# Patient Record
Sex: Female | Born: 1975 | Race: White | Hispanic: No | Marital: Married | Smoking: Never smoker
Health system: Southern US, Community
[De-identification: ages and names within clinical notes are randomized; demographics above are authoritative.]

## PROBLEM LIST (undated history)

## (undated) DIAGNOSIS — G43509 Persistent migraine aura without cerebral infarction, not intractable, without status migrainosus: Secondary | ICD-10-CM

## (undated) DIAGNOSIS — Z8669 Personal history of other diseases of the nervous system and sense organs: Secondary | ICD-10-CM

## (undated) DIAGNOSIS — T7840XA Allergy, unspecified, initial encounter: Secondary | ICD-10-CM

## (undated) DIAGNOSIS — I10 Essential (primary) hypertension: Secondary | ICD-10-CM

## (undated) DIAGNOSIS — F419 Anxiety disorder, unspecified: Secondary | ICD-10-CM

## (undated) HISTORY — DX: Anxiety disorder, unspecified: F41.9

## (undated) HISTORY — DX: Essential (primary) hypertension: I10

## (undated) HISTORY — DX: Allergy, unspecified, initial encounter: T78.40XA

## (undated) HISTORY — DX: Persistent migraine aura without cerebral infarction, not intractable, without status migrainosus: G43.509

---

## 1998-04-25 ENCOUNTER — Other Ambulatory Visit: Admission: RE | Admit: 1998-04-25 | Discharge: 1998-04-25 | Payer: Self-pay | Admitting: Gynecology

## 1999-01-15 ENCOUNTER — Inpatient Hospital Stay (HOSPITAL_COMMUNITY): Admission: AD | Admit: 1999-01-15 | Discharge: 1999-01-19 | Payer: Self-pay | Admitting: *Deleted

## 1999-04-03 ENCOUNTER — Other Ambulatory Visit: Admission: RE | Admit: 1999-04-03 | Discharge: 1999-04-03 | Payer: Self-pay | Admitting: Obstetrics and Gynecology

## 2000-04-24 ENCOUNTER — Other Ambulatory Visit: Admission: RE | Admit: 2000-04-24 | Discharge: 2000-04-24 | Payer: Self-pay | Admitting: Obstetrics and Gynecology

## 2001-04-03 ENCOUNTER — Other Ambulatory Visit: Admission: RE | Admit: 2001-04-03 | Discharge: 2001-04-03 | Payer: Self-pay | Admitting: Obstetrics and Gynecology

## 2002-02-13 ENCOUNTER — Emergency Department (HOSPITAL_COMMUNITY): Admission: EM | Admit: 2002-02-13 | Discharge: 2002-02-13 | Payer: Self-pay | Admitting: Emergency Medicine

## 2002-02-13 ENCOUNTER — Encounter: Payer: Self-pay | Admitting: Emergency Medicine

## 2002-04-06 ENCOUNTER — Other Ambulatory Visit: Admission: RE | Admit: 2002-04-06 | Discharge: 2002-04-06 | Payer: Self-pay | Admitting: Obstetrics and Gynecology

## 2003-04-15 ENCOUNTER — Other Ambulatory Visit: Admission: RE | Admit: 2003-04-15 | Discharge: 2003-04-15 | Payer: Self-pay | Admitting: Obstetrics and Gynecology

## 2003-10-07 ENCOUNTER — Emergency Department (HOSPITAL_COMMUNITY): Admission: EM | Admit: 2003-10-07 | Discharge: 2003-10-07 | Payer: Self-pay | Admitting: Emergency Medicine

## 2004-01-26 ENCOUNTER — Ambulatory Visit (HOSPITAL_COMMUNITY): Admission: RE | Admit: 2004-01-26 | Discharge: 2004-01-26 | Payer: Self-pay | Admitting: *Deleted

## 2004-07-12 ENCOUNTER — Other Ambulatory Visit: Admission: RE | Admit: 2004-07-12 | Discharge: 2004-07-12 | Payer: Self-pay | Admitting: Obstetrics and Gynecology

## 2005-02-01 ENCOUNTER — Emergency Department (HOSPITAL_COMMUNITY): Admission: EM | Admit: 2005-02-01 | Discharge: 2005-02-01 | Payer: Self-pay | Admitting: Emergency Medicine

## 2005-11-02 ENCOUNTER — Ambulatory Visit (HOSPITAL_COMMUNITY): Admission: RE | Admit: 2005-11-02 | Discharge: 2005-11-02 | Payer: Self-pay | Admitting: Emergency Medicine

## 2008-03-10 ENCOUNTER — Other Ambulatory Visit: Payer: Self-pay | Admitting: Emergency Medicine

## 2008-03-10 ENCOUNTER — Inpatient Hospital Stay (HOSPITAL_COMMUNITY): Admission: AD | Admit: 2008-03-10 | Discharge: 2008-03-10 | Payer: Self-pay | Admitting: Obstetrics and Gynecology

## 2008-04-06 ENCOUNTER — Ambulatory Visit: Payer: Self-pay | Admitting: Obstetrics and Gynecology

## 2008-04-06 ENCOUNTER — Inpatient Hospital Stay (HOSPITAL_COMMUNITY): Admission: AD | Admit: 2008-04-06 | Discharge: 2008-04-06 | Payer: Self-pay | Admitting: Obstetrics and Gynecology

## 2008-05-04 ENCOUNTER — Encounter: Admission: RE | Admit: 2008-05-04 | Discharge: 2008-05-04 | Payer: Self-pay | Admitting: Obstetrics and Gynecology

## 2008-05-14 ENCOUNTER — Inpatient Hospital Stay (HOSPITAL_COMMUNITY): Admission: AD | Admit: 2008-05-14 | Discharge: 2008-05-14 | Payer: Self-pay | Admitting: Obstetrics and Gynecology

## 2008-05-15 ENCOUNTER — Inpatient Hospital Stay (HOSPITAL_COMMUNITY): Admission: AD | Admit: 2008-05-15 | Discharge: 2008-05-15 | Payer: Self-pay | Admitting: Obstetrics and Gynecology

## 2008-06-02 ENCOUNTER — Inpatient Hospital Stay (HOSPITAL_COMMUNITY): Admission: AD | Admit: 2008-06-02 | Discharge: 2008-06-02 | Payer: Self-pay | Admitting: Obstetrics and Gynecology

## 2008-06-14 ENCOUNTER — Inpatient Hospital Stay (HOSPITAL_COMMUNITY): Admission: AD | Admit: 2008-06-14 | Discharge: 2008-06-18 | Payer: Self-pay | Admitting: Obstetrics and Gynecology

## 2008-06-15 ENCOUNTER — Encounter (INDEPENDENT_AMBULATORY_CARE_PROVIDER_SITE_OTHER): Payer: Self-pay | Admitting: Obstetrics & Gynecology

## 2008-06-20 ENCOUNTER — Inpatient Hospital Stay (HOSPITAL_COMMUNITY): Admission: AD | Admit: 2008-06-20 | Discharge: 2008-06-20 | Payer: Self-pay | Admitting: Obstetrics and Gynecology

## 2008-06-21 ENCOUNTER — Inpatient Hospital Stay (HOSPITAL_COMMUNITY): Admission: AD | Admit: 2008-06-21 | Discharge: 2008-06-22 | Payer: Self-pay | Admitting: Obstetrics and Gynecology

## 2008-06-21 ENCOUNTER — Encounter: Admission: RE | Admit: 2008-06-21 | Discharge: 2008-06-25 | Payer: Self-pay | Admitting: Obstetrics and Gynecology

## 2010-04-17 LAB — CBC
HCT: 30.6 % — ABNORMAL LOW (ref 36.0–46.0)
HCT: 36.8 % (ref 36.0–46.0)
Hemoglobin: 10.8 g/dL — ABNORMAL LOW (ref 12.0–15.0)
Hemoglobin: 11.3 g/dL — ABNORMAL LOW (ref 12.0–15.0)
Hemoglobin: 12.8 g/dL (ref 12.0–15.0)
MCHC: 34.7 g/dL (ref 30.0–36.0)
MCHC: 34.8 g/dL (ref 30.0–36.0)
MCHC: 35.2 g/dL (ref 30.0–36.0)
MCV: 84 fL (ref 78.0–100.0)
MCV: 84.4 fL (ref 78.0–100.0)
MCV: 84.9 fL (ref 78.0–100.0)
Platelets: 148 10*3/uL — ABNORMAL LOW (ref 150–400)
RBC: 3.89 MIL/uL (ref 3.87–5.11)
RDW: 16.6 % — ABNORMAL HIGH (ref 11.5–15.5)
RDW: 16.6 % — ABNORMAL HIGH (ref 11.5–15.5)
WBC: 7.4 10*3/uL (ref 4.0–10.5)

## 2010-04-20 LAB — COMPREHENSIVE METABOLIC PANEL
ALT: 17 U/L (ref 0–35)
AST: 35 U/L (ref 0–37)
Albumin: 2.8 g/dL — ABNORMAL LOW (ref 3.5–5.2)
Alkaline Phosphatase: 43 U/L (ref 39–117)
CO2: 26 mEq/L (ref 19–32)
Chloride: 107 mEq/L (ref 96–112)
Creatinine, Ser: 0.4 mg/dL (ref 0.4–1.2)
GFR calc Af Amer: >60 mL/min (ref >60)
GFR calc non Af Amer: >60 mL/min (ref >60)
Potassium: 4.3 mEq/L (ref 3.5–5.1)
Total Bilirubin: 0.9 mg/dL (ref 0.3–1.2)

## 2010-04-20 LAB — CBC
MCV: 84.5 fL (ref 78.0–100.0)
Platelets: 177 10*3/uL (ref 150–400)
RBC: 4.02 MIL/uL (ref 3.87–5.11)
WBC: 9.5 10*3/uL (ref 4.0–10.5)

## 2010-04-20 LAB — PROTIME-INR: Prothrombin Time: 14.5 seconds (ref 11.6–15.2)

## 2010-04-20 LAB — URINALYSIS, ROUTINE W REFLEX MICROSCOPIC
Glucose, UA: NEGATIVE mg/dL
Hgb urine dipstick: NEGATIVE
Ketones, ur: NEGATIVE mg/dL
Protein, ur: NEGATIVE mg/dL
pH: 7 (ref 5.0–8.0)

## 2010-04-20 LAB — DIFFERENTIAL
Basophils Absolute: 0 10*3/uL (ref 0.0–0.1)
Basophils Relative: 0 % (ref 0–1)
Eosinophils Absolute: 0.1 10*3/uL (ref 0.0–0.7)
Eosinophils Relative: 1 % (ref 0–5)
Monocytes Absolute: 0.5 10*3/uL (ref 0.1–1.0)

## 2010-04-20 LAB — ABO/RH: ABO/RH(D): AB NEG

## 2010-04-20 LAB — TYPE AND SCREEN: ABO/RH(D): AB NEG

## 2010-05-23 NOTE — Discharge Summary (Signed)
Kylie Galloway, Kylie Galloway                 ACCOUNT NO.:  [id number]   MEDICAL RECORD NO.:  [id number]          PATIENT TYPE:  INP   LOCATION:  9111                          FACILITY:  [facility]   PHYSICIAN:  [name], [name]   DATE OF BIRTH:  1975-05-01   DATE OF ADMISSION:  06/14/2008  DATE OF DISCHARGE:  06/18/2008                               DISCHARGE SUMMARY   ADMITTING DIAGNOSES:  1. Intrauterine pregnancy at 37-4/7 weeks estimated gestational age.  2. Chronic hypertension, currently on labetalol.  3. Gestational diabetes, diet controlled for large to gestational age      infant.   DISCHARGE DIAGNOSIS:  1. Status post low transverse cesarean section.  2. A viable female infant.   PROCEDURE:  Primary low transverse cesarean section.   REASON FOR ADMISSION:  Please see written H&P.   HOSPITAL COURSE:  The patient is a 35 year old white married female  gravida 4, para 2 that was admitted to [facility] at 37-  4/7 weeks estimated gestational age for an induction of labor.  The  patient's pregnancy had been complicated by chronic hypertension,  currently on labetalol 100 mg b.i.d.; gestational diabetes, which had  been diet controlled; and large for gestational age infant.  On  admission, vital signs were stable.  Cervix was dilated 2-3 cm, thick,  soft with vertex presentation at -2 station.  Artificial rupture of  membranes was performed which revealed clear fluid.  Fetal heart tones  were reactive.  The patient was started on Pitocin for augmentation of  her labor.  Later that evening, fetal heart tones were noted to have  some variable decelerations to the 40s and 50s with uterine  contractions.  Pitocin was discontinued.  Oxygen was administered.  Cervix was reexamined and found to be 5 cm completely effaced with  vertex at a -2 station.  Due to nonreassuring fetal heart tones and  arrest of dilatation, decision was made to proceed with a primary low  transverse  cesarean section.  The patient was then transferred to  operating room where epidural was dosed to an adequate surgical level.  A low transverse incision was made with delivery of a viable female infant  with Apgars of 7 at 1 minute and 9 at 5 minutes.  Arterial cord pH is  7.32.  The patient tolerated the procedure well and was taken to the  recovery room in stable condition.  On postoperative day #1, the patient  was without complaint.  Vital signs were stable.  Abdomen is soft.  Fundus firm and nontender.  Abdominal dressing noted to be clean, dry  and intact.  Laboratory findings showed hemoglobin of 10.8, platelet  count of 148,000.  Fasting blood sugar and 2-hour postprandial was  ordered for the following day.  On postoperative day #2, the patient was  without complaint.  Vital signs remained stable.  She was afebrile.  Blood pressure was 120/75.  Abdomen was soft.  Fundus firm and  nontender.  Incision was clean, dry and intact.  Fasting blood sugars 96  mg/dL.  On postoperative day #  3, the patient was without complaint.  Vital signs remained stable.  She was afebrile.  Fundus firm and  nontender.  Incision was clean, dry and intact.  Staples removed and the  patient was later discharged home.   CONDITION ON DISCHARGE:  Stable.   DIET:  Regular as tolerated.   ACTIVITY:  No heavy lifting, no driving x2 weeks, no vaginal entry.   FOLLOWUP:  The patient to follow up in the office in 1 week for an  incision check.  She is to call for temperature greater than 100  degrees, persistent nausea, vomiting, heavy vaginal bleeding and/or  redness or drainage from the incisional site.   DISCHARGE MEDICATIONS:  1. Tylox #30 one p.o. every 4-6 hours p.r.n.  2. Labetalol 100 mg 1 p.o. b.i.d.  3. Prenatal vitamins 1 p.o. daily.      Julio Sicks, N.P.      [name], [name]  Electronically Signed    CC/MEDQ  D:  06/18/2008  T:  06/18/2008  Job:  [id number]

## 2010-05-23 NOTE — Op Note (Signed)
Kylie Galloway, Kylie Galloway                 ACCOUNT NO.:  [id number]   MEDICAL RECORD NO.:  [id number]          PATIENT TYPE:  INP   LOCATION:  9111                          FACILITY:  [facility]   PHYSICIAN:  Guy Sandifer. Henderson Cloud, [name] DATE OF BIRTH:  [birth year]   DATE OF PROCEDURE:  06/15/2008  DATE OF DISCHARGE:                               OPERATIVE REPORT   PREOPERATIVE DIAGNOSES:  1. Intrauterine pregnancy at 37-4/7 weeks' estimated gestational age.  2. Nonreassuring fetal heart tracing.  3. Arrest of cervical dilation.   POSTOPERATIVE DIAGNOSES:  1. Intrauterine pregnancy at 37-4/7 weeks' estimated gestational age.  2. Nonreassuring fetal heart tracing.  3. Arrest of cervical dilation.   PROCEDURE:  Low-transverse cesarean section.   SURGEON:  Guy Sandifer. Henderson Cloud, MD.   ANESTHESIA:  Epidural by E.J. Oddono, MD   ESTIMATED BLOOD LOSS:  600 mL.   SPECIMENS:  Placenta to Pathology.   FINDINGS:  Viable female infant, Apgars of 7 and 9 and one and five  minutes respectively.  Birth weight pending.  Arterial cord pH 7.32.   INDICATIONS AND CONSENT:  This patient is a 35 year old married white  female G2, P2 with an EDC of July 04, 2008.  Prenatal care has been  complicated by chronic hypertension, on labetalol 100 mg b.i.d.;  gestational diabetes with good control; and a large-for-gestational age  fetus based on ultrasound last week consistent with approximately 8-1/2  pounds.  Group B beta strep culture is negative.  The patient is  admitted for 2-stage induction of labor.  Artificial rupture of  membranes for clear fluid is carried out.  At 1:20 p.m., the cervix was  3-4 cm dilated, 60% effaced, and minus 2 station.  IUPC was placed.  The  patient received an epidural and was on Pitocin augmentation.  Cervix  progressed to 5 cm dilation, complete effacement, and minus 2 station.  After several hours of good labor, cervix was without change or descent  of the vertex.  Diagnosis of arrest of  dilation was made.  At about the  same time, variable decelerations with contractions began.  These went  down to the 40s to 60s repetitively.  Position change and oxygen  administration failed to resolve these.  Pitocin was discontinued.  Recommendation for immediate cesarean section was made.  Potential risks  and complications were discussed with the patient and her husband  preoperatively including, but limited to, infection, organ damage,  bleeding requiring transfusion of blood products with HIV and hepatitis  acquisition, DVT, PE, and pneumonia.  All questions were answered and  consent was signed on the chart.   PROCEDURE:  The patient was taken to the operating room where she was  identified, placed in a dorsal supine position with a left lateral  wedge.  A Foley catheter was already in place.  Epidural anesthesia was  augmented to a surgical level.  She was prepped and draped in a sterile  fashion.  After testing for adequate epidural anesthesia, the skin was  entered through a Pfannenstiel incision and dissection was carried out  in layers to  the peritoneum.  The peritoneum was incised and extended  superiorly and inferiorly.  Vesicouterine peritoneum was taken down  cephalad laterally.  The bladder flap was developed.  The bladder blade  was placed.  Uterus was incised in a low transverse manner, and the  uterine cavity was entered bluntly with a hemostat.  The uterine  incision was extended cephalad laterally with the fingers.  Vertex was  delivered and the oral and nasopharynx were suctioned.  Nuchal cord x2  was noted and was reduced.  The baby was then delivered without  difficulty.  Good cry and tone was noted.  Cord was clamped and cut, and  the baby was handed to the waiting pediatrics team.  Placenta was  manually delivered and sent to Pathology.  Uterine cavity was cleaned.  Uterus was closed in 2 running locking imbricating layers of 0 Monocryl  suture.  Two  figure-of-eight 2-0 chromic sutures were placed on the  middle of the incision to obtain complete hemostasis.  Tubes and ovaries  were normal bilaterally.  Irrigation was carried out.  Anterior  peritoneum was closed in a running fashion with 0 Monocryl suture, which  was also used to reapproximate the pyramidalis muscle in the midline.  Anterior rectus fascia was closed in a running fashion with double-  locking 0 PDS suture.  Skin was closed with clips.      Guy Sandifer Henderson Cloud, [name]  Electronically Signed     JET/MEDQ  D:  06/15/2008  T:  06/16/2008  Job:  [id number]

## 2011-02-01 ENCOUNTER — Ambulatory Visit (INDEPENDENT_AMBULATORY_CARE_PROVIDER_SITE_OTHER): Payer: BC Managed Care – PPO

## 2011-02-01 DIAGNOSIS — L02818 Cutaneous abscess of other sites: Secondary | ICD-10-CM

## 2011-02-01 DIAGNOSIS — L241 Irritant contact dermatitis due to oils and greases: Secondary | ICD-10-CM

## 2011-02-01 DIAGNOSIS — R599 Enlarged lymph nodes, unspecified: Secondary | ICD-10-CM

## 2011-08-15 ENCOUNTER — Other Ambulatory Visit: Payer: Self-pay | Admitting: Obstetrics and Gynecology

## 2012-01-24 ENCOUNTER — Ambulatory Visit (INDEPENDENT_AMBULATORY_CARE_PROVIDER_SITE_OTHER): Payer: BC Managed Care – PPO | Admitting: Emergency Medicine

## 2012-01-24 VITALS — BP 130/84 | HR 106 | Temp 99.2°F | Resp 16 | Ht 64.0 in | Wt 163.0 lb

## 2012-01-24 DIAGNOSIS — I10 Essential (primary) hypertension: Secondary | ICD-10-CM | POA: Insufficient documentation

## 2012-01-24 DIAGNOSIS — Z20828 Contact with and (suspected) exposure to other viral communicable diseases: Secondary | ICD-10-CM

## 2012-01-24 DIAGNOSIS — J029 Acute pharyngitis, unspecified: Secondary | ICD-10-CM

## 2012-01-24 LAB — POCT RAPID STREP A (OFFICE): Rapid Strep A Screen: NEGATIVE

## 2012-01-24 NOTE — Progress Notes (Signed)
[facility] [address], [place] [address] [phone]  Date:  01/24/2012   Name:  Kylie Galloway   DOB:  28-Sep-1975   MRN:  [id number]  PCP:  No primary provider on file.    Chief Complaint: Sore Throat   History of Present Illness:  Kylie Galloway is a 37 y.o. very pleasant female patient who presents with the following:  Daughter treated last night for flu and she now has sore throat and has had a fever and chills. No myalgias or arthralgias.  Some malaise.  Headache.  Ill for 4 days.  Has a non productive cough.  No improvement with OTC meds.  No flu shot.  Patient Active Problem List  Diagnosis  . Hypertension    Past Medical History  Diagnosis Date  . Hypertension     Past Surgical History  Procedure Date  . Cesarean section     History  Substance Use Topics  . Smoking status: Never Smoker   . Smokeless tobacco: Not on file  . Alcohol Use: Not on file    History reviewed. No pertinent family history.  No Known Allergies  Medication list has been reviewed and updated.  Current Outpatient Prescriptions on File Prior to Visit  Medication Sig Dispense Refill  . bisoprolol-hydrochlorothiazide (ZIAC) 10-6.25 MG per tablet Take 1 tablet by mouth daily.      Marland Kitchen desogestrel-ethinyl estradiol (APRI,EMOQUETTE,SOLIA) 0.15-30 MG-MCG tablet Take 1 tablet by mouth daily.        Review of Systems:  As per HPI, otherwise negative.    Physical Examination: Filed Vitals:   01/24/12 1330  BP: 130/84  Pulse: 106  Temp: 99.2 F (37.3 C)  Resp: 16   Filed Vitals:   01/24/12 1330  Height: 5\' 4"  (1.626 m)  Weight: 163 lb (73.936 kg)   Body mass index is 27.98 kg/(m^2). Ideal Body Weight: Weight in (lb) to have BMI = 25: 145.3   GEN: WDWN, NAD, Non-toxic, A & O x 3 HEENT: Atraumatic, Normocephalic. Neck supple. No masses, No LAD. Ears and Nose: No external deformity. CV: RRR, No M/G/R. No JVD. No thrill. No extra heart sounds. PULM:  CTA B, no wheezes, crackles, rhonchi. No retractions. No resp. distress. No accessory muscle use. ABD: S, NT, ND, +BS. No rebound. No HSM. EXTR: No c/c/e NEURO Normal gait.  PSYCH: Normally interactive. Conversant. Not depressed or anxious appearing.  Calm demeanor.    Assessment and Plan: Pharyngitis Flu exposure Continue tamiflu  Carmelina Dane, MD  Results for orders placed in visit on 01/24/12  POCT RAPID STREP A (OFFICE)      Component Value Range   Rapid Strep A Screen Negative  Negative

## 2012-01-24 NOTE — Patient Instructions (Signed)

## 2012-09-15 ENCOUNTER — Ambulatory Visit (INDEPENDENT_AMBULATORY_CARE_PROVIDER_SITE_OTHER): Payer: BC Managed Care – PPO | Admitting: Internal Medicine

## 2012-09-15 VITALS — BP 122/84 | HR 97 | Temp 99.5°F | Resp 18 | Ht 65.0 in | Wt 152.0 lb

## 2012-09-15 DIAGNOSIS — J019 Acute sinusitis, unspecified: Secondary | ICD-10-CM

## 2012-09-15 MED ORDER — AMOXICILLIN 875 MG PO TABS: 875.0000 mg | ORAL_TABLET | Freq: Two times a day (BID) | ORAL | Status: DC

## 2012-09-16 NOTE — Progress Notes (Signed)
  Subjective:    Patient ID: Kylie Galloway, female    DOB: [birth year], 37 y.o.   MRN: [id number]  HPIcongestion and sinus pain 1 week after she and husband caught virus at start of school or trip to beach just before Fever low grade  Purulent PND today-getting worse No cough  Patient Active Problem List   Diagnosis Date Noted  . Hypertension 01/24/2012   Current outpatient prescriptions:bisoprolol-hydrochlorothiazide (ZIAC) 10-6.25 MG per tablet, Take 1 tablet by mouth daily., Disp: , Rfl: ;  desogestrel-ethinyl estradiol (APRI,EMOQUETTE,SOLIA) 0.15-30 MG-MCG tablet, Take 1 tablet by mouth daily., Disp: , Rfl: ;  amoxicillin (AMOXIL) 875 MG tablet, Take 1 tablet (875 mg total) by mouth 2 (two) times daily., Disp: 20 tablet, Rfl: 0   Review of Systems neg    Objective:   Physical Exam BP 122/84  Pulse 97  Temp(Src) 99.5 F (37.5 C) (Oral)  Resp 18  Ht 5\' 5"  (1.651 m)  Wt 152 lb (68.947 kg)  BMI 25.29 kg/m2  SpO2 100%  LMP 09/10/2012 Conj cl tms cl Nares boggy-pur d/c Tend max to per thr cl No nodes lungs cl       Assessment & Plan:  Sinusitis  Meds ordered this encounter  Medications  . amoxicillin (AMOXIL) 875 MG tablet    Sig: Take 1 tablet (875 mg total) by mouth 2 (two) times daily.    Dispense:  20 tablet    Refill:  0

## 2012-09-17 ENCOUNTER — Other Ambulatory Visit: Payer: Self-pay | Admitting: Obstetrics and Gynecology

## 2013-01-04 ENCOUNTER — Ambulatory Visit (INDEPENDENT_AMBULATORY_CARE_PROVIDER_SITE_OTHER): Payer: BC Managed Care – PPO | Admitting: Physician Assistant

## 2013-01-04 VITALS — BP 120/78 | HR 113 | Temp 100.5°F | Resp 20 | Ht 64.0 in | Wt 162.0 lb

## 2013-01-04 DIAGNOSIS — R059 Cough, unspecified: Secondary | ICD-10-CM

## 2013-01-04 DIAGNOSIS — J329 Chronic sinusitis, unspecified: Secondary | ICD-10-CM

## 2013-01-04 DIAGNOSIS — R05 Cough: Secondary | ICD-10-CM

## 2013-01-04 DIAGNOSIS — R509 Fever, unspecified: Secondary | ICD-10-CM

## 2013-01-04 LAB — POCT CBC
Granulocyte percent: 70.1 %G (ref 37–80)
HCT, POC: 42.2 % (ref 37.7–47.9)
Hemoglobin: 13.3 g/dL (ref 12.2–16.2)
Lymph, poc: 0.6 (ref 0.6–3.4)
MCH, POC: 26.9 pg — AB (ref 27–31.2)
MCHC: 31.5 g/dL — AB (ref 31.8–35.4)
MCV: 85.4 fL (ref 80–97)
MID (cbc): 0.3 (ref 0–0.9)
MPV: 9.2 fL (ref 0–99.8)
POC Granulocyte: 2.2 (ref 2–6.9)
POC LYMPH PERCENT: 19.5 %L (ref 10–50)
POC MID %: 10.4 %M (ref 0–12)
Platelet Count, POC: 170 10*3/uL (ref 142–424)
RBC: 4.94 M/uL (ref 4.04–5.48)
RDW, POC: 14.8 %
WBC: 3.2 10*3/uL — AB (ref 4.6–10.2)

## 2013-01-04 LAB — POCT INFLUENZA A/B
Influenza A, POC: NEGATIVE
Influenza B, POC: NEGATIVE

## 2013-01-04 MED ORDER — OSELTAMIVIR PHOSPHATE 75 MG PO CAPS
75.0000 mg | ORAL_CAPSULE | Freq: Two times a day (BID) | ORAL | Status: DC
Start: 2013-01-04 — End: 2013-09-24

## 2013-01-04 MED ORDER — HYDROCOD POLST-CHLORPHEN POLST 10-8 MG/5ML PO LQCR: 5.0000 mL | Freq: Two times a day (BID) | ORAL | Status: DC | PRN

## 2013-01-04 MED ORDER — AMOXICILLIN-POT CLAVULANATE 875-125 MG PO TABS: 1.0000 | ORAL_TABLET | Freq: Two times a day (BID) | ORAL | Status: DC

## 2013-01-04 NOTE — Progress Notes (Signed)
Subjective:    Patient ID: Kylie Galloway, female    DOB: 09/22/1975, 37 y.o.   MRN: [id number]  HPI 37 year old female presents for evaluation of acute onset of fever, chills, body aches, myalgias, and URI sx's.  States fever started suddenly today. Admits she has had about 1 week of progressively worsening nasal congestion, sinus pain, PND, sore throat, and headache.  Hx of sinus infections that feel similarly to this.  She has been taking OTC cold medicines that have not been helping much. Got acutely worse today and came in for evaluation. Has slight cough. Denies chest pain, SOB, wheezing, or hemoptysis.  Patient is otherwise doing well with no other concerns today.     Review of Systems  Constitutional: Positive for fever, chills and fatigue.  HENT: Positive for congestion, postnasal drip, rhinorrhea, sinus pressure and sore throat. Negative for ear pain.   Respiratory: Positive for cough. Negative for shortness of breath and wheezing.   Cardiovascular: Negative for chest pain.  Gastrointestinal: Negative for nausea and vomiting.  Neurological: Positive for headaches (sinus headache). Negative for dizziness.       Objective:   Physical Exam  Constitutional: She is oriented to person, place, and time. She appears well-developed and well-nourished.  HENT:  Head: Normocephalic and atraumatic.  Right Ear: Hearing, tympanic membrane, external ear and ear canal normal.  Left Ear: Hearing, tympanic membrane, external ear and ear canal normal.  Nose: Right sinus exhibits maxillary sinus tenderness and frontal sinus tenderness. Left sinus exhibits maxillary sinus tenderness and frontal sinus tenderness.  Mouth/Throat: Uvula is midline, oropharynx is clear and moist and mucous membranes are normal. No oropharyngeal exudate or posterior oropharyngeal erythema.  Neck: Normal range of motion. Neck supple.  Cardiovascular: Normal rate, regular rhythm and normal heart sounds.   Pulmonary/Chest:  Effort normal and breath sounds normal.  Lymphadenopathy:    She has no cervical adenopathy.  Neurological: She is alert and oriented to person, place, and time.  Psychiatric: She has a normal mood and affect. Her behavior is normal. Judgment and thought content normal.      Results for orders placed in visit on 01/04/13  POCT INFLUENZA A/B      Result Value Range   Influenza A, POC Negative     Influenza B, POC Negative    POCT CBC      Result Value Range   WBC 3.2 (*) 4.6 - 10.2 K/uL   Lymph, poc 0.6  0.6 - 3.4   POC LYMPH PERCENT 19.5  10 - 50 %L   MID (cbc) 0.3  0 - 0.9   POC MID % 10.4  0 - 12 %M   POC Granulocyte 2.2  2 - 6.9   Granulocyte percent 70.1  37 - 80 %G   RBC 4.94  4.04 - 5.48 M/uL   Hemoglobin 13.3  12.2 - 16.2 g/dL   HCT, POC 40.9  81.1 - 47.9 %   MCV 85.4  80 - 97 fL   MCH, POC 26.9 (*) 27 - 31.2 pg   MCHC 31.5 (*) 31.8 - 35.4 g/dL   RDW, POC 91.4     Platelet Count, POC 170  142 - 424 K/uL   MPV 9.2  0 - 99.8 fL        Assessment & Plan:  Fever, unspecified - Plan: POCT Influenza A/B  Sinusitis - Plan: POCT CBC, amoxicillin-clavulanate (AUGMENTIN) 875-125 MG per tablet  Cough - Plan: chlorpheniramine-HYDROcodone (TUSSIONEX PENNKINETIC  ER) 10-8 MG/5ML LQCR  Likely influenza on sinus infection. Will treat for both Start Tamiflu 75 mg bid x 5 days.  Augmentin 875 mg bid x 10 days Increase fluids and rest rx for Tussionex if needed Follow up if symptoms worsen or fail to improve.

## 2013-02-01 ENCOUNTER — Emergency Department (HOSPITAL_COMMUNITY)
Admission: EM | Admit: 2013-02-01 | Discharge: 2013-02-02 | Disposition: A | Discharge status: Home / Self Care | Payer: BC Managed Care – PPO | Attending: Emergency Medicine | Admitting: Emergency Medicine

## 2013-02-01 ENCOUNTER — Encounter (HOSPITAL_COMMUNITY): Payer: Self-pay | Admitting: Emergency Medicine

## 2013-02-01 ENCOUNTER — Emergency Department (HOSPITAL_COMMUNITY): Payer: BC Managed Care – PPO

## 2013-02-01 DIAGNOSIS — R42 Dizziness and giddiness: Secondary | ICD-10-CM

## 2013-02-01 DIAGNOSIS — Z8669 Personal history of other diseases of the nervous system and sense organs: Secondary | ICD-10-CM | POA: Insufficient documentation

## 2013-02-01 DIAGNOSIS — Z79899 Other long term (current) drug therapy: Secondary | ICD-10-CM | POA: Insufficient documentation

## 2013-02-01 DIAGNOSIS — I10 Essential (primary) hypertension: Secondary | ICD-10-CM | POA: Insufficient documentation

## 2013-02-01 DIAGNOSIS — R519 Headache, unspecified: Secondary | ICD-10-CM

## 2013-02-01 DIAGNOSIS — R51 Headache: Secondary | ICD-10-CM | POA: Insufficient documentation

## 2013-02-01 HISTORY — DX: Personal history of other diseases of the nervous system and sense organs: Z86.69

## 2013-02-01 MED ORDER — SODIUM CHLORIDE 0.9 % IV BOLUS (SEPSIS)
1000.0000 mL | Freq: Once | INTRAVENOUS | Status: AC
  Administered 2013-02-01: 1000 mL via INTRAVENOUS

## 2013-02-01 MED ORDER — DIPHENHYDRAMINE HCL 50 MG/ML IJ SOLN
25.0000 mg | Freq: Once | INTRAMUSCULAR | Status: AC
  Administered 2013-02-01: 25 mg via INTRAVENOUS
  Filled 2013-02-01: qty 1

## 2013-02-01 MED ORDER — MECLIZINE HCL 25 MG PO TABS
25.0000 mg | ORAL_TABLET | Freq: Once | ORAL | Status: AC
  Administered 2013-02-01: 25 mg via ORAL
  Filled 2013-02-01: qty 1

## 2013-02-01 MED ORDER — MECLIZINE HCL 25 MG PO TABS: 25.0000 mg | ORAL_TABLET | Freq: Three times a day (TID) | ORAL | Status: DC | PRN

## 2013-02-01 MED ORDER — METOCLOPRAMIDE HCL 5 MG/ML IJ SOLN
10.0000 mg | Freq: Once | INTRAMUSCULAR | Status: AC
  Administered 2013-02-01: 10 mg via INTRAVENOUS
  Filled 2013-02-01: qty 2

## 2013-02-01 NOTE — Discharge Instructions (Signed)
Return to the ED with any concerns including vomiting, changes in vision or speech, weakness in arms or legs, decreased level of alerrtness/lethargy, or any other alarming symptoms

## 2013-02-01 NOTE — ED Provider Notes (Signed)
CSN: [id number]     Arrival date & time 02/01/13  1805 History   First MD Initiated Contact with Patient 02/01/13 2056     Chief Complaint  Patient presents with  . Headache  . Dizziness   (Consider location/radiation/quality/duration/timing/severity/associated sxs/prior Treatment) HPI Pt presenting with c/o headache.  She states headache came on while she was doing her pilates routine and that the headache has been graduallly worsening since then.  Pain is constant and throbbing.  Pain worse in frontal region bilaterally.  No changes in vision or speech.  No focal weakness.  No vomiting.  No fevers or neck pain.  She has not tried anything for her headache prior to arrival.  No syncope. Pt also states that she feels sensation of vertigo.  States she has had several prior episodes of vertgo and this feels similar although it is usually not associated with headache.  Vertigo is worse with turning her head and movement in general.  There are no other associated systemic symptoms, there are no other alleviating or modifying factors.   Past Medical History  Diagnosis Date  . Hypertension   . Allergy   . H/O Bell's palsy     rt sided   Past Surgical History  Procedure Laterality Date  . Cesarean section     No family history on file. History  Substance Use Topics  . Smoking status: Never Smoker   . Smokeless tobacco: Never Used  . Alcohol Use: No   OB History   Grav Para Term Preterm Abortions TAB SAB Ect Mult Living                 Review of Systems ROS reviewed and all otherwise negative except for mentioned in HPI  Allergies  Review of patient's allergies indicates no known allergies.  Home Medications   Current Outpatient Rx  Name  Route  Sig  Dispense  Refill  . bisoprolol-hydrochlorothiazide (ZIAC) 10-6.25 MG per tablet   Oral   Take 1 tablet by mouth daily.         Marland Kitchen. desogestrel-ethinyl estradiol (APRI,EMOQUETTE,SOLIA) 0.15-30 MG-MCG tablet   Oral   Take 1  tablet by mouth daily.         . meclizine (ANTIVERT) 25 MG tablet   Oral   Take 1 tablet (25 mg total) by mouth 3 (three) times daily as needed for dizziness.   30 tablet   0   . oseltamivir (TAMIFLU) 75 MG capsule   Oral   Take 1 capsule (75 mg total) by mouth 2 (two) times daily.   10 capsule   0    BP 119/69  Pulse 74  Temp(Src) 97.4 F (36.3 C) (Oral)  Resp 17  Ht 5\' 5"  (1.651 m)  Wt 155 lb (70.308 kg)  BMI 25.79 kg/m2  SpO2 99%  LMP 01/01/2013 Vitals reviewed Physical Exam Physical Examination: General appearance - alert, well appearing, and in no distress Mental status - alert, oriented to person, place, and time Eyes - pupils equal and reactive, extraocular eye movements intact Mouth - mucous membranes moist, pharynx normal without lesions Chest - clear to auscultation, no wheezes, rales or rhonchi, symmetric air entry Heart - normal rate, regular rhythm, normal S1, S2, no murmurs, rubs, clicks or gallops Abdomen - soft, nontender, nondistended, no masses or organomegaly Neurological - alert, oriented x 3, cranial nerves 2-12 tested and intact, strength 5/5 in extremities x 4, sensation intact Extremities - peripheral pulses normal, no pedal edema, no  clubbing or cyanosis Skin - normal coloration and turgor, no rashes  ED Course  Procedures (including critical care time)  11:39 PM on recheck patient feels much improved after IV meds and fluids.  Head CT reassuring.  Labs Review Labs Reviewed - No data to display Imaging Review No results found.  EKG Interpretation    Date/Time:  Sunday February 01 2013 22:14:33 EST Ventricular Rate:  84 PR Interval:  136 QRS Duration: 99 QT Interval:  377 QTC Calculation: 446 R Axis:   32 Text Interpretation:  Sinus rhythm RSR' in V1 or V2, right VCD or RVH No old tracing to compare Confirmed by [facility]  MD, Ireoluwa Gorsline [phone]) on 02/01/2013 10:36:43 PM            MDM   1. Headache   2. Vertigo    Pt presenting  with c/o headache and vertigo.  She has hx of prior episodes of peripheral vertigo and this feels similar.  HA has been gradually worsening, no neurologic findings on exam.  Pt feels improved after migraine cocktail and meclizine.  Head CT reassuring.  Doubt stroke, doubt SAH.  Discharged with strict return precautions.  Pt agreeable with plan.    Ethelda Chick, MD 02/04/13 [phone]

## 2013-02-01 NOTE — ED Notes (Signed)
Pt reports HA stated during a work out at gym. Pt reports HA sudden on set worst pain located at fore head.. Pt reports a HX of HTN with meds.

## 2013-02-21 ENCOUNTER — Ambulatory Visit (INDEPENDENT_AMBULATORY_CARE_PROVIDER_SITE_OTHER): Payer: BC Managed Care – PPO | Admitting: Internal Medicine

## 2013-02-21 VITALS — BP 110/68 | HR 75 | Temp 98.7°F | Resp 18 | Ht 64.25 in | Wt 162.0 lb

## 2013-02-21 DIAGNOSIS — J019 Acute sinusitis, unspecified: Secondary | ICD-10-CM

## 2013-02-21 MED ORDER — AMOXICILLIN 500 MG PO CAPS: 1000.0000 mg | ORAL_CAPSULE | Freq: Two times a day (BID) | ORAL | Status: AC

## 2013-02-21 NOTE — Progress Notes (Signed)
This chart was scribed for [name], MD by Luisa Dago, ED Scribe. This patient was seen in room 8 and the patient's care was started at 10:56 AM. Subjective:    Patient ID: Kylie Galloway, female    DOB: 08-17-1975, 38 y.o.   MRN: [id number]  Chief Complaint  Patient presents with  . Facial Pain     x 7-10 days  . Sinusitis    drainage & pressure    HPI HPI Comments: Kylie Galloway is a 38 y.o. female who presents to [facility] complaining of Sinus infection. Pt states that her symptoms started like allergy symptoms. Pt is also complaining of associated facial pain. Pt denies taking any OTC medication to alleviate her symptoms. Denies any fever, chills, diaphoresis, or nausea.      Patient Active Problem List   Diagnosis Date Noted  . Hypertension 01/24/2012   Past Medical History  Diagnosis Date  . Hypertension   . Allergy   . H/O Bell's palsy     rt sided   Past Surgical History  Procedure Laterality Date  . Cesarean section     No Known Allergies Prior to Admission medications   Medication Sig Start Date End Date Taking? Authorizing Provider  bisoprolol-hydrochlorothiazide (ZIAC) 10-6.25 MG per tablet Take 1 tablet by mouth daily.    Historical Provider, MD  desogestrel-ethinyl estradiol (APRI,EMOQUETTE,SOLIA) 0.15-30 MG-MCG tablet Take 1 tablet by mouth daily.    Historical Provider, MD  meclizine (ANTIVERT) 25 MG tablet Take 1 tablet (25 mg total) by mouth 3 (three) times daily as needed for dizziness. 02/01/13   Ethelda Chick, MD  oseltamivir (TAMIFLU) 75 MG capsule Take 1 capsule (75 mg total) by mouth 2 (two) times daily. 01/04/13   Nelva Nay, PA-C   History   Social History  . Marital Status: Married    Spouse Name: N/A    Number of Children: N/A  . Years of Education: N/A   Occupational History  . Not on file.   Social History Main Topics  . Smoking status: Never Smoker   . Smokeless tobacco: Never Used  . Alcohol  Use: No  . Drug Use: No  . Sexual Activity: Not on file   Other Topics Concern  . Not on file   Social History Narrative  . No narrative on file   Review of Systems No fever chills or night sweats No headaches The shortness of breath -no cough    Objective:   Physical Exam  Nursing note and vitals reviewed. Constitutional: She is oriented to person, place, and time. She appears well-developed and well-nourished. No distress.  HENT:  Head: Normocephalic and atraumatic.  Right Ear: External ear normal.  Left Ear: External ear normal.  Purulent discharge on the left.  Eyes: EOM are normal.  Neck: Neck supple. No thyromegaly present.  Cardiovascular: Normal rate.   Pulmonary/Chest: Effort normal. No respiratory distress.  Musculoskeletal: Normal range of motion.  Neurological: She is alert and oriented to person, place, and time.  Skin: Skin is warm and dry.  Psychiatric: She has a normal mood and affect. Her behavior is normal.     Filed Vitals:   02/21/13 1021  BP: 110/68  Pulse: 75  Temp: 98.7 F (37.1 C)  TempSrc: Oral  Resp: 18  Height: 5' 4.25" (1.632 m)  Weight: 162 lb (73.483 kg)  SpO2: 100%        Assessment & Plan:  Acute sinusitis, unspecified  Meds ordered this encounter  Medications  . amoxicillin (AMOXIL) 500 MG capsule    Sig: Take 2 capsules (1,000 mg total) by mouth 2 (two) times daily.    Dispense:  40 capsule    Refill:  0   Sudafed if needed   I have completed the patient encounter in its entirety as documented by the scribe, with editing by me where necessary. Robert P. Merla Richesoolittle, [name]

## 2013-09-21 ENCOUNTER — Other Ambulatory Visit: Payer: Self-pay | Admitting: Obstetrics and Gynecology

## 2013-09-22 LAB — CYTOLOGY - PAP

## 2013-09-24 ENCOUNTER — Ambulatory Visit (INDEPENDENT_AMBULATORY_CARE_PROVIDER_SITE_OTHER): Payer: BC Managed Care – PPO | Admitting: Family Medicine

## 2013-09-24 VITALS — BP 126/92 | HR 83 | Temp 97.8°F | Resp 16 | Ht 64.0 in | Wt 167.0 lb

## 2013-09-24 DIAGNOSIS — J0100 Acute maxillary sinusitis, unspecified: Secondary | ICD-10-CM

## 2013-09-24 MED ORDER — AMOXICILLIN 875 MG PO TABS: 875.0000 mg | ORAL_TABLET | Freq: Two times a day (BID) | ORAL | Status: DC

## 2013-09-24 NOTE — Progress Notes (Signed)
Patient ID: Kylie Galloway MRN: [id number], DOB: 1975-04-22, 38 y.o. Date of Encounter: 09/24/2013, 5:07 PM  Primary Physician: Lucilla Edin, MD  Chief Complaint:  Chief Complaint  Patient presents with  . Sore Throat  . Sinus Problem    HPI: 38 y.o. year old female presents with 8-10 day history of nasal congestion, post nasal drip, sore throat, sinus pressure, and cough. Afebrile. No chills. Nasal congestion thick and green/yellow. Sinus pressure is the worst symptom. Cough is productive secondary to post nasal drip and not associated with time of day. Ears feel full, leading to sensation of muffled hearing. Has tried OTC cold preps without success. No GI complaints.   No recent antibiotics, recent travels, vomiting, or sick contacts   No leg trauma, sedentary periods, h/o cancer, or tobacco use.  Past Medical History  Diagnosis Date  . Hypertension   . Allergy   . H/O Bell's palsy     rt sided     Home Meds: Prior to Admission medications   Medication Sig Start Date End Date Taking? Authorizing Provider  bisoprolol-hydrochlorothiazide (ZIAC) 10-6.25 MG per tablet Take 1 tablet by mouth daily.   Yes Historical Provider, MD  desogestrel-ethinyl estradiol (APRI,EMOQUETTE,SOLIA) 0.15-30 MG-MCG tablet Take 1 tablet by mouth daily.   Yes Historical Provider, MD    Allergies: No Known Allergies  History   Social History  . Marital Status: Married    Spouse Name: N/A    Number of Children: N/A  . Years of Education: N/A   Occupational History  . Not on file.   Social History Main Topics  . Smoking status: Never Smoker   . Smokeless tobacco: Never Used  . Alcohol Use: No  . Drug Use: No  . Sexual Activity: Not on file   Other Topics Concern  . Not on file   Social History Narrative  . No narrative on file     Review of Systems: Constitutional: negative for chills, fever, night sweats or weight changes Cardiovascular: negative for chest pain or  palpitations Respiratory: negative for hemoptysis, wheezing, or shortness of breath Abdominal: negative for abdominal pain, nausea, vomiting or diarrhea Dermatological: negative for rash Neurologic: negative for headache   Physical Exam: Blood pressure 126/92, pulse 83, temperature 97.8 F (36.6 C), temperature source Oral, resp. rate 16, height  (1.626 m), weight 167 lb (75.751 kg), last menstrual period 09/03/2013, SpO2 100.00%., Body mass index is 28.65 kg/(m^2). General: Well developed, well nourished, in no acute distress. Head: Normocephalic, atraumatic, eyes without discharge, sclera non-icteric, nares are congested. Bilateral auditory canals clear, TM's are without perforation, pearly grey with reflective cone of light bilaterally. Serous effusion bilaterally behind TM's. Maxillary sinus TTP. Oral cavity moist, dentition normal. Posterior pharynx with post nasal drip and mild erythema. No peritonsillar abscess or tonsillar exudate. Neck: Supple. No thyromegaly. Full ROM. No lymphadenopathy. Lungs: Clear bilaterally to auscultation without wheezes, rales, or rhonchi. Breathing is unlabored.  Heart: RRR with S1 S2. No murmurs, rubs, or gallops appreciated. Msk:  Strength and tone normal for age. Extremities: No clubbing or cyanosis. No edema. Neuro: Alert and oriented X 3. Moves all extremities spontaneously. CNII-XII grossly in tact. Psych:  Responds to questions appropriately with a normal affect.     ASSESSMENT AND PLAN:  38 y.o. year old female with sinusitis -Acute maxillary sinusitis, recurrence not specified - Plan: amoxicillin (AMOXIL) 875 MG tablet    -Tylenol/Motrin prn -Rest/fluids -RTC precautions -RTC 3-5 days if no improvement  Signed, Elvina Sidle, MD 09/24/2013 5:07 PM

## 2013-09-24 NOTE — Patient Instructions (Signed)

## 2014-02-20 ENCOUNTER — Ambulatory Visit (INDEPENDENT_AMBULATORY_CARE_PROVIDER_SITE_OTHER): Payer: BC Managed Care – PPO | Admitting: Physician Assistant

## 2014-02-20 VITALS — BP 120/78 | HR 94 | Temp 97.8°F | Ht 64.5 in | Wt 180.8 lb

## 2014-02-20 DIAGNOSIS — G5603 Carpal tunnel syndrome, bilateral upper limbs: Secondary | ICD-10-CM

## 2014-02-20 DIAGNOSIS — J019 Acute sinusitis, unspecified: Secondary | ICD-10-CM

## 2014-02-20 DIAGNOSIS — G5602 Carpal tunnel syndrome, left upper limb: Secondary | ICD-10-CM

## 2014-02-20 DIAGNOSIS — G5601 Carpal tunnel syndrome, right upper limb: Secondary | ICD-10-CM

## 2014-02-20 MED ORDER — AZITHROMYCIN 250 MG PO TABS: ORAL_TABLET | ORAL | Status: DC

## 2014-02-20 MED ORDER — IBUPROFEN 600 MG PO TABS: 600.0000 mg | ORAL_TABLET | Freq: Three times a day (TID) | ORAL | Status: DC | PRN

## 2014-02-20 NOTE — Progress Notes (Signed)
Subjective:    Patient ID: Kylie Galloway, female    DOB: March 04, 1975, 39 y.o.   MRN: [id number]  HPI Patient presents for numbness of hands and arms and sinus pressure. Numbness has been present for over 2 weeks and is progressively getting worse. Denies pain, difficulty grasping, dropping things, loss of fxn/ROM/sensation. Pain and stiffness felt worse in the morning and gets better after rubbing hands for 3 or so minutes. Was diagnosed with pregnancy induced carpal tunnel in left hand 5 years ago which resolved, but worse brace while having trouble.   Sinus pressure and congestion present for 2 1/2 weeks. Endorses HA and pain in teeth. Has had some rhinorrhea and mild cough, but denies fever, sore throat, ear pressure, and fatigue. Has not tried any medication for relief. Has seasonal allergies, but no history of asthma.    Review of Systems  Constitutional: Positive for fatigue. Negative for fever, chills, activity change and appetite change.  HENT: Positive for congestion, dental problem, postnasal drip and sinus pressure. Negative for ear discharge, ear pain, rhinorrhea, sneezing and sore throat.   Eyes: Negative.   Respiratory: Positive for cough. Negative for chest tightness and wheezing.   Cardiovascular: Negative for chest pain.  Gastrointestinal: Negative for nausea, vomiting and abdominal pain.  Musculoskeletal: Positive for myalgias. Negative for back pain, joint swelling, arthralgias, neck pain and neck stiffness.  Allergic/Immunologic: Positive for environmental allergies. Negative for food allergies.  Neurological: Positive for headaches. Negative for dizziness and light-headedness.  Hematological: Negative for adenopathy.       Objective:   Physical Exam  Constitutional: She is oriented to person, place, and time. She appears well-developed and well-nourished. No distress.  Blood pressure 120/78, pulse 94, temperature 97.8 F (36.6 C), temperature source Oral, height 5'  4.5" (1.638 m), weight 180 lb 12.8 oz (82.01 kg), last menstrual period 02/15/2014, SpO2 99 %.  HENT:  Head: Normocephalic and atraumatic.  Right Ear: Tympanic membrane, external ear and ear canal normal.  Left Ear: Tympanic membrane, external ear and ear canal normal.  Nose: Rhinorrhea (with mild erythema) present. Right sinus exhibits maxillary sinus tenderness. Right sinus exhibits no frontal sinus tenderness. Left sinus exhibits maxillary sinus tenderness. Left sinus exhibits no frontal sinus tenderness.  Mouth/Throat: Uvula is midline, oropharynx is clear and moist and mucous membranes are normal. No oropharyngeal exudate.  Eyes: Conjunctivae are normal. Pupils are equal, round, and reactive to light. Right eye exhibits no discharge. Left eye exhibits no discharge. No scleral icterus.  Neck: Neck supple. No thyromegaly present.  Cardiovascular: Normal rate, regular rhythm and normal heart sounds.  Exam reveals no gallop and no friction rub.   No murmur heard. Pulmonary/Chest: Effort normal and breath sounds normal. No respiratory distress. She has no wheezes. She has no rales. She exhibits no tenderness.  Musculoskeletal: Normal range of motion. She exhibits no edema or tenderness.       Right upper arm: Normal.       Left upper arm: Normal.       Right hand: She exhibits normal range of motion, no tenderness, no bony tenderness and no deformity. Normal sensation noted. Normal strength noted.       Left hand: She exhibits normal range of motion, no tenderness, no bony tenderness, no deformity and no swelling. Normal sensation noted. Normal strength noted.  Positive phalen's test  Lymphadenopathy:    She has cervical adenopathy.  Neurological: She is alert and oriented to person, place, and time. She has  normal strength and normal reflexes. She displays no atrophy. No cranial nerve deficit or sensory deficit. She exhibits normal muscle tone. Coordination normal.  Skin: Skin is warm and dry.  No rash noted. She is not diaphoretic. No erythema. No pallor.       Assessment & Plan:  1. Bilateral carpal tunnel syndrome Wear braces nightly. If not improved with nightly use, can wear throughout the day.  - ibuprofen (ADVIL,MOTRIN) 600 MG tablet; Take 1 tablet (600 mg total) by mouth every 8 (eight) hours as needed.  Dispense: 30 tablet; Refill: 2  2. Acute sinusitis, recurrence not specified, unspecified location Take mucinex with plenty of water. - azithromycin (ZITHROMAX) 250 MG tablet; Take 2 tabs PO x 1 dose, then 1 tab PO QD x 4 days  Dispense: 6 tablet; Refill: 0   Orlanda Frankum PA-C  [facility] 02/20/2014 11:53 AM

## 2014-02-20 NOTE — Patient Instructions (Signed)
Carpal Tunnel Syndrome The carpal tunnel is a narrow area located on the palm side of your wrist. The tunnel is formed by the wrist bones and ligaments. Nerves, blood vessels, and tendons pass through the carpal tunnel. Repeated wrist motion or certain diseases may cause swelling within the tunnel. This swelling pinches the main nerve in the wrist (median nerve) and causes the painful hand and arm condition called carpal tunnel syndrome. CAUSES   Repeated wrist motions.  Wrist injuries.  Certain diseases like arthritis, diabetes, alcoholism, hyperthyroidism, and kidney failure.  Obesity.  Pregnancy. SYMPTOMS   A "pins and needles" feeling in your fingers or hand, especially in your thumb, index and middle fingers.  Tingling or numbness in your fingers or hand.  An aching feeling in your entire arm, especially when your wrist and elbow are bent for long periods of time.  Wrist pain that goes up your arm to your shoulder.  Pain that goes down into your palm or fingers.  A weak feeling in your hands. DIAGNOSIS  Your health care provider will take your history and perform a physical exam. An electromyography test may be needed. This test measures electrical signals sent out by your nerves into the muscles. The electrical signals are usually slowed by carpal tunnel syndrome. You may also need X-rays. TREATMENT  Carpal tunnel syndrome may clear up by itself. Your health care provider may recommend a wrist splint or medicine such as a nonsteroidal anti-inflammatory medicine. Cortisone injections may help. Sometimes, surgery may be needed to free the pinched nerve.  HOME CARE INSTRUCTIONS   Take all medicine as directed by your health care provider. Only take over-the-counter or prescription medicines for pain, discomfort, or fever as directed by your health care provider.  If you were given a splint to keep your wrist from bending, wear it as directed. It is important to wear the splint at  night. Wear the splint for as long as you have pain or numbness in your hand, arm, or wrist. This may take 1 to 2 months.  Rest your wrist from any activity that may be causing your pain. If your symptoms are work-related, you may need to talk to your employer about changing to a job that does not require using your wrist.  Put ice on your wrist after long periods of wrist activity.  Put ice in a plastic bag.  Place a towel between your skin and the bag.  Leave the ice on for 15-20 minutes, 03-04 times a day.  Keep all follow-up visits as directed by your health care provider. This includes any orthopedic referrals, physical therapy, and rehabilitation. Any delay in getting necessary care could result in a delay or failure of your condition to heal. SEEK IMMEDIATE MEDICAL CARE IF:   You have new, unexplained symptoms.  Your symptoms get worse and are not helped or controlled with medicines. MAKE SURE YOU:   Understand these instructions.  Will watch your condition.  Will get help right away if you are not doing well or get worse. Document Released: 12/23/1999 Document Revised: 05/11/2013 Document Reviewed: 11/10/2010 Exit[employer] Patient Information 2015 [employer]. This information is not intended to replace advice given to you by your health care provider. Make sure you discuss any questions you have with your health care provider.  

## 2014-04-26 ENCOUNTER — Telehealth: Payer: Self-pay | Admitting: Physician Assistant

## 2014-04-26 ENCOUNTER — Ambulatory Visit (INDEPENDENT_AMBULATORY_CARE_PROVIDER_SITE_OTHER): Payer: BC Managed Care – PPO | Admitting: Physician Assistant

## 2014-04-26 ENCOUNTER — Encounter: Payer: Self-pay | Admitting: Physician Assistant

## 2014-04-26 VITALS — BP 138/78 | HR 98 | Temp 98.4°F | Resp 16 | Ht 64.25 in | Wt 186.6 lb

## 2014-04-26 DIAGNOSIS — I10 Essential (primary) hypertension: Secondary | ICD-10-CM | POA: Diagnosis not present

## 2014-04-26 DIAGNOSIS — Z13220 Encounter for screening for lipoid disorders: Secondary | ICD-10-CM

## 2014-04-26 DIAGNOSIS — Z0000 Encounter for general adult medical examination without abnormal findings: Secondary | ICD-10-CM

## 2014-04-26 DIAGNOSIS — R0602 Shortness of breath: Secondary | ICD-10-CM | POA: Diagnosis not present

## 2014-04-26 DIAGNOSIS — R635 Abnormal weight gain: Secondary | ICD-10-CM | POA: Diagnosis not present

## 2014-04-26 DIAGNOSIS — Z130 Encounter for screening for diseases of the blood and blood-forming organs and certain disorders involving the immune mechanism: Secondary | ICD-10-CM

## 2014-04-26 DIAGNOSIS — Z1329 Encounter for screening for other suspected endocrine disorder: Secondary | ICD-10-CM | POA: Diagnosis not present

## 2014-04-26 LAB — COMPREHENSIVE METABOLIC PANEL
ALBUMIN: 3.8 g/dL (ref 3.5–5.2)
ALT: 19 U/L (ref 0–35)
AST: 19 U/L (ref 0–37)
Alkaline Phosphatase: 39 U/L (ref 39–117)
BILIRUBIN TOTAL: 0.5 mg/dL (ref 0.2–1.2)
BUN: 8 mg/dL (ref 6–23)
CO2: 25 mEq/L (ref 19–32)
Calcium: 8.8 mg/dL (ref 8.4–10.5)
Chloride: 103 mEq/L (ref 96–112)
Creat: 0.61 mg/dL (ref 0.50–1.10)
Glucose, Bld: 86 mg/dL (ref 70–99)
Potassium: 4.5 mEq/L (ref 3.5–5.3)
Sodium: 138 mEq/L (ref 135–145)
TOTAL PROTEIN: 6.5 g/dL (ref 6.0–8.3)

## 2014-04-26 LAB — LIPID PANEL
CHOLESTEROL: 141 mg/dL (ref 0–200)
HDL: 47 mg/dL (ref >=46)
LDL Cholesterol: 69 mg/dL (ref 0–99)
TRIGLYCERIDES: 127 mg/dL (ref <150)
Total CHOL/HDL Ratio: 3 Ratio
VLDL: 25 mg/dL (ref 0–40)

## 2014-04-26 LAB — POCT URINALYSIS DIPSTICK
Bilirubin, UA: NEGATIVE
Blood, UA: NEGATIVE
Glucose, UA: NEGATIVE
Ketones, UA: NEGATIVE
LEUKOCYTES UA: NEGATIVE
Nitrite, UA: NEGATIVE
PH UA: 7
PROTEIN UA: NEGATIVE
SPEC GRAV UA: 1.01
UROBILINOGEN UA: 0.2

## 2014-04-26 LAB — POCT CBC
Granulocyte percent: 67.7 %G (ref 37–80)
HEMATOCRIT: 39.7 % (ref 37.7–47.9)
HEMOGLOBIN: 13 g/dL (ref 12.2–16.2)
Lymph, poc: 1.9 (ref 0.6–3.4)
MCH: 26.2 pg — AB (ref 27–31.2)
MCHC: 32.8 g/dL (ref 31.8–35.4)
MCV: 79.7 fL — AB (ref 80–97)
MID (cbc): 0.2 (ref 0–0.9)
MPV: 7.9 fL (ref 0–99.8)
PLATELET COUNT, POC: 299 10*3/uL (ref 142–424)
POC Granulocyte: 4.5 (ref 2–6.9)
POC LYMPH %: 29.1 % (ref 10–50)
POC MID %: 3.2 %M (ref 0–12)
RBC: 4.98 M/uL (ref 4.04–5.48)
RDW, POC: 13.4 %
WBC: 6.6 10*3/uL (ref 4.6–10.2)

## 2014-04-26 LAB — POCT UA - MICROSCOPIC ONLY
Bacteria, U Microscopic: NEGATIVE
CASTS, UR, LPF, POC: NEGATIVE
CRYSTALS, UR, HPF, POC: NEGATIVE
Epithelial cells, urine per micros: NEGATIVE
Mucus, UA: NEGATIVE

## 2014-04-26 LAB — GLUCOSE, POCT (MANUAL RESULT ENTRY): POC GLUCOSE: 72 mg/dL (ref 70–99)

## 2014-04-26 MED ORDER — BISOPROLOL FUMARATE 10 MG PO TABS: 10.0000 mg | ORAL_TABLET | Freq: Every day | ORAL | Status: DC

## 2014-04-26 MED ORDER — HYDROCHLOROTHIAZIDE 25 MG PO TABS: 25.0000 mg | ORAL_TABLET | Freq: Every day | ORAL | Status: DC

## 2014-04-26 NOTE — Telephone Encounter (Signed)
Patient was seen on 04/26/2014. She received a script for Bisoprolol. She states that she and Tishira discussed a 5mg  dosage of this medication however the pharmacy has 10 mg listed. Patient wants to make sure that 10 mg is correct \.   [phone]

## 2014-04-26 NOTE — Progress Notes (Signed)
Subjective:    Patient ID: Kylie Galloway, female    DOB: 1975/09/22, 39 y.o.   MRN: [id number]  HPI Patient presents for complete physical with complaint of DOE and edema.   Has had increased edema of both legs for past 4-6 weeks and is worse at night. DOE present for past 2 weeks and mostly noticed when exercising and walking around. At rest and laying down breathing is normal, however, even with moving around at work in [employer] notices SOB. States she lost about 40 lbs last year and has gained all the weight back in past 6 months, however, has not had any change in her diet and exercises regularly. Diet consist of oatmeal or wheat waffles and [place] bacon for breakfast, salads and some kind of meat for lunch and dinner. Drinks half a gallon to whole gallon of water daily and sprite zero every once in awhile. Exercises 3-5x/week for 30-45 min doing cardio and weight training. Has used phentermine 37.5 with no change in dose for past 2 years. Additionally endorses fatigue, constipation, polyphagia/dipsia, and HA. Denies CP, orthopnea,cough, light-headedness, temperature intolerance, change in hair/skin, change in vision. H/o HTN that has been maintained with Ziac for past 2 years. Had stress testing over 10 years ago to evaluate rapid heart rate which was normal. Denies h/o of type 2 diabetes, but did have gestational diabetes with youngest son. Both maternal grandparents are diabetic. No family h/o CHF or thyroid dz. Denies smoking, illicit drug use, or drinking alcohol.  Health maintenance: LMP 04/15/14 was normal. Last pap 08/2013 was normal and has yearly follow up with [facility]. Biannual eye exams UTD.   Review of Systems  Constitutional: Positive for appetite change, fatigue and unexpected weight change. Negative for fever, diaphoresis and activity change.  HENT: Negative for congestion, ear pain, postnasal drip, rhinorrhea, sinus pressure, tinnitus and trouble swallowing.     Eyes: Negative for photophobia, pain and visual disturbance.  Respiratory: Positive for shortness of breath. Negative for cough, chest tightness, wheezing and stridor.   Cardiovascular: Positive for leg swelling. Negative for chest pain and palpitations.  Gastrointestinal: Positive for constipation. Negative for nausea, vomiting, abdominal pain, diarrhea, blood in stool and abdominal distention.  Endocrine: Positive for polydipsia, polyphagia and polyuria. Negative for cold intolerance and heat intolerance.  Genitourinary: Positive for frequency. Negative for dysuria, urgency, hematuria, flank pain, vaginal bleeding, vaginal discharge, difficulty urinating and menstrual problem.  Neurological: Positive for dizziness (Has vertigo that is not currently affecting her) and headaches. Negative for weakness, light-headedness and numbness.  Hematological: Positive for adenopathy.  Psychiatric/Behavioral: Negative for sleep disturbance and dysphoric mood. The patient is not nervous/anxious.       Objective:   Physical Exam  Constitutional: She is oriented to person, place, and time. She appears well-developed and well-nourished. No distress.  Blood pressure 138/78, pulse 98, temperature 98.4 F (36.9 C), temperature source Oral, resp. rate 16, height 5' 4.25" (1.632 m), weight 186 lb 9.6 oz (84.641 kg), last menstrual period 04/15/2014, SpO2 100 %.  HENT:  Head: Normocephalic and atraumatic.  Right Ear: External ear normal.  Left Ear: External ear normal.  Nose: Nose normal.  Mouth/Throat: Oropharynx is clear and moist. No oropharyngeal exudate.  Eyes: Conjunctivae and EOM are normal. Pupils are equal, round, and reactive to light. Right eye exhibits no discharge. Left eye exhibits no discharge. No scleral icterus.  Neck: Normal range of motion. Neck supple. No JVD present. No thyromegaly present.  Cardiovascular: Normal  rate, regular rhythm, normal heart sounds and intact distal pulses.  Exam  reveals no gallop and no friction rub.   No murmur heard. Pulmonary/Chest: Effort normal and breath sounds normal. No stridor. No respiratory distress. She has no wheezes. She has no rales.  Abdominal: Bowel sounds are normal. She exhibits no distension and no mass. There is no tenderness. There is no rebound and no guarding.  Musculoskeletal: Normal range of motion. She exhibits edema (bilateral 2+ pitted edema). She exhibits no tenderness.  Lymphadenopathy:    She has cervical adenopathy.  Neurological: She is alert and oriented to person, place, and time. She has normal reflexes. No cranial nerve deficit. Coordination normal.  Skin: Skin is warm and dry. No rash noted. She is not diaphoretic. No erythema. No pallor.  Psychiatric: She has a normal mood and affect. Her behavior is normal. Judgment and thought content normal.   Results for orders placed or performed in visit on 04/26/14  POCT glucose (manual entry)  Result Value Ref Range   POC Glucose 72 70 - 99 mg/dl  POCT urinalysis dipstick  Result Value Ref Range   Color, UA Light yellow    Clarity, UA Clear    Glucose, UA Negative    Bilirubin, UA Negative    Ketones, UA Negative    Spec Grav, UA 1.010    Blood, UA Negative    pH, UA 7.0    Protein, UA Negative    Urobilinogen, UA 0.2    Nitrite, UA Negative    Leukocytes, UA Negative   POCT CBC  Result Value Ref Range   WBC 6.6 4.6 - 10.2 K/uL   Lymph, poc 1.9 0.6 - 3.4   POC LYMPH PERCENT 29.1 10 - 50 %L   MID (cbc) 0.2 0 - 0.9   POC MID % 3.2 0 - 12 %M   POC Granulocyte 4.5 2 - 6.9   Granulocyte percent 67.7 37 - 80 %G   RBC 4.98 4.04 - 5.48 M/uL   Hemoglobin 13.0 12.2 - 16.2 g/dL   HCT, POC 16.139.7 09.637.7 - 47.9 %   MCV 79.7 (A) 80 - 97 fL   MCH, POC 26.2 (A) 27 - 31.2 pg   MCHC 32.8 31.8 - 35.4 g/dL   RDW, POC 04.513.4 %   Platelet Count, POC 299.0 142 - 424 K/uL   MPV 7.9 0 - 99.8 fL   Wt Readings from Last 3 Encounters:  04/26/14 186 lb 9.6 oz (84.641 kg)    02/20/14 180 lb 12.8 oz (82.01 kg)  09/24/13 167 lb (75.751 kg)      Assessment & Plan:  1. Annual physical exam Diet and exercise discussed. Pap exam deferred.   2. SOB (shortness of breath) - EKG 12-Lead - Ambulatory referral to Cardiology  3. Screening for thyroid disorder - TSH  4. Screening for cholesterol level - Lipid panel  5. Screening for deficiency anemia - POCT CBC  6. Essential hypertension Should RTC in 4-6 weeks to see how appt with cards went and if edema improved.  - Comprehensive metabolic panel - Ambulatory referral to Cardiology - bisoprolol (ZEBETA) 10 MG tablet; Take 1 tablet (10 mg total) by mouth daily.  Dispense: 30 tablet; Refill: 3 - hydrochlorothiazide (HYDRODIURIL) 25 MG tablet; Take 1 tablet (25 mg total) by mouth daily.  Dispense: 90 tablet; Refill: 3  7. Weight gain - POCT glucose (manual entry) - POCT urinalysis dipstick - POCT UA - Microscopic Only   Elonzo Sopp  PA-C  [facility] 04/26/2014 5:11 PM

## 2014-04-26 NOTE — Progress Notes (Signed)
EKG read with Dr. Nilda SimmerKristi Smith: normal EKG, normal sinus rhythm

## 2014-04-27 ENCOUNTER — Telehealth: Payer: Self-pay | Admitting: Physician Assistant

## 2014-04-27 DIAGNOSIS — I10 Essential (primary) hypertension: Secondary | ICD-10-CM

## 2014-04-27 LAB — TSH: TSH: 1.933 u[IU]/mL (ref 0.350–4.500)

## 2014-04-27 MED ORDER — BISOPROLOL FUMARATE 5 MG PO TABS: 5.0000 mg | ORAL_TABLET | Freq: Every day | ORAL | Status: DC

## 2014-04-27 NOTE — Telephone Encounter (Signed)
Spoke with patient and apparently she was taking Ziac 5/6.25 not 10/6.25. Wanted to know since she already picked up bisoprolol 10 mg if she can cut pill in half as pharmacy will not take back. Confirmed ok to cut pill in half.

## 2014-04-27 NOTE — Telephone Encounter (Signed)
tishira-- Do you want pt on 5 or 10mg ? Please advise. Thanks!

## 2014-04-28 ENCOUNTER — Encounter: Payer: Self-pay | Admitting: Physician Assistant

## 2014-05-03 ENCOUNTER — Telehealth: Payer: Self-pay

## 2014-05-03 ENCOUNTER — Telehealth: Payer: Self-pay | Admitting: Physician Assistant

## 2014-05-03 NOTE — Telephone Encounter (Signed)
Pt calling about lab results. They haven't been reviewed yet. 

## 2014-05-03 NOTE — Telephone Encounter (Signed)
Relayed normal labs to patient. DOE and edema unchanged. No CP accompanying. Has cardiology appt in 3 weeks.

## 2014-05-03 NOTE — Telephone Encounter (Signed)
Pt was seen here on 4/18 and had lab work done. She would like a call back at [phone]

## 2014-05-24 ENCOUNTER — Ambulatory Visit (INDEPENDENT_AMBULATORY_CARE_PROVIDER_SITE_OTHER): Payer: BC Managed Care – PPO | Admitting: Physician Assistant

## 2014-05-24 VITALS — BP 126/88 | HR 103 | Temp 98.1°F | Resp 16 | Ht 64.25 in | Wt 186.8 lb

## 2014-05-24 DIAGNOSIS — R635 Abnormal weight gain: Secondary | ICD-10-CM

## 2014-05-24 DIAGNOSIS — R0609 Other forms of dyspnea: Secondary | ICD-10-CM

## 2014-05-24 DIAGNOSIS — I10 Essential (primary) hypertension: Secondary | ICD-10-CM

## 2014-05-25 ENCOUNTER — Encounter: Payer: Self-pay | Admitting: Physician Assistant

## 2014-05-25 NOTE — Progress Notes (Signed)
   Subjective:    Patient ID: Kylie Galloway, female    DOB: 01/04/1976, 39 y.o.   MRN: [id number]  HPI Patient with PMH of HTN presents for follow up for DOE and edema. States that both are roughly the same since last visit. Has discontinued exercise since last visit, but has continued current diet as she was already eating healthy. Denies new sx at this time and has appt with cardiology 05/26/14. Sx of fatigue still present. Denies CP, orthopnea, fever, cough, light-headedness, HA/dizziness, temperature intolerance, change in hair/skin, or change in vision.    Review of Systems As noted above.    Objective:   Physical Exam  Constitutional: She is oriented to person, place, and time. She appears well-developed and well-nourished. No distress.  Blood pressure 126/88, pulse 103, temperature 98.1 F (36.7 C), temperature source Oral, resp. rate 16, height 5' 4.25" (1.632 m), weight 186 lb 12.8 oz (84.732 kg), last menstrual period 05/13/2014, SpO2 100 %.   HENT:  Head: Normocephalic and atraumatic.  Right Ear: External ear normal.  Left Ear: External ear normal.  Eyes: Conjunctivae are normal. Right eye exhibits no discharge. Left eye exhibits no discharge. No scleral icterus.  Neck: Neck supple. No JVD present. No thyromegaly present.  Cardiovascular: Normal rate, regular rhythm, normal heart sounds and intact distal pulses.  Exam reveals no gallop and no friction rub.   No murmur heard. Pulmonary/Chest: Effort normal and breath sounds normal. No respiratory distress. She has no wheezes. She has no rales.  Musculoskeletal: She exhibits edema (1+).  Lymphadenopathy:    She has no cervical adenopathy.  Neurological: She is alert and oriented to person, place, and time.  Skin: She is not diaphoretic.   Wt Readings from Last 3 Encounters:  05/24/14 186 lb 12.8 oz (84.732 kg)  04/26/14 186 lb 9.6 oz (84.641 kg)  02/20/14 180 lb 12.8 oz (82.01 kg)      Assessment & Plan:  1. Essential  hypertension 2. Weight gain 3. DOE (dyspnea on exertion) Will not make any changes until patient has appt with cardiology and will defer to their recommendations. Should wear compression hose daily.   Janan Ridgeishira Aydien Majette PA-C  [facility] 05/25/2014 5:49 PM

## 2014-05-26 ENCOUNTER — Encounter: Payer: Self-pay | Admitting: Cardiology

## 2014-05-26 ENCOUNTER — Ambulatory Visit (INDEPENDENT_AMBULATORY_CARE_PROVIDER_SITE_OTHER): Payer: BC Managed Care – PPO | Admitting: Cardiology

## 2014-05-26 VITALS — BP 130/82 | HR 82 | Ht 65.0 in | Wt 187.0 lb

## 2014-05-26 DIAGNOSIS — R609 Edema, unspecified: Secondary | ICD-10-CM

## 2014-05-26 NOTE — Progress Notes (Signed)
Cardiology Office Note   Date:  05/26/2014   ID:  Kylie PackerMindy O Galloway, DOB 08/08/1975, MRN [id number]  PCP:  Lucilla EdinAUB, STEVE A, MD  Cardiologist:   Rollene RotundaJames Chanell Nadeau, MD   No chief complaint on file.     History of Present Illness: Kylie Galloway is a 39 y.o. female who presents for presents for evaluation of lower extremity swelling. The patient has no past cardiac history other than a treadmill test about 10 years ago where she was apparently told she had a rapid heart rate with exercise. She's had a significant weight gain of about 30 pounds in the last year without a change in her diet or activity level. She's had some lower extremity swelling. This is present when she wakes up in the morning but worse in the evening. She does not abuse salt. She does drink a lot of water. She is on her feet quite a bit. She's not been having any other cardiovascular symptoms and she denies any chest pressure, neck or arm discomfort. She's not had any palpitations, presyncope or syncope. She denies any PND or orthopnea. She is active at work. She takes care of her family. She exercises walking on a treadmill.   Past Medical History  Diagnosis Date  . Hypertension   . Allergy   . H/O Bell's palsy     rt sided  . Anxiety     Past Surgical History  Procedure Laterality Date  . Cesarean section       Current Outpatient Prescriptions  Medication Sig Dispense Refill  . bisoprolol (ZEBETA) 5 MG tablet Take 1 tablet (5 mg total) by mouth daily. 30 tablet 3  . desogestrel-ethinyl estradiol (APRI,EMOQUETTE,SOLIA) 0.15-30 MG-MCG tablet Take 1 tablet by mouth daily.    . hydrochlorothiazide (HYDRODIURIL) 25 MG tablet Take 1 tablet (25 mg total) by mouth daily. 90 tablet 3  . ibuprofen (ADVIL,MOTRIN) 600 MG tablet Take 1 tablet (600 mg total) by mouth every 8 (eight) hours as needed. 30 tablet 2  . phentermine 37.5 MG capsule Take 37.5 mg by mouth every morning.     No current facility-administered medications  for this visit.    Allergies:   Review of patient's allergies indicates no known allergies.    Social History:  The patient  reports that she has never smoked. She has never used smokeless tobacco. She reports that she does not drink alcohol or use illicit drugs.   Family History:  The patient's Her parents died when she was [age] in an MVA.  family history includes CAD (age of onset: 4670) in her paternal grandfather and paternal grandmother.    ROS:  Please see the history of present illness.   Otherwise, review of systems are positive for none.   All other systems are reviewed and negative.    PHYSICAL EXAM: VS:  BP 130/82 mmHg  Pulse 82  Ht 5\' 5"  (1.651 m)  Wt 187 lb (84.823 kg)  BMI 31.12 kg/m2  LMP 05/13/2014 , BMI Body mass index is 31.12 kg/(m^2). GENERAL:  Well appearing HEENT:  Pupils equal round and reactive, fundi not visualized, oral mucosa unremarkable NECK:  No jugular venous distention, waveform within normal limits, carotid upstroke brisk and symmetric, no bruits, no thyromegaly LYMPHATICS:  No cervical, inguinal adenopathy LUNGS:  Clear to auscultation bilaterally BACK:  No CVA tenderness CHEST:  Unremarkable HEART:  PMI not displaced or sustained,S1 and S2 within normal limits, no S3, no S4, no clicks, no rubs, no  murmurs ABD:  Flat, positive bowel sounds normal in frequency in pitch, no bruits, no rebound, no guarding, no midline pulsatile mass, no hepatomegaly, no splenomegaly EXT:  2 plus pulses throughout, no edema, no cyanosis no clubbing SKIN:  No rashes no nodules NEURO:  Cranial nerves II through XII grossly intact, motor grossly intact throughout PSYCH:  Cognitively intact, oriented to person place and time    EKG:  EKG is not ordered today. The ekg ordered 4/18 demonstrates sinus rhythm, rate 89, axis within normal limits, intervals within normal limits, no acute ST-T wave changes.   Recent Labs: 04/26/2014: ALT 19; BUN 8; Creatinine 0.61; Hemoglobin  13.0; Potassium 4.5; Sodium 138; TSH 1.933    Lipid Panel    Component Value Date/Time   CHOL 141 04/26/2014 1414   TRIG 127 04/26/2014 1414   HDL 47 04/26/2014 1414   CHOLHDL 3.0 04/26/2014 1414   VLDL 25 04/26/2014 1414   LDLCALC 69 04/26/2014 1414      Wt Readings from Last 3 Encounters:  05/26/14 187 lb (84.823 kg)  05/24/14 186 lb 12.8 oz (84.732 kg)  04/26/14 186 lb 9.6 oz (84.641 kg)      Other studies Reviewed: Additional studies/ records that were reviewed today include: Previous labs and office record. Review of the above records demonstrates:  Please see elsewhere in the note.     ASSESSMENT AND PLAN:  EDEMA:  I have a low suspicion that this is heart failure she has no significant symptoms and her exam is essentially unremarkable. I think she has a normal BNP level and negative predictive value of this will be excellent to exclude structural heart disease. I did review labs and I don't see any evidence of thyroid abnormalities. She will go to see her GYN see whether this could potentially be something related to this.  HTN:  Her blood pressure is well controlled. No change in therapy is indicated.  She had a normal chemistry and urinalysis. I would think further workup for secondary causes would be low yield.   Current medicines are reviewed at length with the patient today.  The patient does not have concerns regarding medicines.  The following changes have been made:  no change  Labs/ tests ordered today include:   Orders Placed This Encounter  Procedures  . Brain natriuretic peptide    Disposition:   FU with me as needed.      Signed, Rollene RotundaJames Caryle Helgeson, MD  05/26/2014 1:23 PM    [facility]

## 2014-05-26 NOTE — Patient Instructions (Signed)
Your physician recommends that you return for lab work TODAY.  Dr Antoine PocheHochrein recommends that you follow-up with him on an as needed basis.

## 2014-05-27 LAB — BRAIN NATRIURETIC PEPTIDE: BRAIN NATRIURETIC PEPTIDE: 12.9 pg/mL (ref 0.0–100.0)

## 2014-05-28 ENCOUNTER — Emergency Department (HOSPITAL_COMMUNITY)
Admission: EM | Admit: 2014-05-28 | Discharge: 2014-05-28 | Disposition: A | Discharge status: Home / Self Care | Payer: BC Managed Care – PPO | Attending: Emergency Medicine | Admitting: Emergency Medicine

## 2014-05-28 ENCOUNTER — Encounter (HOSPITAL_COMMUNITY): Payer: Self-pay | Admitting: *Deleted

## 2014-05-28 DIAGNOSIS — I10 Essential (primary) hypertension: Secondary | ICD-10-CM | POA: Diagnosis not present

## 2014-05-28 DIAGNOSIS — Z79899 Other long term (current) drug therapy: Secondary | ICD-10-CM | POA: Insufficient documentation

## 2014-05-28 DIAGNOSIS — R42 Dizziness and giddiness: Secondary | ICD-10-CM | POA: Diagnosis present

## 2014-05-28 DIAGNOSIS — G43809 Other migraine, not intractable, without status migrainosus: Secondary | ICD-10-CM

## 2014-05-28 DIAGNOSIS — Z8659 Personal history of other mental and behavioral disorders: Secondary | ICD-10-CM | POA: Diagnosis not present

## 2014-05-28 MED ORDER — MECLIZINE HCL 50 MG PO TABS: 25.0000 mg | ORAL_TABLET | Freq: Three times a day (TID) | ORAL | Status: DC | PRN

## 2014-05-28 MED ORDER — METOCLOPRAMIDE HCL 5 MG/ML IJ SOLN
10.0000 mg | Freq: Once | INTRAMUSCULAR | Status: AC
  Administered 2014-05-28: 10 mg via INTRAVENOUS
  Filled 2014-05-28: qty 2

## 2014-05-28 MED ORDER — MECLIZINE HCL 25 MG PO TABS
50.0000 mg | ORAL_TABLET | Freq: Once | ORAL | Status: AC
  Administered 2014-05-28: 50 mg via ORAL
  Filled 2014-05-28: qty 2

## 2014-05-28 MED ORDER — KETOROLAC TROMETHAMINE 30 MG/ML IJ SOLN
30.0000 mg | Freq: Once | INTRAMUSCULAR | Status: AC
  Administered 2014-05-28: 30 mg via INTRAVENOUS
  Filled 2014-05-28: qty 1

## 2014-05-28 MED ORDER — SODIUM CHLORIDE 0.9 % IV BOLUS (SEPSIS)
1000.0000 mL | Freq: Once | INTRAVENOUS | Status: AC
  Administered 2014-05-28: 1000 mL via INTRAVENOUS

## 2014-05-28 NOTE — ED Provider Notes (Signed)
CSN: [id number]     Arrival date & time 05/28/14  0645 History   First MD Initiated Contact with Patient 05/28/14 [phone]     Chief Complaint  Patient presents with  . Dizziness  . Migraine     (Consider location/radiation/quality/duration/timing/severity/associated sxs/prior Treatment) Patient is a 39 y.o. female presenting with dizziness and migraines. The history is provided by the patient. No language interpreter was used.  Dizziness Quality:  Vertigo Severity:  Moderate Onset quality:  Sudden Duration:  2 hours Timing:  Constant Progression:  Unchanged Chronicity:  New Context comment:  Making son's lunch Relieved by:  Being still and closing eyes Worsened by:  Movement (opneing eyes) Ineffective treatments:  None tried Associated symptoms: headaches and nausea   Associated symptoms: no chest pain, no diarrhea, no hearing loss, no palpitations, no shortness of breath, no tinnitus, no vision changes, no vomiting and no weakness   Headaches:    Severity:  Moderate   Onset quality:  Gradual   Duration:  2 hours   Timing:  Intermittent   Progression:  Waxing and waning   Chronicity:  Recurrent Risk factors: hx of vertigo   Risk factors: no anemia, no heart disease, no hx of stroke, no Meniere's disease, no multiple medications and no new medications   Migraine Associated symptoms include headaches. Pertinent negatives include no chest pain, no abdominal pain and no shortness of breath.    Past Medical History  Diagnosis Date  . Hypertension   . Allergy   . H/O Bell's palsy     rt sided  . Anxiety    Past Surgical History  Procedure Laterality Date  . Cesarean section     Family History  Problem Relation Age of Onset  . CAD Paternal Grandfather 5570  . CAD Paternal Grandmother 3270   History  Substance Use Topics  . Smoking status: Never Smoker   . Smokeless tobacco: Never Used  . Alcohol Use: No   OB History    No data available     Review of Systems   Constitutional: Negative for fever, chills, diaphoresis, activity change, appetite change and fatigue.  HENT: Negative for congestion, facial swelling, hearing loss, rhinorrhea, sore throat and tinnitus.   Eyes: Negative for photophobia and discharge.  Respiratory: Negative for cough, chest tightness and shortness of breath.   Cardiovascular: Negative for chest pain, palpitations and leg swelling.  Gastrointestinal: Positive for nausea. Negative for vomiting, abdominal pain and diarrhea.  Endocrine: Negative for polydipsia and polyuria.  Genitourinary: Negative for dysuria, frequency, difficulty urinating and pelvic pain.  Musculoskeletal: Negative for back pain, arthralgias, neck pain and neck stiffness.  Skin: Negative for color change and wound.  Allergic/Immunologic: Negative for immunocompromised state.  Neurological: Positive for dizziness and headaches. Negative for facial asymmetry, weakness and numbness.  Hematological: Does not bruise/bleed easily.  Psychiatric/Behavioral: Negative for confusion and agitation.      Allergies  Review of patient's allergies indicates no known allergies.  Home Medications   Prior to Admission medications   Medication Sig Start Date End Date Taking? Authorizing Provider  bisoprolol (ZEBETA) 5 MG tablet Take 1 tablet (5 mg total) by mouth daily. 04/27/14  Yes Tishira R Brewington, PA-C  desogestrel-ethinyl estradiol (APRI,EMOQUETTE,SOLIA) 0.15-30 MG-MCG tablet Take 1 tablet by mouth daily.   Yes Historical Provider, MD  hydrochlorothiazide (HYDRODIURIL) 25 MG tablet Take 1 tablet (25 mg total) by mouth daily. 04/26/14  Yes Tishira R Brewington, PA-C  phentermine (ADIPEX-P) 37.5 MG tablet Take 37.5 mg  by mouth daily before breakfast.   Yes Historical Provider, MD  ibuprofen (ADVIL,MOTRIN) 600 MG tablet Take 1 tablet (600 mg total) by mouth every 8 (eight) hours as needed. Patient not taking: Reported on 05/28/2014 02/20/14   Tishira R Brewington, PA-C   meclizine (ANTIVERT) 50 MG tablet Take 0.5 tablets (25 mg total) by mouth 3 (three) times daily as needed. 05/28/14   Toy CookeyMegan Docherty, MD   BP 118/62 mmHg  Pulse 78  Temp(Src) 98.2 F (36.8 C) (Oral)  Resp 16  SpO2 100%  LMP 05/13/2014 Physical Exam  Constitutional: She is oriented to person, place, and time. She appears well-developed and well-nourished. No distress.  HENT:  Head: Normocephalic and atraumatic.  Mouth/Throat: No oropharyngeal exudate.  Eyes: Pupils are equal, round, and reactive to light.  Neck: Normal range of motion. Neck supple.  Cardiovascular: Normal rate, regular rhythm and normal heart sounds.  Exam reveals no gallop and no friction rub.   No murmur heard. Pulmonary/Chest: Effort normal and breath sounds normal. No respiratory distress. She has no wheezes. She has no rales.  Abdominal: Soft. Bowel sounds are normal. She exhibits no distension and no mass. There is no tenderness. There is no rebound and no guarding.  Musculoskeletal: Normal range of motion. She exhibits no edema or tenderness.  Neurological: She is alert and oriented to person, place, and time. She has normal strength. She displays no atrophy and no tremor. No cranial nerve deficit or sensory deficit. She exhibits normal muscle tone. She displays no seizure activity. Gait (mild ataxia) abnormal. Coordination normal. GCS eye subscore is 4. GCS verbal subscore is 5. GCS motor subscore is 6.  Skin: Skin is warm and dry.  Psychiatric: She has a normal mood and affect.    ED Course  Procedures (including critical care time) Labs Review Labs Reviewed - No data to display  Imaging Review No results found.   EKG Interpretation None      MDM   Final diagnoses:  Other migraine without status migrainosus, not intractable  Vertigo    Pt is a 39 y.o. female with Pmhx as above who presents with about 1 hr of frontal h/a, and vertigo as well as R anterior thigh pain. No numbness, weakness, recent  injury, confusion. Pt reports hx of 5-6 similar symptoms in the past, though reports h/a today somewhat worse. No thunderclap, sudden severe onset. Neuro exam unremarkable except for slight L facial droop from chronic Bell's palsy. +inc in symptoms w/ dix halpike R>L, though no nystagmus seen. Will treat with migraine cocktail + meclizine and reexamine.   Patient feeling much improved, still has a mild headache.  The vertigo has resolved.  She would like to go home.  She's been able to ambulate in the room without difficulty.  She agrees to return should symptoms worsen.  Prescription for meclizine will be given.  I suspect migraine complaint onset of vertigo.  Doubt CVA, TIA or subarachnoid hemorrhage.  The infectious signs or symptoms to suggest meningitis.    Ashby O Hoelzel evaluation in the Emergency Department is complete. It has been determined that no acute conditions requiring further emergency intervention are present at this time. The patient/guardian have been advised of the diagnosis and plan. We have discussed signs and symptoms that warrant return to the ED, such as changes or worsening in symptoms, worsening headache, numbness, weakness, confusion, fever.       Toy CookeyMegan Docherty, MD 05/28/14 [phone]

## 2014-05-28 NOTE — ED Notes (Signed)
Bed: [id number] Expected date:  Expected time:  Means of arrival:  Comments: EMS vertigo / HA / leg cramps

## 2014-05-28 NOTE — ED Notes (Signed)
Per EMS pt from home, complaining of migraine, vertigo and R leg pain started about hour ago.

## 2014-05-28 NOTE — Discharge Instructions (Signed)

## 2014-05-31 ENCOUNTER — Ambulatory Visit (INDEPENDENT_AMBULATORY_CARE_PROVIDER_SITE_OTHER): Payer: BC Managed Care – PPO | Admitting: Internal Medicine

## 2014-05-31 VITALS — BP 128/86 | HR 99 | Temp 97.9°F | Resp 18 | Ht 64.0 in | Wt 189.0 lb

## 2014-05-31 DIAGNOSIS — G43101 Migraine with aura, not intractable, with status migrainosus: Secondary | ICD-10-CM | POA: Diagnosis not present

## 2014-05-31 MED ORDER — NAPROXEN 500 MG PO TABS: 500.0000 mg | ORAL_TABLET | Freq: Two times a day (BID) | ORAL | Status: DC

## 2014-05-31 MED ORDER — ONDANSETRON HCL 8 MG PO TABS: 8.0000 mg | ORAL_TABLET | Freq: Three times a day (TID) | ORAL | Status: DC | PRN

## 2014-05-31 MED ORDER — HYDROCODONE-ACETAMINOPHEN 5-325 MG PO TABS: 1.0000 | ORAL_TABLET | Freq: Four times a day (QID) | ORAL | Status: DC | PRN

## 2014-05-31 MED ORDER — PROMETHAZINE HCL 25 MG/ML IJ SOLN: 25.0000 mg | Freq: Four times a day (QID) | INTRAMUSCULAR | Status: DC | PRN

## 2014-05-31 MED ORDER — PROMETHAZINE HCL 25 MG/ML IJ SOLN
25.0000 mg | Freq: Once | INTRAMUSCULAR | Status: AC
  Administered 2014-05-31: 25 mg via INTRAMUSCULAR

## 2014-05-31 MED ORDER — KETOROLAC TROMETHAMINE 60 MG/2ML IM SOLN
60.0000 mg | Freq: Once | INTRAMUSCULAR | Status: AC
  Administered 2014-05-31: 60 mg via INTRAMUSCULAR

## 2014-05-31 NOTE — Progress Notes (Signed)
   Subjective:    Patient ID: Kylie Galloway, female    DOB: 11/05/1975, 39 y.o.   MRN: [id number]  HPI Recurrent migraine with aura and vertigo, never fully worked up, has had multiple CT head since 2007. Also has 37 yo bells palsy. Needs neurology consult soon Presently has a HA ongoing. Shot worked in [employer]   Review of Systems     Objective:   Physical Exam  Constitutional: She is oriented to person, place, and time. She appears well-developed and well-nourished. No distress.  HENT:  Head: Normocephalic.  Right Ear: External ear normal.  Nose: Nose normal.  Mouth/Throat: Oropharynx is clear and moist.  Eyes: Conjunctivae are normal. Pupils are equal, round, and reactive to light.  Neck: Normal range of motion. Neck supple.  Cardiovascular: Normal rate, regular rhythm and normal heart sounds.   Pulmonary/Chest: Effort normal and breath sounds normal.  Musculoskeletal: Normal range of motion.  Lymphadenopathy:    She has no cervical adenopathy.  Neurological: She is alert and oriented to person, place, and time. She has normal strength and normal reflexes. No cranial nerve deficit. She displays a negative Romberg sign. Coordination and gait normal.  No drift Balance is excellent each foot.  Psychiatric: She has a normal mood and affect. Her behavior is normal. Thought content normal.  Vitals reviewed.         Assessment & Plan:

## 2014-05-31 NOTE — Patient Instructions (Signed)
Bickerstaff's Syndrome (Basilar Migraine) CAUSES  When migraine affects the circulation in back of the brain or neck, it can cause basilar migraine or Bickerstaff's syndrome.  SYMPTOMS  It occurs most frequently in [place] women. Symptoms include:  Dizziness.  Double vision.  Loss of balance.  Confusion.  Slurred speech.  Fainting.  Disorientation.  During the severe (acute) headache, some people lose consciousness. Often these patients are mistakenly thought to be intoxicated, under the influence of drugs, or suffering from other conditions. A previous history of migraine is helpful in making the diagnosis.  TREATMENT  Basilar migraines are treated medically the same as all other migraines. HOME CARE INSTRUCTIONS   If this is your first diagnosed migraine headache, you may simply choose to wait and watch. You can wait to see if you have another headache before deciding on a further treatment.  You may consult your caregiver or do as suggested by the current treating caregiver.  Numerous medications can prevent these headaches if they are recurrent or should they become recurrent. Your caregiver can help you with a medication or treatment program that will be helpful to you.  If this has been a chronic (long-standing) condition, using continuous narcotics is not recommended. Using long-term narcotics can cause recurrent migraines. Narcotics are a temporary measure only. They are used for the infrequent migraine that fails to respond to all other measures. SEEK IMMEDIATE MEDICAL CARE IF:   You do not get relief from the medications given to you or you have a recurrence of pain.  You have an unexplained oral temperature above 102 F (38.9 C), or as your caregiver suggests.  You have a stiff neck.  You have loss of vision.  You have muscular weakness.  You have loss of muscular control.  You develop severe symptoms different from your first symptoms.  You start losing  your balance or have trouble walking.  You feel faint or pass out. MAKE SURE YOU:   Understand these instructions.  Will watch your condition.  Will get help right away if you are not doing well or get worse. Document Released: 12/25/2004 Document Revised: 03/19/2011 Document Reviewed: 08/13/2007 [facility] Patient Information 2015 [place], [place]. This information is not intended to replace advice given to you by your health care provider. Make sure you discuss any questions you have with your health care provider. Migraine Headache A migraine headache is an intense, throbbing pain on one or both sides of your head. A migraine can last for 30 minutes to several hours. CAUSES  The exact cause of a migraine headache is not always known. However, a migraine may be caused when nerves in the brain become irritated and release chemicals that cause inflammation. This causes pain. Certain things may also trigger migraines, such as:  Alcohol.  Smoking.  Stress.  Menstruation.  Aged cheeses.  Foods or drinks that contain nitrates, glutamate, aspartame, or tyramine.  Lack of sleep.  Chocolate.  Caffeine.  Hunger.  Physical exertion.  Fatigue.  Medicines used to treat chest pain (nitroglycerine), birth control pills, estrogen, and some blood pressure medicines. SIGNS AND SYMPTOMS  Pain on one or both sides of your head.  Pulsating or throbbing pain.  Severe pain that prevents daily activities.  Pain that is aggravated by any physical activity.  Nausea, vomiting, or both.  Dizziness.  Pain with exposure to bright lights, loud noises, or activity.  General sensitivity to bright lights, loud noises, or smells. Before you get a migraine, you may get warning  signs that a migraine is coming (aura). An aura may include:  Seeing flashing lights.  Seeing bright spots, halos, or zigzag lines.  Having tunnel vision or blurred vision.  Having feelings of numbness or  tingling.  Having trouble talking.  Having muscle weakness. DIAGNOSIS  A migraine headache is often diagnosed based on:  Symptoms.  Physical exam.  A CT scan or MRI of your head. These imaging tests cannot diagnose migraines, but they can help rule out other causes of headaches. TREATMENT Medicines may be given for pain and nausea. Medicines can also be given to help prevent recurrent migraines.  HOME CARE INSTRUCTIONS  Only take over-the-counter or prescription medicines for pain or discomfort as directed by your health care provider. The use of long-term narcotics is not recommended.  Lie down in a dark, quiet room when you have a migraine.  Keep a journal to find out what may trigger your migraine headaches. For example, write down:  What you eat and drink.  How much sleep you get.  Any change to your diet or medicines.  Limit alcohol consumption.  Quit smoking if you smoke.  Get 7-9 hours of sleep, or as recommended by your health care provider.  Limit stress.  Keep lights dim if bright lights bother you and make your migraines worse. SEEK IMMEDIATE MEDICAL CARE IF:   Your migraine becomes severe.  You have a fever.  You have a stiff neck.  You have vision loss.  You have muscular weakness or loss of muscle control.  You start losing your balance or have trouble walking.  You feel faint or pass out.  You have severe symptoms that are different from your first symptoms. MAKE SURE YOU:   Understand these instructions.  Will watch your condition.  Will get help right away if you are not doing well or get worse. Document Released: 12/25/2004 Document Revised: 05/11/2013 Document Reviewed: 09/01/2012 [facility] Patient Information 2015 [place], [place]. This information is not intended to replace advice given to you by your health care provider. Make sure you discuss any questions you have with your health care provider.

## 2014-06-08 ENCOUNTER — Ambulatory Visit: Payer: BC Managed Care – PPO | Admitting: Cardiology

## 2014-06-25 ENCOUNTER — Ambulatory Visit (INDEPENDENT_AMBULATORY_CARE_PROVIDER_SITE_OTHER): Payer: BC Managed Care – PPO | Admitting: Diagnostic Neuroimaging

## 2014-06-25 ENCOUNTER — Encounter: Payer: Self-pay | Admitting: Diagnostic Neuroimaging

## 2014-06-25 VITALS — BP 129/87 | HR 87 | Ht 65.0 in | Wt 191.2 lb

## 2014-06-25 DIAGNOSIS — G43109 Migraine with aura, not intractable, without status migrainosus: Secondary | ICD-10-CM

## 2014-06-25 DIAGNOSIS — R42 Dizziness and giddiness: Secondary | ICD-10-CM

## 2014-06-25 MED ORDER — TOPIRAMATE 50 MG PO TABS: 50.0000 mg | ORAL_TABLET | Freq: Two times a day (BID) | ORAL | Status: DC

## 2014-06-25 MED ORDER — RIZATRIPTAN BENZOATE 10 MG PO TBDP: 10.0000 mg | ORAL_TABLET | ORAL | Status: DC | PRN

## 2014-06-25 NOTE — Progress Notes (Signed)
[facility]  PATIENT: Kylie Galloway DOB: [birth year]  REFERRING CLINICIAN: Guest HISTORY FROM: patient  REASON FOR VISIT: new consult    HISTORICAL  CHIEF COMPLAINT:  Chief Complaint  Patient presents with  . Migraine    with aura, rm 7, "vertigo, dizziness"    HISTORY OF PRESENT ILLNESS:   39 year old right-handed female here for evaluation of headaches, dizziness, visual changes. Since 2010 patient has had intermittent episodes of headache, frontal, occipital, sometimes unilateral pressure and throbbing sensation with nausea, photophobia, proceeded by seeing spots and vision changes. She was having 1 or less episodes per month. Gradually these increased uptake 3-4 per month. In 2016 she has had 5 episodes. Recent episodes have changed to include severe dizzy, spinning sensation, lasting much longer than previously. Prior episodes used the last 1-2 hours at a time. Now episodes can last 5 hours up to 2-3 days at a time. No prior diagnosis of migraine. Patient's daughter has some headaches but not officially diagnosed.  Patient denies any specific triggering or aggravating factors. No food, caffeine, hormonal, sleep or stress triggers.  Patient has tried Motrin, BC powder, Tylenol, Aleve, Excedrin migraine in the past for headache treatment, with mild relief.  Patient has remote history of Bell's palsy on the right side 2003 with incomplete recovery.  Patient has had intermittent bilateral hand numbness in the past.   REVIEW OF SYSTEMS: Full 14 system review of systems performed and notable only for weight gain fatigue swelling in legs being sensation constipation blurred vision feeling hot increased thirst flushing dizziness headache numbness restless legs.   ALLERGIES: No Known Allergies  HOME MEDICATIONS: Outpatient Prescriptions Prior to Visit  Medication Sig Dispense Refill  . bisoprolol (ZEBETA) 5 MG tablet Take 1 tablet (5 mg total) by mouth daily.  30 tablet 3  . desogestrel-ethinyl estradiol (APRI,EMOQUETTE,SOLIA) 0.15-30 MG-MCG tablet Take 1 tablet by mouth daily.    . hydrochlorothiazide (HYDRODIURIL) 25 MG tablet Take 1 tablet (25 mg total) by mouth daily. 90 tablet 3  . naproxen (NAPROSYN) 500 MG tablet Take 1 tablet (500 mg total) by mouth 2 (two) times daily with a meal. 30 tablet 1  . phentermine (ADIPEX-P) 37.5 MG tablet Take 37.5 mg by mouth daily before breakfast.    . HYDROcodone-acetaminophen (NORCO/VICODIN) 5-325 MG per tablet Take 1 tablet by mouth every 6 (six) hours as needed. (Patient not taking: Reported on 06/25/2014) 30 tablet 0  . ibuprofen (ADVIL,MOTRIN) 600 MG tablet Take 1 tablet (600 mg total) by mouth every 8 (eight) hours as needed. (Patient not taking: Reported on 06/25/2014) 30 tablet 2  . meclizine (ANTIVERT) 50 MG tablet Take 0.5 tablets (25 mg total) by mouth 3 (three) times daily as needed. (Patient not taking: Reported on 06/25/2014) 30 tablet 0  . ondansetron (ZOFRAN) 8 MG tablet Take 1 tablet (8 mg total) by mouth every 8 (eight) hours as needed for nausea or vomiting. (Patient not taking: Reported on 06/25/2014) 20 tablet 0   No facility-administered medications prior to visit.    PAST MEDICAL HISTORY: Past Medical History  Diagnosis Date  . Hypertension   . Allergy   . H/O Bell's palsy     rt sided  . Anxiety   . Migraine aura, persistent     PAST SURGICAL HISTORY: Past Surgical History  Procedure Laterality Date  . Cesarean section  2010    FAMILY HISTORY: Family History  Problem Relation Age of Onset  . CAD Paternal Grandfather 75  .  CAD Paternal Grandmother 62  . Diabetes Maternal Grandmother   . Diabetes Maternal Grandfather   . Hypertension Brother     SOCIAL HISTORY:  History   Social History  . Marital Status: Married    Spouse Name: Gerald Stabs  . Number of Children: 3  . Years of Education: 12   Occupational History  . Not on file.   Social History Main Topics  .  Smoking status: Never Smoker   . Smokeless tobacco: Never Used  . Alcohol Use: No  . Drug Use: No  . Sexual Activity: Yes     Comment: married   Other Topics Concern  . Not on file   Social History Narrative   Works as a Geophysicist/field seismologist.  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       PHYSICAL EXAM  GENERAL EXAM/CONSTITUTIONAL: Vitals:  Filed Vitals:   06/25/14 0915  BP: 129/87  Pulse: 87  Height: 5' 5"  (1.651 m)  Weight: 191 lb 3.2 oz (86.728 kg)     Body mass index is 31.82 kg/(m^2).  Visual Acuity Screening   Right eye Left eye Both eyes  Without correction: 20/40 20/30   With correction:        Patient is in no distress; well developed, nourished and groomed; neck is supple  CARDIOVASCULAR:  Examination of carotid arteries is normal; no carotid bruits  Regular rate and rhythm, no murmurs  Examination of peripheral vascular system by observation and palpation is normal  EYES:  Ophthalmoscopic exam of optic discs and posterior segments is normal; no papilledema or hemorrhages  MUSCULOSKELETAL:  Gait, strength, tone, movements noted in Neurologic exam below  NEUROLOGIC: MENTAL STATUS:  No flowsheet data found.  awake, alert, oriented to person, place and time  recent and remote memory intact  normal attention and concentration  language fluent, comprehension intact, naming intact,   fund of knowledge appropriate  CRANIAL NERVE:   2nd - no papilledema on fundoscopic exam  2nd, 3rd, 4th, 6th - pupils equal and reactive to light, visual fields full to confrontation, extraocular muscles intact, no nystagmus  5th - facial sensation symmetric  7th - SYNKINESIS ON RIGHT FACE; INTERMITTENT RIGHT HEMIFACIAL SPASM  8th - hearing intact  9th - palate elevates symmetrically, uvula midline  11th - shoulder shrug symmetric  12th - tongue protrusion midline  MOTOR:   normal bulk and tone, full strength in the BUE, BLE  SENSORY:    normal and symmetric to light touch, pinprick, temperature, vibration  COORDINATION:   finger-nose-finger, fine finger movements normal  REFLEXES:   deep tendon reflexes present and symmetric  GAIT/STATION:   narrow based gait; able to walk tandem; romberg is negative    DIAGNOSTIC DATA (LABS, IMAGING, TESTING) - I reviewed patient records, labs, notes, testing and imaging myself where available.  Lab Results  Component Value Date   WBC 6.6 04/26/2014   HGB 13.0 04/26/2014   HCT 39.7 04/26/2014   MCV 79.7* 04/26/2014   PLT 183 06/20/2008      Component Value Date/Time   NA 138 04/26/2014 1414   K 4.5 04/26/2014 1414   CL 103 04/26/2014 1414   CO2 25 04/26/2014 1414   GLUCOSE 86 04/26/2014 1414   BUN 8 04/26/2014 1414   CREATININE 0.61 04/26/2014 1414   CREATININE 0.40 03/10/2008 1417   CALCIUM 8.8 04/26/2014 1414   PROT 6.5 04/26/2014 1414   ALBUMIN 3.8 04/26/2014 1414   AST 19 04/26/2014 1414   ALT  19 04/26/2014 1414   ALKPHOS 39 04/26/2014 1414   BILITOT 0.5 04/26/2014 1414   GFRNONAA >60 03/10/2008 1417   GFRAA  03/10/2008 1417    >60        The eGFR has been calculated using the MDRD equation. This calculation has not been validated in all clinical situations. eGFR's persistently <60 mL/min signify possible Chronic Kidney Disease.   Lab Results  Component Value Date   CHOL 141 04/26/2014   HDL 47 04/26/2014   LDLCALC 69 04/26/2014   TRIG 127 04/26/2014   CHOLHDL 3.0 04/26/2014   No results found for: HGBA1C No results found for: VITAMINB12 Lab Results  Component Value Date   TSH 1.933 04/26/2014    02/01/14 CT HEAD - normal [I reviewed images myself and agree with interpretation. -VRP]     ASSESSMENT AND PLAN  39 y.o. year old female here with migraine with aura since 2010; worsening in the last 6 months without triggering factor. Also with significant weight gain in last 6 months (35lbs).   Ddx: migraine with aura (likely) vs  secondary cause (IIH, pituitary lesion)  PLAN: - MRI brain for secondary HA evaluation - TPX + rizatriptan prn - headache diary  Orders Placed This Encounter  Procedures  . MR Brain W Wo Contrast   Meds ordered this encounter  Medications  . topiramate (TOPAMAX) 50 MG tablet    Sig: Take 1 tablet (50 mg total) by mouth 2 (two) times daily.    Dispense:  60 tablet    Refill:  12  . rizatriptan (MAXALT-MLT) 10 MG disintegrating tablet    Sig: Take 1 tablet (10 mg total) by mouth as needed for migraine. May repeat in 2 hours if needed    Dispense:  9 tablet    Refill:  11   Return in about 6 weeks (around 08/06/2014).    Penni Bombard, MD 3/69/2230, 09:79 AM Certified in Neurology, Neurophysiology and Neuroimaging  [facility] [address] [place], [place] [address] [phone]

## 2014-06-25 NOTE — Patient Instructions (Signed)
Start topiramate 50mg  at bedtime x 1-2 weeks, then increase to twice a day.  Use rizatriptan as needed for breakthrough migraine.

## 2014-07-06 ENCOUNTER — Other Ambulatory Visit: Payer: Self-pay | Admitting: Physician Assistant

## 2014-07-14 ENCOUNTER — Ambulatory Visit (INDEPENDENT_AMBULATORY_CARE_PROVIDER_SITE_OTHER): Payer: BC Managed Care – PPO

## 2014-07-14 DIAGNOSIS — G43109 Migraine with aura, not intractable, without status migrainosus: Secondary | ICD-10-CM | POA: Diagnosis not present

## 2014-07-14 DIAGNOSIS — R42 Dizziness and giddiness: Secondary | ICD-10-CM

## 2014-07-15 MED ORDER — GADOPENTETATE DIMEGLUMINE 469.01 MG/ML IV SOLN: 19.0000 mL | Freq: Once | INTRAVENOUS | Status: AC | PRN

## 2014-08-10 ENCOUNTER — Encounter: Payer: Self-pay | Admitting: Diagnostic Neuroimaging

## 2014-08-10 ENCOUNTER — Ambulatory Visit (INDEPENDENT_AMBULATORY_CARE_PROVIDER_SITE_OTHER): Payer: BC Managed Care – PPO | Admitting: Diagnostic Neuroimaging

## 2014-08-10 VITALS — BP 111/68 | HR 83 | Ht 65.0 in | Wt 188.2 lb

## 2014-08-10 DIAGNOSIS — G43109 Migraine with aura, not intractable, without status migrainosus: Secondary | ICD-10-CM

## 2014-08-10 DIAGNOSIS — R42 Dizziness and giddiness: Secondary | ICD-10-CM | POA: Diagnosis not present

## 2014-08-10 MED ORDER — RIZATRIPTAN BENZOATE 10 MG PO TBDP: 10.0000 mg | ORAL_TABLET | ORAL | Status: DC | PRN

## 2014-08-10 MED ORDER — TOPIRAMATE 50 MG PO TABS: 50.0000 mg | ORAL_TABLET | Freq: Two times a day (BID) | ORAL | Status: DC

## 2014-08-10 NOTE — Progress Notes (Addendum)
[facility]  PATIENT: Kylie Galloway DOB: 1975/01/28  REFERRING CLINICIAN: Guest HISTORY FROM: patient  REASON FOR VISIT: follow up   HISTORICAL  CHIEF COMPLAINT:  Chief Complaint  Patient presents with  . Migraine    rm 7, migraines x 3 since last appt  . Follow-up    HISTORY OF PRESENT ILLNESS:   UPDATE 08/10/14: Since last visit, doing better. Only 3 days of migraine since last visit. Tolerating TPX. Rizatriptan also working well. Overall doing well. MRI brain was normal. Still no specific triggers. Incidentally patient developed small rash on right perioral region 1 week ago, which started out as "cold sore". Patient denies any burning, numbness or pain with this rash. Rash seems to be stabilized and no longer expanding.  PRIOR HPI (52/79): 39 year old right-handed female here for evaluation of headaches, dizziness, visual changes. Since 2010 patient has had intermittent episodes of headache, frontal, occipital, sometimes unilateral pressure and throbbing sensation with nausea, photophobia, proceeded by seeing spots and vision changes. She was having 1 or less episodes per month. Gradually these increased uptake 3-4 per month. In 2016 she has had 5 episodes. Recent episodes have changed to include severe dizzy, spinning sensation, lasting much longer than previously. Prior episodes used the last 1-2 hours at a time. Now episodes can last 5 hours up to 2-3 days at a time. No prior diagnosis of migraine. Patient's daughter has some headaches but not officially diagnosed. Patient denies any specific triggering or aggravating factors. No food, caffeine, hormonal, sleep or stress triggers. Patient has tried Motrin, BC powder, Tylenol, Aleve, Excedrin migraine in the past for headache treatment, with mild relief. Patient has remote history of Bell's palsy on the right side 2003 with incomplete recovery. Patient has had intermittent bilateral hand numbness in the  past.   REVIEW OF SYSTEMS: Full 14 system review of systems performed and notable only for weight gain fatigue swelling in legs being sensation constipation blurred vision feeling hot increased thirst flushing dizziness headache numbness restless legs.   ALLERGIES: No Known Allergies  HOME MEDICATIONS: Outpatient Prescriptions Prior to Visit  Medication Sig Dispense Refill  . bisoprolol (ZEBETA) 5 MG tablet Take 1 tablet (5 mg total) by mouth daily. 30 tablet 3  . desogestrel-ethinyl estradiol (APRI,EMOQUETTE,SOLIA) 0.15-30 MG-MCG tablet Take 1 tablet by mouth daily.    . hydrochlorothiazide (HYDRODIURIL) 25 MG tablet Take 1 tablet (25 mg total) by mouth daily. 90 tablet 3  . ibuprofen (ADVIL,MOTRIN) 600 MG tablet Take 1 tablet (600 mg total) by mouth every 8 (eight) hours as needed. 30 tablet 2  . meclizine (ANTIVERT) 50 MG tablet Take 0.5 tablets (25 mg total) by mouth 3 (three) times daily as needed. 30 tablet 0  . naproxen (NAPROSYN) 500 MG tablet Take 1 tablet (500 mg total) by mouth 2 (two) times daily with a meal. 30 tablet 1  . phentermine (ADIPEX-P) 37.5 MG tablet Take 37.5 mg by mouth daily before breakfast.    . rizatriptan (MAXALT-MLT) 10 MG disintegrating tablet Take 1 tablet (10 mg total) by mouth as needed for migraine. May repeat in 2 hours if needed 9 tablet 11  . topiramate (TOPAMAX) 50 MG tablet Take 1 tablet (50 mg total) by mouth 2 (two) times daily. 60 tablet 12  . HYDROcodone-acetaminophen (NORCO/VICODIN) 5-325 MG per tablet Take 1 tablet by mouth every 6 (six) hours as needed. (Patient not taking: Reported on 06/25/2014) 30 tablet 0  . ondansetron (ZOFRAN) 8 MG tablet Take 1 tablet (  8 mg total) by mouth every 8 (eight) hours as needed for nausea or vomiting. (Patient not taking: Reported on 06/25/2014) 20 tablet 0   No facility-administered medications prior to visit.    PAST MEDICAL HISTORY: Past Medical History  Diagnosis Date  . Hypertension   . Allergy   .  H/O Bell's palsy     rt sided  . Anxiety   . Migraine aura, persistent     PAST SURGICAL HISTORY: Past Surgical History  Procedure Laterality Date  . Cesarean section  2010    FAMILY HISTORY: Family History  Problem Relation Age of Onset  . CAD Paternal Grandfather 54  . CAD Paternal Grandmother 57  . Diabetes Maternal Grandmother   . Diabetes Maternal Grandfather   . Hypertension Brother     SOCIAL HISTORY:  History   Social History  . Marital Status: Married    Spouse Name: Gerald Stabs  . Number of Children: 3  . Years of Education: 12   Occupational History  . Not on file.   Social History Main Topics  . Smoking status: Never Smoker   . Smokeless tobacco: Never Used  . Alcohol Use: No  . Drug Use: No  . Sexual Activity: Yes     Comment: married   Other Topics Concern  . Not on file   Social History Narrative   Works as a Geophysicist/field seismologist.  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       PHYSICAL EXAM  GENERAL EXAM/CONSTITUTIONAL: Vitals:  Filed Vitals:   08/10/14 1532  BP: 111/68  Pulse: 83  Height: 5' 5"  (1.651 m)  Weight: 188 lb 3.2 oz (85.367 kg)   Wt Readings from Last 3 Encounters:  08/10/14 188 lb 3.2 oz (85.367 kg)  07/13/14 191 lb (86.637 kg)  06/25/14 191 lb 3.2 oz (86.728 kg)     Body mass index is 31.32 kg/(m^2). No exam data present  Patient is in no distress; well developed, nourished and groomed; neck is supple  RED VESICULAR RASH IN THE RIGHT V3 PERI-ORAL REGION  CARDIOVASCULAR:  Examination of carotid arteries is normal; no carotid bruits  Regular rate and rhythm, no murmurs  Examination of peripheral vascular system by observation and palpation is normal  EYES:  Ophthalmoscopic exam of optic discs and posterior segments is normal; no papilledema or hemorrhages  MUSCULOSKELETAL:  Gait, strength, tone, movements noted in Neurologic exam below  NEUROLOGIC: MENTAL STATUS:  No flowsheet data found.  awake,  alert, oriented to person, place and time  recent and remote memory intact  normal attention and concentration  language fluent, comprehension intact, naming intact,   fund of knowledge appropriate  CRANIAL NERVE:   2nd - no papilledema on fundoscopic exam  2nd, 3rd, 4th, 6th - pupils equal and reactive to light, visual fields full to confrontation, extraocular muscles intact, no nystagmus  5th - facial sensation symmetric  7th - SYNKINESIS ON RIGHT FACE; INTERMITTENT RIGHT HEMIFACIAL SPASM  8th - hearing intact  9th - palate elevates symmetrically, uvula midline  11th - shoulder shrug symmetric  12th - tongue protrusion midline  MOTOR:   normal bulk and tone, full strength in the BUE, BLE  SENSORY:   normal and symmetric to light touch, pinprick, temperature, vibration  COORDINATION:   finger-nose-finger, fine finger movements normal  REFLEXES:   deep tendon reflexes present and symmetric  GAIT/STATION:   narrow based gait; able to walk tandem; romberg is negative     DIAGNOSTIC DATA (  LABS, IMAGING, TESTING) - I reviewed patient records, labs, notes, testing and imaging myself where available.  Lab Results  Component Value Date   WBC 6.6 04/26/2014   HGB 13.0 04/26/2014   HCT 39.7 04/26/2014   MCV 79.7* 04/26/2014   PLT 183 06/20/2008      Component Value Date/Time   NA 138 04/26/2014 1414   K 4.5 04/26/2014 1414   CL 103 04/26/2014 1414   CO2 25 04/26/2014 1414   GLUCOSE 86 04/26/2014 1414   BUN 8 04/26/2014 1414   CREATININE 0.61 04/26/2014 1414   CREATININE 0.40 03/10/2008 1417   CALCIUM 8.8 04/26/2014 1414   PROT 6.5 04/26/2014 1414   ALBUMIN 3.8 04/26/2014 1414   AST 19 04/26/2014 1414   ALT 19 04/26/2014 1414   ALKPHOS 39 04/26/2014 1414   BILITOT 0.5 04/26/2014 1414   GFRNONAA >60 03/10/2008 1417   GFRAA  03/10/2008 1417    >60        The eGFR has been calculated using the MDRD equation. This calculation has not  been validated in all clinical situations. eGFR's persistently <60 mL/min signify possible Chronic Kidney Disease.   Lab Results  Component Value Date   CHOL 141 04/26/2014   HDL 47 04/26/2014   LDLCALC 69 04/26/2014   TRIG 127 04/26/2014   CHOLHDL 3.0 04/26/2014   No results found for: HGBA1C No results found for: VITAMINB12 Lab Results  Component Value Date   TSH 1.933 04/26/2014    02/01/14 CT HEAD - normal [I reviewed images myself and agree with interpretation. -VRP]     ASSESSMENT AND PLAN  39 y.o. year old female here with migraine with aura since 2010; worsening in the last 6 months without triggering factor. Also with significant weight gain in 2016. Migraines are improved since starting topiramate and triptan. We'll continue therapy. Incidentally noted is vesicular rash in the right V3 perioral region, may represent viral eruption, cold sore, or shingles infection/outbreak. Lack of significant pain or burning medication is less likely. Advised patient to monitor symptoms and if significant pain or expansion of rash occurs, follow-up with PCP for further evaluation.  Dx: migraine with aura  PLAN: - continue TPX + rizatriptan prn - headache diary  Return in about 4 months (around 12/10/2014).    Penni Bombard, MD 07/15/9394, 8:86 PM Certified in Neurology, Neurophysiology and Neuroimaging  [facility] [address], [place], [place] [address] [phone]

## 2014-09-13 ENCOUNTER — Other Ambulatory Visit: Payer: Self-pay | Admitting: Physician Assistant

## 2014-10-12 ENCOUNTER — Encounter: Payer: Self-pay | Admitting: Emergency Medicine

## 2014-11-20 ENCOUNTER — Other Ambulatory Visit: Payer: Self-pay | Admitting: Physician Assistant

## 2014-11-28 ENCOUNTER — Ambulatory Visit (INDEPENDENT_AMBULATORY_CARE_PROVIDER_SITE_OTHER): Payer: BC Managed Care – PPO | Admitting: Internal Medicine

## 2014-11-28 VITALS — BP 118/76 | HR 79 | Temp 98.3°F | Resp 16 | Ht 65.0 in | Wt 184.0 lb

## 2014-11-28 DIAGNOSIS — J019 Acute sinusitis, unspecified: Secondary | ICD-10-CM

## 2014-11-28 MED ORDER — AMOXICILLIN 875 MG PO TABS: 875.0000 mg | ORAL_TABLET | Freq: Two times a day (BID) | ORAL | Status: DC

## 2014-11-28 NOTE — Progress Notes (Signed)
Subjective:  This chart was scribed for Kylie Sia, MD by [facility], medical scribe at [facility].The patient was seen in exam room 04 and the patient's care was started at 10:32 AM.   Patient ID: Kylie Galloway, female    DOB: 1975/02/07, 39 y.o.   MRN: [id number] Chief Complaint  Patient presents with  . Sore Throat    x 3 weeks   . Headache  . Sinus Problem   HPI  HPI Comments: Kylie Galloway is a 39 y.o. female who presents to [facility] complaining of sinus congestion  and post nasal drip. She does feel that her ears are blocked. Sick contacts at home, both children are here for similar complaints. No cough, fever. Symptoms getting steadily worse over the last 10 days and she has an upcoming trip to [place]. Mucus now purulent.  Past Medical History  Diagnosis Date  . Hypertension   . Allergy   . H/O Bell's palsy     rt sided  . Anxiety   . Migraine aura, persistent    Prior to Admission medications   Medication Sig Start Date End Date Taking? Authorizing Provider  bisoprolol (ZEBETA) 5 MG tablet TAKE 1 TABLET BY MOUTH DAILY  "OFFICE VISIT NEEDED FOR REFILLS" 11/20/14  Yes Chelle Jeffery, PA-C  desogestrel-ethinyl estradiol (APRI,EMOQUETTE,SOLIA) 0.15-30 MG-MCG tablet Take 1 tablet by mouth daily.   Yes Historical Provider, MD  hydrochlorothiazide (HYDRODIURIL) 25 MG tablet Take 1 tablet (25 mg total) by mouth daily. 04/26/14  Yes Tishira R Brewington, PA-C  ibuprofen (ADVIL,MOTRIN) 600 MG tablet Take 1 tablet (600 mg total) by mouth every 8 (eight) hours as needed. 02/20/14  Yes Tishira R Brewington, PA-C  meclizine (ANTIVERT) 50 MG tablet Take 0.5 tablets (25 mg total) by mouth 3 (three) times daily as needed. 05/28/14  Yes Toy Cookey, MD  ondansetron (ZOFRAN) 8 MG tablet Take 1 tablet (8 mg total) by mouth every 8 (eight) hours as needed for nausea or vomiting. 05/31/14  Yes Jonita Albee, MD  phentermine (ADIPEX-P) 37.5 MG  tablet Take 37.5 mg by mouth daily before breakfast.   Yes Historical Provider, MD  rizatriptan (MAXALT-MLT) 10 MG disintegrating tablet Take 1 tablet (10 mg total) by mouth as needed for migraine. May repeat in 2 hours if needed 08/10/14  Yes Suanne Marker, MD  topiramate (TOPAMAX) 50 MG tablet Take 1 tablet (50 mg total) by mouth 2 (two) times daily. 08/10/14  Yes Suanne Marker, MD   No Known Allergies  Review of Systems  Constitutional: Negative for fever.  HENT: Positive for postnasal drip and sinus pressure.   Respiratory: Negative for cough.   Neurological: Positive for headaches.      Objective:  BP 118/76 mmHg  Pulse 79  Temp(Src) 98.3 F (36.8 C) (Oral)  Resp 16  Ht  (1.651 m)  Wt 184 lb (83.462 kg)  BMI 30.62 kg/m2  SpO2 99%  LMP 11/21/2014 (Approximate) Physical Exam  Constitutional: She is oriented to person, place, and time. She appears well-developed and well-nourished. No distress.  HENT:  Head: Normocephalic and atraumatic.  Right Ear: External ear normal.  Left Ear: External ear normal.  Mouth/Throat: Oropharynx is clear and moist.  Turbinates are boggy and there is bilateral purulent mucus also seen in the back of the throat  Eyes: Conjunctivae are normal. Pupils are equal, round, and reactive to light.  Neck: Normal range of motion.  Cardiovascular: Normal rate, regular rhythm and normal heart sounds.   Pulmonary/Chest: Effort normal and breath sounds normal. No respiratory distress.  Musculoskeletal: Normal range of motion.  Lymphadenopathy:    She has no cervical adenopathy.  Neurological: She is alert and oriented to person, place, and time.  Skin: Skin is warm and dry.  Psychiatric: She has a normal mood and affect. Her behavior is normal.  Nursing note and vitals reviewed.     Assessment & Plan:  Acute sinusitis, recurrence not specified, unspecified location  Meds ordered this encounter  Medications  . amoxicillin (AMOXIL) 875 MG  tablet    Sig: Take 1 tablet (875 mg total) by mouth 2 (two) times daily.    Dispense:  20 tablet    Refill:  0   Sudafed 12h for 3-4 days flonase if desired  I have completed the patient encounter in its entirety as documented by the scribe, with editing by me where necessary. Emmakate Hypes P. Merla Richesoolittle, [name]   By signing my name below, I, Nadim Abuhashem, attest that this documentation has been prepared under the direction and in the presence of Kylie Siaobert Kippy Gohman, MD.  Electronically Signed: Conchita ParisNadim Abuhashem, medical scribe. 11/28/2014, 10:32 AM.

## 2014-12-16 ENCOUNTER — Encounter: Payer: Self-pay | Admitting: Diagnostic Neuroimaging

## 2014-12-16 ENCOUNTER — Ambulatory Visit (INDEPENDENT_AMBULATORY_CARE_PROVIDER_SITE_OTHER): Payer: BC Managed Care – PPO | Admitting: Diagnostic Neuroimaging

## 2014-12-16 VITALS — BP 115/77 | HR 83 | Ht 65.0 in | Wt 185.6 lb

## 2014-12-16 DIAGNOSIS — G43109 Migraine with aura, not intractable, without status migrainosus: Secondary | ICD-10-CM

## 2014-12-16 MED ORDER — RIZATRIPTAN BENZOATE 10 MG PO TBDP: 10.0000 mg | ORAL_TABLET | ORAL | Status: DC | PRN

## 2014-12-16 MED ORDER — TOPIRAMATE 50 MG PO TABS: 50.0000 mg | ORAL_TABLET | Freq: Two times a day (BID) | ORAL | Status: DC

## 2014-12-16 NOTE — Patient Instructions (Signed)

## 2014-12-16 NOTE — Progress Notes (Signed)
[facility]  PATIENT: Kylie Galloway DOB: 06-19-1975  REFERRING CLINICIAN: Guest HISTORY FROM: patient  REASON FOR VISIT: follow up   HISTORICAL  CHIEF COMPLAINT:  Chief Complaint  Patient presents with  . Follow-up    migraine    HISTORY OF PRESENT ILLNESS:   UPDATE 12/16/14: Since last visit 5-8 migraines, likely weather related. No other issues. Overall doing well.  UPDATE 08/10/14: Since last visit, doing better. Only 3 days of migraine since last visit. Tolerating TPX. Rizatriptan also working well. Overall doing well. MRI brain was normal. Still no specific triggers. Incidentally patient developed small rash on right perioral region 1 week ago, which started out as "cold sore". Patient denies any burning, numbness or pain with this rash. Rash seems to be stabilized and no longer expanding.  PRIOR HPI (06/25/14): 39 year old right-handed female here for evaluation of headaches, dizziness, visual changes. Since 2010 patient has had intermittent episodes of headache, frontal, occipital, sometimes unilateral pressure and throbbing sensation with nausea, photophobia, proceeded by seeing spots and vision changes. She was having 1 or less episodes per month. Gradually these increased uptake 3-4 per month. In 2016 she has had 5 episodes. Recent episodes have changed to include severe dizzy, spinning sensation, lasting much longer than previously. Prior episodes used the last 1-2 hours at a time. Now episodes can last 5 hours up to 2-3 days at a time. No prior diagnosis of migraine. Patient's daughter has some headaches but not officially diagnosed. Patient denies any specific triggering or aggravating factors. No food, caffeine, hormonal, sleep or stress triggers. Patient has tried Motrin, BC powder, Tylenol, Aleve, Excedrin migraine in the past for headache treatment, with mild relief. Patient has remote history of Bell's palsy on the right side 2003 with incomplete recovery.  Patient has had intermittent bilateral hand numbness in the past.   REVIEW OF SYSTEMS: Full 14 system review of systems performed and notable only for fatigue flushing wt gain.    ALLERGIES: No Known Allergies  HOME MEDICATIONS: Outpatient Prescriptions Prior to Visit  Medication Sig Dispense Refill  . bisoprolol (ZEBETA) 5 MG tablet TAKE 1 TABLET BY MOUTH DAILY  "OFFICE VISIT NEEDED FOR REFILLS" 30 tablet 0  . desogestrel-ethinyl estradiol (APRI,EMOQUETTE,SOLIA) 0.15-30 MG-MCG tablet Take 1 tablet by mouth daily.    . hydrochlorothiazide (HYDRODIURIL) 25 MG tablet Take 1 tablet (25 mg total) by mouth daily. 90 tablet 3  . ibuprofen (ADVIL,MOTRIN) 600 MG tablet Take 1 tablet (600 mg total) by mouth every 8 (eight) hours as needed. 30 tablet 2  . meclizine (ANTIVERT) 50 MG tablet Take 0.5 tablets (25 mg total) by mouth 3 (three) times daily as needed. 30 tablet 0  . ondansetron (ZOFRAN) 8 MG tablet Take 1 tablet (8 mg total) by mouth every 8 (eight) hours as needed for nausea or vomiting. 20 tablet 0  . phentermine (ADIPEX-P) 37.5 MG tablet Take 37.5 mg by mouth daily before breakfast.    . rizatriptan (MAXALT-MLT) 10 MG disintegrating tablet Take 1 tablet (10 mg total) by mouth as needed for migraine. May repeat in 2 hours if needed 9 tablet 11  . topiramate (TOPAMAX) 50 MG tablet Take 1 tablet (50 mg total) by mouth 2 (two) times daily. 60 tablet 12  . amoxicillin (AMOXIL) 875 MG tablet Take 1 tablet (875 mg total) by mouth 2 (two) times daily. 20 tablet 0   No facility-administered medications prior to visit.    PAST MEDICAL HISTORY: Past Medical History  Diagnosis  Date  . Hypertension   . Allergy   . H/O Bell's palsy     rt sided  . Anxiety   . Migraine aura, persistent     PAST SURGICAL HISTORY: Past Surgical History  Procedure Laterality Date  . Cesarean section  2010    FAMILY HISTORY: Family History  Problem Relation Age of Onset  . CAD Paternal Grandfather 69  .  CAD Paternal Grandmother 2  . Diabetes Maternal Grandmother   . Diabetes Maternal Grandfather   . Hypertension Brother     SOCIAL HISTORY:  Social History   Social History  . Marital Status: Married    Spouse Name: Gerald Stabs  . Number of Children: 3  . Years of Education: 12   Occupational History  . Not on file.   Social History Main Topics  . Smoking status: Never Smoker   . Smokeless tobacco: Never Used  . Alcohol Use: No  . Drug Use: No  . Sexual Activity: Yes     Comment: married   Other Topics Concern  . Not on file   Social History Narrative   Works as a Geophysicist/field seismologist.  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       PHYSICAL EXAM  GENERAL EXAM/CONSTITUTIONAL: Vitals:  Filed Vitals:   12/16/14 1540  BP: 115/77  Pulse: 83  Height: 5' 5"  (1.651 m)  Weight: 185 lb 9.6 oz (84.188 kg)   Wt Readings from Last 3 Encounters:  12/16/14 185 lb 9.6 oz (84.188 kg)  11/28/14 184 lb (83.462 kg)  08/10/14 188 lb 3.2 oz (85.367 kg)     Body mass index is 30.89 kg/(m^2). No exam data present  Patient is in no distress; well developed, nourished and groomed; neck is supple   CARDIOVASCULAR:  Examination of carotid arteries is normal; no carotid bruits  Regular rate and rhythm, no murmurs  Examination of peripheral vascular system by observation and palpation is normal  EYES:  Ophthalmoscopic exam of optic discs and posterior segments is normal; no papilledema or hemorrhages  MUSCULOSKELETAL:  Gait, strength, tone, movements noted in Neurologic exam below  NEUROLOGIC: MENTAL STATUS:  No flowsheet data found.  awake, alert, oriented to person, place and time  recent and remote memory intact  normal attention and concentration  language fluent, comprehension intact, naming intact,   fund of knowledge appropriate  CRANIAL NERVE:   2nd - no papilledema on fundoscopic exam  2nd, 3rd, 4th, 6th - pupils equal and reactive to light, visual  fields full to confrontation, extraocular muscles intact, no nystagmus  5th - facial sensation symmetric  7th - SYNKINESIS ON RIGHT FACE; INTERMITTENT RIGHT HEMIFACIAL SPASM  8th - hearing intact  9th - palate elevates symmetrically, uvula midline  11th - shoulder shrug symmetric  12th - tongue protrusion midline  MOTOR:   normal bulk and tone, full strength in the BUE, BLE  SENSORY:   normal and symmetric to light touch, temperature, vibration  COORDINATION:   finger-nose-finger, fine finger movements normal  REFLEXES:   deep tendon reflexes present and symmetric  GAIT/STATION:   narrow based gait; romberg is negative     DIAGNOSTIC DATA (LABS, IMAGING, TESTING) - I reviewed patient records, labs, notes, testing and imaging myself where available.  Lab Results  Component Value Date   WBC 6.6 04/26/2014   HGB 13.0 04/26/2014   HCT 39.7 04/26/2014   MCV 79.7* 04/26/2014   PLT 183 06/20/2008      Component Value Date/Time  NA 138 04/26/2014 1414   K 4.5 04/26/2014 1414   CL 103 04/26/2014 1414   CO2 25 04/26/2014 1414   GLUCOSE 86 04/26/2014 1414   BUN 8 04/26/2014 1414   CREATININE 0.61 04/26/2014 1414   CREATININE 0.40 03/10/2008 1417   CALCIUM 8.8 04/26/2014 1414   PROT 6.5 04/26/2014 1414   ALBUMIN 3.8 04/26/2014 1414   AST 19 04/26/2014 1414   ALT 19 04/26/2014 1414   ALKPHOS 39 04/26/2014 1414   BILITOT 0.5 04/26/2014 1414   GFRNONAA >60 03/10/2008 1417   GFRAA  03/10/2008 1417    >60        The eGFR has been calculated using the MDRD equation. This calculation has not been validated in all clinical situations. eGFR's persistently <60 mL/min signify possible Chronic Kidney Disease.   Lab Results  Component Value Date   CHOL 141 04/26/2014   HDL 47 04/26/2014   LDLCALC 69 04/26/2014   TRIG 127 04/26/2014   CHOLHDL 3.0 04/26/2014   No results found for: HGBA1C No results found for: VITAMINB12 Lab Results  Component Value Date     TSH 1.933 04/26/2014    02/01/14 CT HEAD - normal [I reviewed images myself and agree with interpretation. -VRP]     ASSESSMENT AND PLAN  39 y.o. year old female here with migraine with aura since 2010; worsening in the last 6 months without triggering factor. Also with significant weight gain in 2016. Migraines are improved since starting topiramate and triptan. We'll continue therapy.  Dx: Migraine with aura and without status migrainosus, not intractable   PLAN: - continue TPX + rizatriptan prn - headache diary  Meds ordered this encounter  Medications  . rizatriptan (MAXALT-MLT) 10 MG disintegrating tablet    Sig: Take 1 tablet (10 mg total) by mouth as needed for migraine. May repeat in 2 hours if needed    Dispense:  9 tablet    Refill:  11  . topiramate (TOPAMAX) 50 MG tablet    Sig: Take 1 tablet (50 mg total) by mouth 2 (two) times daily.    Dispense:  60 tablet    Refill:  12   Return in about 6 months (around 06/16/2015).    Penni Bombard, MD 25/01/8982, 2:10 PM Certified in Neurology, Neurophysiology and Neuroimaging  [facility] [address], [place], [place] [address] [phone]

## 2014-12-18 ENCOUNTER — Other Ambulatory Visit: Payer: Self-pay | Admitting: Physician Assistant

## 2014-12-21 ENCOUNTER — Ambulatory Visit: Payer: BC Managed Care – PPO | Admitting: Diagnostic Neuroimaging

## 2015-01-19 ENCOUNTER — Other Ambulatory Visit: Payer: Self-pay | Admitting: Emergency Medicine

## 2015-02-01 ENCOUNTER — Other Ambulatory Visit: Payer: Self-pay | Admitting: Emergency Medicine

## 2015-04-19 ENCOUNTER — Other Ambulatory Visit: Payer: Self-pay

## 2015-04-19 DIAGNOSIS — I10 Essential (primary) hypertension: Secondary | ICD-10-CM

## 2015-04-19 MED ORDER — HYDROCHLOROTHIAZIDE 25 MG PO TABS: 25.0000 mg | ORAL_TABLET | Freq: Every day | ORAL | Status: DC

## 2015-05-26 ENCOUNTER — Other Ambulatory Visit: Payer: Self-pay | Admitting: Physician Assistant

## 2015-06-11 ENCOUNTER — Other Ambulatory Visit: Payer: Self-pay | Admitting: Emergency Medicine

## 2015-06-23 ENCOUNTER — Ambulatory Visit (INDEPENDENT_AMBULATORY_CARE_PROVIDER_SITE_OTHER): Payer: BC Managed Care – PPO | Admitting: Diagnostic Neuroimaging

## 2015-06-23 ENCOUNTER — Encounter: Payer: Self-pay | Admitting: Diagnostic Neuroimaging

## 2015-06-23 VITALS — BP 126/83 | HR 81 | Ht 65.0 in | Wt 184.2 lb

## 2015-06-23 DIAGNOSIS — G43109 Migraine with aura, not intractable, without status migrainosus: Secondary | ICD-10-CM | POA: Diagnosis not present

## 2015-06-23 DIAGNOSIS — R42 Dizziness and giddiness: Secondary | ICD-10-CM

## 2015-06-23 MED ORDER — RIZATRIPTAN BENZOATE 10 MG PO TBDP: 10.0000 mg | ORAL_TABLET | ORAL | Status: DC | PRN

## 2015-06-23 MED ORDER — TOPIRAMATE 50 MG PO TABS: 50.0000 mg | ORAL_TABLET | Freq: Two times a day (BID) | ORAL | Status: DC

## 2015-06-23 NOTE — Progress Notes (Signed)
[facility]  PATIENT: Kylie Galloway DOB: [birth year]  REFERRING CLINICIAN: Guest HISTORY FROM: patient  REASON FOR VISIT: follow up   HISTORICAL  CHIEF COMPLAINT:  Chief Complaint  Patient presents with  . Migraine    rm 7, "a couple migraines since Dec, not lasting as long , well controlled; have only used Riztriptan x 3-4"  . Follow-up    6 month    HISTORY OF PRESENT ILLNESS:   UPDATE 06/23/15: Since last visit, doing well. HA are reduced. Only needed triptan 3-4 times in last 6 months. Tolerating TPX.   UPDATE 12/16/14: Since last visit 5-8 migraines, likely weather related. No other issues. Overall doing well.  UPDATE 08/10/14: Since last visit, doing better. Only 3 days of migraine since last visit. Tolerating TPX. Rizatriptan also working well. Overall doing well. MRI brain was normal. Still no specific triggers. Incidentally patient developed small rash on right perioral region 1 week ago, which started out as "cold sore". Patient denies any burning, numbness or pain with this rash. Rash seems to be stabilized and no longer expanding.  PRIOR HPI (06/25/14): 40 year old right-handed femalemale here for evaluation of headaches, dizziness, visual changes. Since 2010 patient has had intermittent episodes of headache, frontal, occipital, sometimes unilateral pressure and throbbing sensation with nausea, photophobia, proceeded by seeing spots and vision changes. She was having 1 or less episodes per month. Gradually these increased uptake 3-4 per month. In 2016 she has had 5 episodes. Recent episodes have changed to include severe dizzy, spinning sensation, lasting much longer than previously. Prior episodes used the last 1-2 hours at a time. Now episodes can last 5 hours up to 2-3 days at a time. No prior diagnosis of migraine. Patient's daughter has some headaches but not officially diagnosed. Patient denies any specific triggering or aggravating factors. No food,  caffeine, hormonal, sleep or stress triggers. Patient has tried Motrin, BC powder, Tylenol, Aleve, Excedrin migraine in the past for headache treatment, with mild relief. Patient has remote history of Bell's palsy on the right side 2003 with incomplete recovery. Patient has had intermittent bilateral hand numbness in the past.   REVIEW OF SYSTEMS: Full 14 system review of systems performed and negative except as per HPI.     ALLERGIES: No Known Allergies  HOME MEDICATIONS: Outpatient Prescriptions Prior to Visit  Medication Sig Dispense Refill  . bisoprolol (ZEBETA) 5 MG tablet TAKE 1 TAB BY MOUTH ONCE DAILY. NEEDS BLOOD PRESSURE CHECK UP FOR ADDITIONAL REFILLS 15 tablet 0  . desogestrel-ethinyl estradiol (APRI,EMOQUETTE,SOLIA) 0.15-30 MG-MCG tablet Take 1 tablet by mouth daily.    . hydrochlorothiazide (HYDRODIURIL) 25 MG tablet TAKE 1 TABLET (25 MG TOTAL) BY MOUTH DAILY. 15 tablet 0  . ibuprofen (ADVIL,MOTRIN) 600 MG tablet Take 1 tablet (600 mg total) by mouth every 8 (eight) hours as needed. 30 tablet 2  . meclizine (ANTIVERT) 50 MG tablet Take 0.5 tablets (25 mg total) by mouth 3 (three) times daily as needed. 30 tablet 0  . ondansetron (ZOFRAN) 8 MG tablet Take 1 tablet (8 mg total) by mouth every 8 (eight) hours as needed for nausea or vomiting. 20 tablet 0  . phentermine (ADIPEX-P) 37.5 MG tablet Take 37.5 mg by mouth daily before breakfast.    . rizatriptan (MAXALT-MLT) 10 MG disintegrating tablet Take 1 tablet (10 mg total) by mouth as needed for migraine. May repeat in 2 hours if needed 9 tablet 11  . topiramate (TOPAMAX) 50 MG tablet Take 1 tablet (50  mg total) by mouth 2 (two) times daily. 60 tablet 12   No facility-administered medications prior to visit.    PAST MEDICAL HISTORY: Past Medical History  Diagnosis Date  . Hypertension   . Allergy   . H/O Bell's palsy     rt sided  . Anxiety   . Migraine aura, persistent     PAST SURGICAL HISTORY: Past Surgical History    Procedure Laterality Date  . Cesarean section  2010    FAMILY HISTORY: Family History  Problem Relation Age of Onset  . CAD Paternal Grandfather 39  . CAD Paternal Grandmother 36  . Diabetes Maternal Grandmother   . Diabetes Maternal Grandfather   . Hypertension Brother     SOCIAL HISTORY:  Social History   Social History  . Marital Status: Married    Spouse Name: Gerald Stabs  . Number of Children: 3  . Years of Education: 12   Occupational History  . Not on file.   Social History Main Topics  . Smoking status: Never Smoker   . Smokeless tobacco: Never Used  . Alcohol Use: No  . Drug Use: No  . Sexual Activity: Yes     Comment: married   Other Topics Concern  . Not on file   Social History Narrative   Works as a Geophysicist/field seismologist.  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       PHYSICAL EXAM  GENERAL EXAM/CONSTITUTIONAL: Vitals:  Filed Vitals:   06/23/15 1336  BP: 126/83  Pulse: 81  Height: 5' 5"  (1.651 m)  Weight: 184 lb 3.2 oz (83.553 kg)   Wt Readings from Last 3 Encounters:  06/23/15 184 lb 3.2 oz (83.553 kg)  12/16/14 185 lb 9.6 oz (84.188 kg)  11/28/14 184 lb (83.462 kg)   Body mass index is 30.65 kg/(m^2). No exam data present  Patient is in no distress; well developed, nourished and groomed; neck is supple   CARDIOVASCULAR:  Examination of carotid arteries is normal; no carotid bruits  Regular rate and rhythm, no murmurs  Examination of peripheral vascular system by observation and palpation is normal  EYES:  Ophthalmoscopic exam of optic discs and posterior segments is normal; no papilledema or hemorrhages  MUSCULOSKELETAL:  Gait, strength, tone, movements noted in Neurologic exam below  NEUROLOGIC: MENTAL STATUS:  No flowsheet data found.  awake, alert, oriented to person, place and time  recent and remote memory intact  normal attention and concentration  language fluent, comprehension intact, naming intact,    fund of knowledge appropriate  CRANIAL NERVE:   2nd - no papilledema on fundoscopic exam  2nd, 3rd, 4th, 6th - pupils equal and reactive to light, visual fields full to confrontation, extraocular muscles intact, no nystagmus  5th - facial sensation symmetric  7th - SYNKINESIS ON RIGHT FACE; INTERMITTENT RIGHT HEMIFACIAL SPASM  8th - hearing intact  9th - palate elevates symmetrically, uvula midline  11th - shoulder shrug symmetric  12th - tongue protrusion midline  MOTOR:   normal bulk and tone, full strength in the BUE, BLE  SENSORY:   normal and symmetric to light touch, temperature, vibration  COORDINATION:   finger-nose-finger, fine finger movements normal  REFLEXES:   deep tendon reflexes present and symmetric  GAIT/STATION:   narrow based gait; romberg is negative     DIAGNOSTIC DATA (LABS, IMAGING, TESTING) - I reviewed patient records, labs, notes, testing and imaging myself where available.  Lab Results  Component Value Date   WBC 6.6  04/26/2014   HGB 13.0 04/26/2014   HCT 39.7 04/26/2014   MCV 79.7* 04/26/2014   PLT 183 06/20/2008      Component Value Date/Time   NA 138 04/26/2014 1414   K 4.5 04/26/2014 1414   CL 103 04/26/2014 1414   CO2 25 04/26/2014 1414   GLUCOSE 86 04/26/2014 1414   BUN 8 04/26/2014 1414   CREATININE 0.61 04/26/2014 1414   CREATININE 0.40 03/10/2008 1417   CALCIUM 8.8 04/26/2014 1414   PROT 6.5 04/26/2014 1414   ALBUMIN 3.8 04/26/2014 1414   AST 19 04/26/2014 1414   ALT 19 04/26/2014 1414   ALKPHOS 39 04/26/2014 1414   BILITOT 0.5 04/26/2014 1414   GFRNONAA >60 03/10/2008 1417   GFRAA  03/10/2008 1417    >60        The eGFR has been calculated using the MDRD equation. This calculation has not been validated in all clinical situations. eGFR's persistently <60 mL/min signify possible Chronic Kidney Disease.   Lab Results  Component Value Date   CHOL 141 04/26/2014   HDL 47 04/26/2014   LDLCALC 69  04/26/2014   TRIG 127 04/26/2014   CHOLHDL 3.0 04/26/2014   No results found for: HGBA1C No results found for: VITAMINB12 Lab Results  Component Value Date   TSH 1.933 04/26/2014    02/01/14 CT HEAD - normal [I reviewed images myself and agree with interpretation. -VRP]     ASSESSMENT AND PLAN  39 y.o. year old female here with migraine with aura since 2010; worsening in the last 6 months without triggering factor. Also with significant weight gain in 2016. Migraines are improved since starting topiramate and triptan. We'll continue therapy.   Dx:  Migraine with aura and without status migrainosus, not intractable  Dizziness and giddiness    PLAN: - continue TPX + rizatriptan prn - headache diary  Meds ordered this encounter  Medications  . topiramate (TOPAMAX) 50 MG tablet    Sig: Take 1 tablet (50 mg total) by mouth 2 (two) times daily.    Dispense:  180 tablet    Refill:  4  . rizatriptan (MAXALT-MLT) 10 MG disintegrating tablet    Sig: Take 1 tablet (10 mg total) by mouth as needed for migraine. May repeat in 2 hours if needed    Dispense:  9 tablet    Refill:  11   Return in about 1 year (around 06/22/2016).    Penni Bombard, MD 2/84/1324, 4:01 PM Certified in Neurology, Neurophysiology and Neuroimaging  [facility] [address], [place],  [address] [phone]

## 2015-12-25 DIAGNOSIS — G43909 Migraine, unspecified, not intractable, without status migrainosus: Secondary | ICD-10-CM | POA: Insufficient documentation

## 2016-05-17 ENCOUNTER — Encounter: Payer: Self-pay | Admitting: Diagnostic Neuroimaging

## 2016-06-25 ENCOUNTER — Ambulatory Visit: Payer: Self-pay | Admitting: Diagnostic Neuroimaging

## 2016-07-03 ENCOUNTER — Other Ambulatory Visit: Payer: Self-pay | Admitting: Diagnostic Neuroimaging

## 2016-09-18 ENCOUNTER — Ambulatory Visit: Payer: BC Managed Care – PPO | Admitting: Diagnostic Neuroimaging

## 2017-01-22 ENCOUNTER — Ambulatory Visit: Payer: BC Managed Care – PPO | Admitting: Diagnostic Neuroimaging

## 2017-03-25 ENCOUNTER — Ambulatory Visit: Payer: BC Managed Care – PPO | Admitting: Diagnostic Neuroimaging

## 2017-03-25 ENCOUNTER — Encounter: Payer: Self-pay | Admitting: Diagnostic Neuroimaging

## 2017-03-25 VITALS — BP 119/75 | HR 75 | Ht 65.0 in | Wt 189.4 lb

## 2017-03-25 DIAGNOSIS — R42 Dizziness and giddiness: Secondary | ICD-10-CM

## 2017-03-25 DIAGNOSIS — G43109 Migraine with aura, not intractable, without status migrainosus: Secondary | ICD-10-CM | POA: Diagnosis not present

## 2017-03-25 MED ORDER — TOPIRAMATE 50 MG PO TABS: 50.0000 mg | ORAL_TABLET | Freq: Two times a day (BID) | ORAL | 3 refills | Status: DC

## 2017-03-25 MED ORDER — AMITRIPTYLINE HCL 25 MG PO TABS: 25.0000 mg | ORAL_TABLET | Freq: Every day | ORAL | 6 refills | Status: DC

## 2017-03-25 MED ORDER — RIZATRIPTAN BENZOATE 10 MG PO TBDP: 10.0000 mg | ORAL_TABLET | ORAL | 11 refills | Status: DC | PRN

## 2017-03-25 NOTE — Progress Notes (Signed)
[facility]  PATIENT: Kylie Galloway DOB: 16-Apr-1975  REFERRING CLINICIAN: Guest HISTORY FROM: patient  REASON FOR VISIT: follow up   HISTORICAL  CHIEF COMPLAINT:  Chief Complaint  Patient presents with  . Follow-up  . Migraine    Migraines with vertigo since last seen.  Not doing as weel as last time.      HISTORY OF PRESENT ILLNESS:   UPDATE (03/25/17, VRP): Since last visit, was doing well until 3-4 months ago; now increasing HA 10-12 per month. Tolerating meds. No alleviating or aggravating factors. Sleep is suboptimal (5-6 hours per night or less).    UPDATE 06/23/15: Since last visit, doing well. HA are reduced. Only needed triptan 3-4 times in last 6 months. Tolerating TPX.   UPDATE 12/16/14: Since last visit 5-8 migraines, likely weather related. No other issues. Overall doing well.  UPDATE 08/10/14: Since last visit, doing better. Only 3 days of migraine since last visit. Tolerating TPX. Rizatriptan also working well. Overall doing well. MRI brain was normal. Still no specific triggers. Incidentally patient developed small rash on right perioral region 1 week ago, which started out as "cold sore". Patient denies any burning, numbness or pain with this rash. Rash seems to be stabilized and no longer expanding.  PRIOR HPI (06/25/14): 42 year old right-handed female here for evaluation of headaches, dizziness, visual changes. Since 2010 patient has had intermittent episodes of headache, frontal, occipital, sometimes unilateral pressure and throbbing sensation with nausea, photophobia, proceeded by seeing spots and vision changes. She was having 1 or less episodes per month. Gradually these increased uptake 3-4 per month. In 2016 she has had 5 episodes. Recent episodes have changed to include severe dizzy, spinning sensation, lasting much longer than previously. Prior episodes used the last 1-2 hours at a time. Now episodes can last 5 hours up to 2-3 days at a time.  No prior diagnosis of migraine. Patient's daughter has some headaches but not officially diagnosed. Patient denies any specific triggering or aggravating factors. No food, caffeine, hormonal, sleep or stress triggers. Patient has tried Motrin, BC powder, Tylenol, Aleve, Excedrin migraine in the past for headache treatment, with mild relief. Patient has remote history of Bell's palsy on the right side 2003 with incomplete recovery. Patient has had intermittent bilateral hand numbness in the past.   REVIEW OF SYSTEMS: Full 14 system review of systems performed and negative except: insomnia, stress, anxiety.     ALLERGIES: No Known Allergies  HOME MEDICATIONS: Outpatient Medications Prior to Visit  Medication Sig Dispense Refill  . bisoprolol (ZEBETA) 5 MG tablet TAKE 1 TAB BY MOUTH ONCE DAILY. NEEDS BLOOD PRESSURE CHECK UP FOR ADDITIONAL REFILLS 15 tablet 0  . desogestrel-ethinyl estradiol (APRI,EMOQUETTE,SOLIA) 0.15-30 MG-MCG tablet Take 1 tablet by mouth daily.    . hydrochlorothiazide (HYDRODIURIL) 25 MG tablet TAKE 1 TABLET (25 MG TOTAL) BY MOUTH DAILY. 15 tablet 0  . ibuprofen (ADVIL,MOTRIN) 600 MG tablet Take 1 tablet (600 mg total) by mouth every 8 (eight) hours as needed. 30 tablet 2  . meclizine (ANTIVERT) 50 MG tablet Take 0.5 tablets (25 mg total) by mouth 3 (three) times daily as needed. 30 tablet 0  . ondansetron (ZOFRAN) 8 MG tablet Take 1 tablet (8 mg total) by mouth every 8 (eight) hours as needed for nausea or vomiting. 20 tablet 0  . phentermine (ADIPEX-P) 37.5 MG tablet Take 37.5 mg by mouth daily before breakfast.    . rizatriptan (MAXALT-MLT) 10 MG disintegrating tablet Take 1 tablet (10  mg total) by mouth as needed for migraine. May repeat in 2 hours if needed 9 tablet 11  . topiramate (TOPAMAX) 50 MG tablet TAKE 1 TABLET BY MOUTH TWICE A DAY 180 tablet 3   No facility-administered medications prior to visit.     PAST MEDICAL HISTORY: Past Medical History:  Diagnosis  Date  . Allergy   . Anxiety   . H/O Bell's palsy    rt sided  . Hypertension   . Migraine aura, persistent     PAST SURGICAL HISTORY: Past Surgical History:  Procedure Laterality Date  . CESAREAN SECTION  2010    FAMILY HISTORY: Family History  Problem Relation Age of Onset  . CAD Paternal Grandfather 37  . CAD Paternal Grandmother 80  . Diabetes Maternal Grandmother   . Diabetes Maternal Grandfather   . Hypertension Brother     SOCIAL HISTORY:  Social History   Socioeconomic History  . Marital status: Married    Spouse name: Gerald Stabs  . Number of children: 3  . Years of education: 42  . Highest education level: Not on file  Social Needs  . Financial resource strain: Not on file  . Food insecurity - worry: Not on file  . Food insecurity - inability: Not on file  . Transportation needs - medical: Not on file  . Transportation needs - non-medical: Not on file  Occupational History  . Not on file  Tobacco Use  . Smoking status: Never Smoker  . Smokeless tobacco: Never Used  Substance and Sexual Activity  . Alcohol use: No  . Drug use: No  . Sexual activity: Yes    Comment: married  Other Topics Concern  . Not on file  Social History Narrative   Works as a Geophysicist/field seismologist.  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       PHYSICAL EXAM  GENERAL EXAM/CONSTITUTIONAL: Vitals:  Vitals:   03/25/17 1616  BP: 119/75  Pulse: 75  Weight: 189 lb 6.4 oz (85.9 kg)  Height: 5' 5"  (1.651 m)   Wt Readings from Last 3 Encounters:  03/25/17 189 lb 6.4 oz (85.9 kg)  06/23/15 184 lb 3.2 oz (83.6 kg)  12/16/14 185 lb 9.6 oz (84.2 kg)   Body mass index is 31.52 kg/m. No exam data present  Patient is in no distress; well developed, nourished and groomed; neck is supple   CARDIOVASCULAR:  Examination of carotid arteries is normal; no carotid bruits  Regular rate and rhythm, no murmurs  Examination of peripheral vascular system by observation and palpation  is normal  EYES:  Ophthalmoscopic exam of optic discs and posterior segments is normal; no papilledema or hemorrhages  MUSCULOSKELETAL:  Gait, strength, tone, movements noted in Neurologic exam below  NEUROLOGIC: MENTAL STATUS:  No flowsheet data found.  awake, alert, oriented to person, place and time  recent and remote memory intact  normal attention and concentration  language fluent, comprehension intact, naming intact,   fund of knowledge appropriate  CRANIAL NERVE:   2nd - no papilledema on fundoscopic exam  2nd, 3rd, 4th, 6th - pupils equal and reactive to light, visual fields full to confrontation, extraocular muscles intact, no nystagmus  5th - facial sensation symmetric  7th - SYNKINESIS ON RIGHT FACE  8th - hearing intact  9th - palate elevates symmetrically, uvula midline  11th - shoulder shrug symmetric  12th - tongue protrusion midline  MOTOR:   normal bulk and tone, full strength in the BUE, BLE  SENSORY:   normal and symmetric to light touch, temperature, vibration  COORDINATION:   finger-nose-finger, fine finger movements normal  REFLEXES:   deep tendon reflexes present and symmetric  GAIT/STATION:   narrow based gait     DIAGNOSTIC DATA (LABS, IMAGING, TESTING) - I reviewed patient records, labs, notes, testing and imaging myself where available.  Lab Results  Component Value Date   WBC 6.6 04/26/2014   HGB 13.0 04/26/2014   HCT 39.7 04/26/2014   MCV 79.7 (A) 04/26/2014   PLT 183 06/20/2008      Component Value Date/Time   NA 138 04/26/2014 1414   K 4.5 04/26/2014 1414   CL 103 04/26/2014 1414   CO2 25 04/26/2014 1414   GLUCOSE 86 04/26/2014 1414   BUN 8 04/26/2014 1414   CREATININE 0.61 04/26/2014 1414   CALCIUM 8.8 04/26/2014 1414   PROT 6.5 04/26/2014 1414   ALBUMIN 3.8 04/26/2014 1414   AST 19 04/26/2014 1414   ALT 19 04/26/2014 1414   ALKPHOS 39 04/26/2014 1414   BILITOT 0.5 04/26/2014 1414   GFRNONAA  >60 03/10/2008 1417   GFRAA  03/10/2008 1417    >60        The eGFR has been calculated using the MDRD equation. This calculation has not been validated in all clinical situations. eGFR's persistently <60 mL/min signify possible Chronic Kidney Disease.   Lab Results  Component Value Date   CHOL 141 04/26/2014   HDL 47 04/26/2014   LDLCALC 69 04/26/2014   TRIG 127 04/26/2014   CHOLHDL 3.0 04/26/2014   No results found for: HGBA1C No results found for: VITAMINB12 Lab Results  Component Value Date   TSH 1.933 04/26/2014    02/01/14 CT HEAD - normal [I reviewed images myself and agree with interpretation. -VRP]     ASSESSMENT AND PLAN  42 y.o. year old female here with migraine with aura since 2010; worsening in the last 6 months without triggering factor. Also with significant weight gain in 2016. Migraines are improved since starting topiramate and triptan. We'll continue therapy.   Dx:  Migraine with aura and without status migrainosus, not intractable  Dizziness and giddiness    PLAN:  MIGRAINE WITH AURA  - start amitriptyline 41m at bedtime - continue topiramate 532mtwice a day  - continue rizatriptan as needed  Meds ordered this encounter  Medications  . topiramate (TOPAMAX) 50 MG tablet    Sig: Take 1 tablet (50 mg total) by mouth 2 (two) times daily.    Dispense:  180 tablet    Refill:  3  . rizatriptan (MAXALT-MLT) 10 MG disintegrating tablet    Sig: Take 1 tablet (10 mg total) by mouth as needed for migraine. May repeat in 2 hours if needed    Dispense:  9 tablet    Refill:  11  . amitriptyline (ELAVIL) 25 MG tablet    Sig: Take 1 tablet (25 mg total) by mouth at bedtime.    Dispense:  30 tablet    Refill:  6   Return in about 6 months (around 09/25/2017).    VIPenni BombardMD 3/0/07/62264:3:33M Certified in Neurology, Neurophysiology and Neuroimaging  [facility] [address] DriveSuDowningrSan Ysidro[place]  [phone]

## 2017-03-25 NOTE — Patient Instructions (Addendum)
  MIGRAINE WITH AURA  - start amitriptyline 25mg  at bedtime - continue topiramate 50mg  twice a day  - continue rizatriptan as needed

## 2017-06-08 NOTE — Progress Notes (Addendum)
[facility] [place], [place], [place] [address] [phone]  Date:  06/10/2017   Name:  Kylie Galloway   DOB:  Sep 10, 1975   MRN:  [id number]  PCP:  Patient, No Pcp Per    Chief Complaint: Hypertension (2-3 weeks, worsening, heart palpitations)   History of Present Illness:  Kylie Galloway is a 41 y.o. very pleasant female patient who presents with the following:  History of hypertension, establishing care with [place] today- however I have met some of her family members and have taken care of her at [facility] in the past  She sees Dr. Leta Baptist for her migraine HA- she is on topamax, prn maxalt, and started on amitriptyline about 45 days ago   GYN care- [facility]  BP Readings from Last 3 Encounters:  06/10/17 136/70  03/25/17 119/75  06/23/15 126/83   She is on bisoprolol and hctz for her BP- has taken this regimen for years  She was dx with HTN many years ago per her report She also had heart palpitations years ago- saw cardiology and had a work-up of some type, told all looked ok  She was on phentermine several year ago for weight loss but no longer  About 2 weeks ago she noted heart palpitations and also started checking her BP She had a fluttering sensation quite a bit for a few days- would come in short bursts which might last for several hours.  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 Hypertension 01/24/2012    Past Medical History:   Diagnosis Date  . Allergy   . Anxiety   . H/O Bell's palsy    rt sided  . Hypertension   . Migraine aura, persistent     Past Surgical History:  Procedure Laterality Date  . CESAREAN SECTION  2010    Social History   Tobacco Use  . Smoking status: Never Smoker  . Smokeless tobacco: Never Used  Substance Use Topics  . Alcohol use: No  . Drug use: No    Family History  Problem Relation Age of Onset  . CAD Paternal Grandfather 84  . CAD Paternal Grandmother 45  . Diabetes Maternal Grandmother   . Diabetes Maternal Grandfather   . Hypertension Brother     No Known Allergies  Medication list has been reviewed and updated.  Current Outpatient Medications on File Prior to Visit  Medication Sig Dispense Refill  . amitriptyline (ELAVIL) 25 MG tablet Take 1 tablet (25 mg total) by mouth at bedtime. 30 tablet 6  . bisoprolol (ZEBETA) 5 MG tablet TAKE 1 TAB BY MOUTH ONCE DAILY. NEEDS BLOOD PRESSURE CHECK UP FOR ADDITIONAL REFILLS 15 tablet 0  . desogestrel-ethinyl estradiol (APRI,EMOQUETTE,SOLIA) 0.15-30 MG-MCG tablet Take 1 tablet by mouth daily.    . hydrochlorothiazide (HYDRODIURIL) 25 MG tablet TAKE 1 TABLET (25 MG  TOTAL) BY MOUTH DAILY. 15 tablet 0  . ibuprofen (ADVIL,MOTRIN) 600 MG tablet Take 1 tablet (600 mg total) by mouth every 8 (eight) hours as needed. 30 tablet 2  . meclizine (ANTIVERT) 50 MG tablet Take 0.5 tablets (25 mg total) by mouth 3 (three) times daily as needed. 30 tablet 0  . ondansetron (ZOFRAN) 8 MG tablet Take 1 tablet (8 mg total) by mouth every 8 (eight) hours as needed for nausea or vomiting. 20 tablet 0  . rizatriptan (MAXALT-MLT) 10 MG disintegrating tablet Take 1 tablet (10 mg total) by mouth as needed for migraine. May repeat in 2 hours if needed 9 tablet 11  . topiramate (TOPAMAX) 50 MG tablet Take 1 tablet (50 mg total) by mouth 2 (two) times daily. 180 tablet 3   No current facility-administered medications on file prior to visit.      Review of Systems:  As per HPI- otherwise negative. No fever or chills   Physical Examination: Vitals:   06/10/17 1543  BP: 136/70  Pulse: 86  Resp: 16  SpO2: 98%   Vitals:   06/10/17 1543  Weight: 194 lb (88 kg)  Height: _0  (1.651 m)   Body mass index is 32.28 kg/m. Ideal Body Weight: Weight in (lb) to have BMI = 25: 149.9  GEN: WDWN, NAD, Non-toxic, A & O x 3, overweight, looks well  HEENT: Atraumatic, Normocephalic. Neck supple. No masses, No LAD.  Bilateral TM wnl, oropharynx normal.  PEERL,EOMI.   Ears and Nose: No external deformity. CV: RRR, No M/G/R. No JVD. No thrill. No extra heart sounds. PULM: CTA B, no wheezes, crackles, rhonchi. No retractions. No resp. distress. No accessory muscle use. ABD: S, NT, ND EXTR: No c/c/e NEURO Normal gait.  PSYCH: Normally interactive. Conversant. Not depressed or anxious appearing.  Calm demeanor.  First degree burn on her left arm about the lateral elbow  EKG: SR, but she has some ST changes c/w 2016.  Discussed with DOD at cardiology who did not feel any urgent intervention was needed  Assessment and Plan: Palpitations - Plan: EKG 12-Lead, CBC, TSH, Holter monitor - 48 hour, ECHOCARDIOGRAM COMPLETE  History of gestational diabetes - Plan: Comprehensive metabolic panel, Hemoglobin A1c  Screening for hyperlipidemia - Plan: Lipid panel  Superficial burn of left elbow, initial encounter - Plan: silver sulfADIAZINE (SILVADENE) 1 % cream, Tdap vaccine greater than or equal to 7yo IM  Essential hypertension  Here today with palpitations, establishing care as well Tdap and silvadene for burn on her arm Labs pending as above BP under reasonable control Concern of palpitations for a couple of weeks, had also years ago Will set up an echo and holter for her She will seek care right away if any other concerns or symptoms   Signed Lamar Blinks, MD Received her labs 6/4 Results for orders placed or performed in  visit on 06/10/17  CBC  Result Value Ref Range   WBC 9.0 4.0 - 10.5 K/uL   RBC 5.15 (H) 3.87 - 5.11 Mil/uL   Platelets 316.0 150.0 - 400.0 K/uL   Hemoglobin 13.8 12.0 - 15.0 g/dL   HCT 40.4 36.0 - 46.0 %   MCV 78.4 78.0 - 100.0 fl   MCHC 34.1 30.0 - 36.0 g/dL   RDW 14.2 11.5 - 15.5 %  Comprehensive metabolic panel  Result Value Ref Range   Sodium 143 135 - 145 mEq/L   Potassium 3.4 (L) 3.5 - 5.1 mEq/L   Chloride 99 96 -  112 mEq/L   CO2 33 (H) 19 - 32 mEq/L   Glucose, Bld 94 70 - 99 mg/dL   BUN 9 6 - 23 mg/dL   Creatinine, Ser 0.65 0.40 - 1.20 mg/dL   Total Bilirubin 0.4 0.2 - 1.2 mg/dL   Alkaline Phosphatase 48 39 - 117 U/L   AST 16 0 - 37 U/L   ALT 20 0 - 35 U/L   Total Protein 6.7 6.0 - 8.3 g/dL   Albumin 4.0 3.5 - 5.2 g/dL   Calcium 9.8 8.4 - 10.5 mg/dL   GFR 107.58 >60.00 mL/min  Hemoglobin A1c  Result Value Ref Range   Hgb A1c MFr Bld 5.8 4.6 - 6.5 %  Lipid panel  Result Value Ref Range   Cholesterol 142 0 - 200 mg/dL   Triglycerides 163.0 (H) 0.0 - 149.0 mg/dL   HDL 37.20 (L) >39.00 mg/dL   VLDL 32.6 0.0 - 40.0 mg/dL   LDL Cholesterol 73 0 - 99 mg/dL   Total CHOL/HDL Ratio 4    NonHDL 105.18   TSH  Result Value Ref Range   TSH 2.06 0.35 - 4.50 uIU/mL   Message to pt  Blood counts are normal Metabolic profile is ok M2T is just barely in the pre-diabetes range.  You may have an increased risk of developing diabetes later on; weight loss and exercise will help prevent this! Cholesterol is overall ok, but your HDL (good cholesterol) is low.  I would suggest exercise and an omega 3 supplement if not taking already Thyroid is ok! Dexter

## 2017-06-10 ENCOUNTER — Encounter: Payer: Self-pay | Admitting: Family Medicine

## 2017-06-10 ENCOUNTER — Ambulatory Visit: Payer: BC Managed Care – PPO | Admitting: Family Medicine

## 2017-06-10 VITALS — BP 136/70 | HR 86 | Resp 16 | Ht 65.0 in | Wt 194.0 lb

## 2017-06-10 DIAGNOSIS — Z8632 Personal history of gestational diabetes: Secondary | ICD-10-CM | POA: Diagnosis not present

## 2017-06-10 DIAGNOSIS — T22122A Burn of first degree of left elbow, initial encounter: Secondary | ICD-10-CM

## 2017-06-10 DIAGNOSIS — I10 Essential (primary) hypertension: Secondary | ICD-10-CM

## 2017-06-10 DIAGNOSIS — Z23 Encounter for immunization: Secondary | ICD-10-CM

## 2017-06-10 DIAGNOSIS — Z13220 Encounter for screening for lipoid disorders: Secondary | ICD-10-CM | POA: Diagnosis not present

## 2017-06-10 DIAGNOSIS — R002 Palpitations: Secondary | ICD-10-CM

## 2017-06-10 MED ORDER — SILVER SULFADIAZINE 1 % EX CREA: 1.0000 "application " | TOPICAL_CREAM | Freq: Every day | CUTANEOUS | 0 refills | Status: DC

## 2017-06-10 NOTE — Patient Instructions (Addendum)
It was nice to see you again today!   Your BP is reasonable continue current medications and we will monitor this I will check routine labs for you today Tetanus booster today due to burn on your arm I gave you some silvadene cream to use on your left arm burn- apply a thin layer once a day as needed  We will arrange for a 24- 48 hour heart monitor and an echocardiogram Please let me know if any changes in your symptoms or any other concerns!

## 2017-06-11 ENCOUNTER — Encounter: Payer: Self-pay | Admitting: Family Medicine

## 2017-06-11 LAB — COMPREHENSIVE METABOLIC PANEL
ALT: 20 U/L (ref 0–35)
AST: 16 U/L (ref 0–37)
Albumin: 4 g/dL (ref 3.5–5.2)
Alkaline Phosphatase: 48 U/L (ref 39–117)
BUN: 9 mg/dL (ref 6–23)
CALCIUM: 9.8 mg/dL (ref 8.4–10.5)
CHLORIDE: 99 meq/L (ref 96–112)
CO2: 33 mEq/L — ABNORMAL HIGH (ref 19–32)
Creatinine, Ser: 0.65 mg/dL (ref 0.40–1.20)
GFR: 107.58 mL/min (ref >60.00)
Glucose, Bld: 94 mg/dL (ref 70–99)
Potassium: 3.4 mEq/L — ABNORMAL LOW (ref 3.5–5.1)
Sodium: 143 mEq/L (ref 135–145)
Total Bilirubin: 0.4 mg/dL (ref 0.2–1.2)
Total Protein: 6.7 g/dL (ref 6.0–8.3)

## 2017-06-11 LAB — CBC
HCT: 40.4 % (ref 36.0–46.0)
HEMOGLOBIN: 13.8 g/dL (ref 12.0–15.0)
MCHC: 34.1 g/dL (ref 30.0–36.0)
MCV: 78.4 fl (ref 78.0–100.0)
PLATELETS: 316 10*3/uL (ref 150.0–400.0)
RBC: 5.15 Mil/uL — ABNORMAL HIGH (ref 3.87–5.11)
RDW: 14.2 % (ref 11.5–15.5)
WBC: 9 10*3/uL (ref 4.0–10.5)

## 2017-06-11 LAB — LIPID PANEL
CHOLESTEROL: 142 mg/dL (ref 0–200)
HDL: 37.2 mg/dL — ABNORMAL LOW (ref >39.00)
LDL CALC: 73 mg/dL (ref 0–99)
NonHDL: 105.18
TRIGLYCERIDES: 163 mg/dL — AB (ref 0.0–149.0)
Total CHOL/HDL Ratio: 4
VLDL: 32.6 mg/dL (ref 0.0–40.0)

## 2017-06-11 LAB — HEMOGLOBIN A1C: Hgb A1c MFr Bld: 5.8 % (ref 4.6–6.5)

## 2017-06-11 LAB — TSH: TSH: 2.06 u[IU]/mL (ref 0.35–4.50)

## 2017-06-14 ENCOUNTER — Other Ambulatory Visit: Payer: Self-pay

## 2017-06-14 ENCOUNTER — Ambulatory Visit (HOSPITAL_COMMUNITY): Payer: BC Managed Care – PPO | Attending: Internal Medicine

## 2017-06-14 DIAGNOSIS — R002 Palpitations: Secondary | ICD-10-CM | POA: Insufficient documentation

## 2017-06-15 ENCOUNTER — Encounter: Payer: Self-pay | Admitting: Family Medicine

## 2017-06-18 ENCOUNTER — Ambulatory Visit (INDEPENDENT_AMBULATORY_CARE_PROVIDER_SITE_OTHER): Payer: BC Managed Care – PPO

## 2017-06-18 DIAGNOSIS — R002 Palpitations: Secondary | ICD-10-CM

## 2017-06-25 ENCOUNTER — Other Ambulatory Visit: Payer: Self-pay | Admitting: Family Medicine

## 2017-06-29 ENCOUNTER — Encounter: Payer: Self-pay | Admitting: Family Medicine

## 2017-07-12 ENCOUNTER — Other Ambulatory Visit: Payer: Self-pay | Admitting: Diagnostic Neuroimaging

## 2017-09-30 ENCOUNTER — Encounter: Payer: Self-pay | Admitting: Diagnostic Neuroimaging

## 2017-09-30 ENCOUNTER — Ambulatory Visit: Payer: BC Managed Care – PPO | Admitting: Diagnostic Neuroimaging

## 2017-09-30 VITALS — BP 124/79 | HR 83 | Ht 65.0 in | Wt 196.8 lb

## 2017-09-30 DIAGNOSIS — G43109 Migraine with aura, not intractable, without status migrainosus: Secondary | ICD-10-CM | POA: Diagnosis not present

## 2017-09-30 DIAGNOSIS — R200 Anesthesia of skin: Secondary | ICD-10-CM | POA: Diagnosis not present

## 2017-09-30 MED ORDER — RIZATRIPTAN BENZOATE 10 MG PO TBDP
10.0000 mg | ORAL_TABLET | ORAL | 11 refills | Status: DC | PRN
Start: 2017-09-30 — End: 2018-10-06

## 2017-09-30 MED ORDER — AMITRIPTYLINE HCL 25 MG PO TABS: 25.0000 mg | ORAL_TABLET | Freq: Every day | ORAL | 4 refills | Status: DC

## 2017-09-30 MED ORDER — TOPIRAMATE 50 MG PO TABS: 50.0000 mg | ORAL_TABLET | Freq: Two times a day (BID) | ORAL | 3 refills | Status: DC

## 2017-09-30 NOTE — Progress Notes (Signed)
[facility]  PATIENT: Kylie Galloway DOB: 1975/02/15  REFERRING CLINICIAN: Guest HISTORY FROM: patient  REASON FOR VISIT: follow up   HISTORICAL  CHIEF COMPLAINT:  Chief Complaint  Patient presents with  . Follow-up  . Migraine    doing well.      HISTORY OF PRESENT ILLNESS:   UPDATE (09/30/17, VRP): Since last visit, doing well. Symptoms are mild. Severity is mild. No alleviating or aggravating factors. Tolerating meds. Avg 1 HA per month in general. Increasing numbness in hands and feet.  UPDATE (03/25/17, VRP): Since last visit, was doing well until 3-4 months ago; now increasing HA 10-12 per month. Tolerating meds. No alleviating or aggravating factors. Sleep is suboptimal (5-6 hours per night or less).    UPDATE 06/23/15: Since last visit, doing well. HA are reduced. Only needed triptan 3-4 times in last 6 months. Tolerating TPX.   UPDATE 12/16/14: Since last visit 5-8 migraines, likely weather related. No other issues. Overall doing well.  UPDATE 08/10/14: Since last visit, doing better. Only 3 days of migraine since last visit. Tolerating TPX. Rizatriptan also working well. Overall doing well. MRI brain was normal. Still no specific triggers. Incidentally patient developed small rash on right perioral region 1 week ago, which started out as "cold sore". Patient denies any burning, numbness or pain with this rash. Rash seems to be stabilized and no longer expanding.  PRIOR HPI (06/25/14): 42 year old right-handed female here for evaluation of headaches, dizziness, visual changes. Since 2010 patient has had intermittent episodes of headache, frontal, occipital, sometimes unilateral pressure and throbbing sensation with nausea, photophobia, proceeded by seeing spots and vision changes. She was having 1 or less episodes per month. Gradually these increased uptake 3-4 per month. In 2016 she has had 5 episodes. Recent episodes have changed to include severe dizzy,  spinning sensation, lasting much longer than previously. Prior episodes used the last 1-2 hours at a time. Now episodes can last 5 hours up to 2-3 days at a time. No prior diagnosis of migraine. Patient's daughter has some headaches but not officially diagnosed. Patient denies any specific triggering or aggravating factors. No food, caffeine, hormonal, sleep or stress triggers. Patient has tried Motrin, BC powder, Tylenol, Aleve, Excedrin migraine in the past for headache treatment, with mild relief. Patient has remote history of Bell's palsy on the right side 2003 with incomplete recovery. Patient has had intermittent bilateral hand numbness in the past.   REVIEW OF SYSTEMS: Full 14 system review of systems performed and negative except: fatigue.   ALLERGIES: No Known Allergies  HOME MEDICATIONS: Outpatient Medications Prior to Visit  Medication Sig Dispense Refill  . amitriptyline (ELAVIL) 25 MG tablet TAKE 1 TABLET BY MOUTH EVERY DAY AT BEDTIME 90 tablet 3  . bisoprolol (ZEBETA) 5 MG tablet TAKE 1 TAB BY MOUTH ONCE DAILY. NEEDS BLOOD PRESSURE CHECK UP FOR ADDITIONAL REFILLS 15 tablet 0  . desogestrel-ethinyl estradiol (APRI,EMOQUETTE,SOLIA) 0.15-30 MG-MCG tablet Take 1 tablet by mouth daily.    . hydrochlorothiazide (HYDRODIURIL) 25 MG tablet TAKE 1 TABLET (25 MG TOTAL) BY MOUTH DAILY. 15 tablet 0  . meclizine (ANTIVERT) 50 MG tablet Take 0.5 tablets (25 mg total) by mouth 3 (three) times daily as needed. 30 tablet 0  . ondansetron (ZOFRAN) 8 MG tablet Take 1 tablet (8 mg total) by mouth every 8 (eight) hours as needed for nausea or vomiting. 20 tablet 0  . rizatriptan (MAXALT-MLT) 10 MG disintegrating tablet Take 1 tablet (10 mg total) by  mouth as needed for migraine. May repeat in 2 hours if needed 9 tablet 11  . topiramate (TOPAMAX) 50 MG tablet Take 1 tablet (50 mg total) by mouth 2 (two) times daily. 180 tablet 3  . ibuprofen (ADVIL,MOTRIN) 600 MG tablet Take 1 tablet (600 mg total) by  mouth every 8 (eight) hours as needed. 30 tablet 2  . silver sulfADIAZINE (SILVADENE) 1 % cream Apply 1 application topically daily. 50 g 0   No facility-administered medications prior to visit.     PAST MEDICAL HISTORY: Past Medical History:  Diagnosis Date  . Allergy   . Anxiety   . H/O Bell's palsy    rt sided  . Hypertension   . Migraine aura, persistent     PAST SURGICAL HISTORY: Past Surgical History:  Procedure Laterality Date  . CESAREAN SECTION  2010    FAMILY HISTORY: Family History  Problem Relation Age of Onset  . CAD Paternal Grandfather 70  . CAD Paternal Grandmother 84  . Diabetes Maternal Grandmother   . Diabetes Maternal Grandfather   . Hypertension Brother     SOCIAL HISTORY:  Social History   Socioeconomic History  . Marital status: Married    Spouse name: Gerald Stabs  . Number of children: 3  . Years of education: 31  . Highest education level: Not on file  Occupational History  . Not on file  Social Needs  . Financial resource strain: Not on file  . Food insecurity:    Worry: Not on file    Inability: Not on file  . Transportation needs:    Medical: Not on file    Non-medical: Not on file  Tobacco Use  . Smoking status: Never Smoker  . Smokeless tobacco: Never Used  Substance and Sexual Activity  . Alcohol use: No  . Drug use: No  . Sexual activity: Yes    Comment: married  Lifestyle  . Physical activity:    Days per week: Not on file    Minutes per session: Not on file  . Stress: Not on file  Relationships  . Social connections:    Talks on phone: Not on file    Gets together: Not on file    Attends religious service: Not on file    Active member of club or organization: Not on file    Attends meetings of clubs or organizations: Not on file    Relationship status: Not on file  . Intimate partner violence:    Fear of current or ex partner: Not on file    Emotionally abused: Not on file    Physically abused: Not on file     Forced sexual activity: Not on file  Other Topics Concern  . Not on file  Social History Narrative   Works as a Geophysicist/field seismologist.  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       PHYSICAL EXAM  GENERAL EXAM/CONSTITUTIONAL: Vitals:  Vitals:   09/30/17 1606  BP: 124/79  Pulse: 83  Weight: 196 lb 12.8 oz (89.3 kg)  Height: 5' 5"  (1.651 m)   Wt Readings from Last 10 Encounters:  09/30/17 196 lb 12.8 oz (89.3 kg)  06/10/17 194 lb (88 kg)  03/25/17 189 lb 6.4 oz (85.9 kg)  06/23/15 184 lb 3.2 oz (83.6 kg)  12/16/14 185 lb 9.6 oz (84.2 kg)  11/28/14 184 lb (83.5 kg)  08/10/14 188 lb 3.2 oz (85.4 kg)  07/13/14 191 lb (86.6 kg)  06/25/14 191 lb 3.2  oz (86.7 kg)  05/31/14 189 lb (85.7 kg)   Body mass index is 32.75 kg/m. No exam data present  Patient is in no distress; well developed, nourished and groomed; neck is supple   CARDIOVASCULAR:  Examination of carotid arteries is normal; no carotid bruits  Regular rate and rhythm, no murmurs  Examination of peripheral vascular system by observation and palpation is normal  EYES:  Ophthalmoscopic exam of optic discs and posterior segments is normal; no papilledema or hemorrhages  MUSCULOSKELETAL:  Gait, strength, tone, movements noted in Neurologic exam below  NEUROLOGIC: MENTAL STATUS:  No flowsheet data found.  awake, alert, oriented to person, place and time  recent and remote memory intact  normal attention and concentration  language fluent, comprehension intact, naming intact,   fund of knowledge appropriate  CRANIAL NERVE:   2nd - no papilledema on fundoscopic exam  2nd, 3rd, 4th, 6th - pupils equal and reactive to light, visual fields full to confrontation, extraocular muscles intact, no nystagmus  5th - facial sensation symmetric  7th - SYNKINESIS ON RIGHT FACE  8th - hearing intact  9th - palate elevates symmetrically, uvula midline  11th - shoulder shrug symmetric  12th - tongue  protrusion midline  MOTOR:   normal bulk and tone, full strength in the BUE, BLE  MILD ATROPHY OF L > R APB  MILD WEAKNESS OF LEFT APB  SENSORY:   normal and symmetric to light touch, temperature, vibration  COORDINATION:   finger-nose-finger, fine finger movements normal  REFLEXES:   deep tendon reflexes TRACE and symmetric  GAIT/STATION:   narrow based gait     DIAGNOSTIC DATA (LABS, IMAGING, TESTING) - I reviewed patient records, labs, notes, testing and imaging myself where available.  Lab Results  Component Value Date   WBC 9.0 06/10/2017   HGB 13.8 06/10/2017   HCT 40.4 06/10/2017   MCV 78.4 06/10/2017   PLT 316.0 06/10/2017      Component Value Date/Time   NA 143 06/10/2017 1645   K 3.4 (L) 06/10/2017 1645   CL 99 06/10/2017 1645   CO2 33 (H) 06/10/2017 1645   GLUCOSE 94 06/10/2017 1645   BUN 9 06/10/2017 1645   CREATININE 0.65 06/10/2017 1645   CREATININE 0.61 04/26/2014 1414   CALCIUM 9.8 06/10/2017 1645   PROT 6.7 06/10/2017 1645   ALBUMIN 4.0 06/10/2017 1645   AST 16 06/10/2017 1645   ALT 20 06/10/2017 1645   ALKPHOS 48 06/10/2017 1645   BILITOT 0.4 06/10/2017 1645   GFRNONAA >60 03/10/2008 1417   GFRAA  03/10/2008 1417    >60        The eGFR has been calculated using the MDRD equation. This calculation has not been validated in all clinical situations. eGFR's persistently <60 mL/min signify possible Chronic Kidney Disease.   Lab Results  Component Value Date   CHOL 142 06/10/2017   HDL 37.20 (L) 06/10/2017   LDLCALC 73 06/10/2017   TRIG 163.0 (H) 06/10/2017   CHOLHDL 4 06/10/2017   Lab Results  Component Value Date   HGBA1C 5.8 06/10/2017   No results found for: VITAMINB12 Lab Results  Component Value Date   TSH 2.06 06/10/2017    02/01/14 CT HEAD - normal [I reviewed images myself and agree with interpretation. -VRP]      ASSESSMENT AND PLAN  42 y.o. year old female here with migraine with aura since 2010;  worsening in the last 6 months without triggering factor. Also with significant weight  gain in 2016. Migraines are improved since starting topiramate and triptan. We'll continue therapy.   Dx:  No diagnosis found.    PLAN:  MIGRAINE WITH AURA  - stable; continue current meds: amitriptyline 69m at bedtime topiramate 530mtwice a day  rizatriptan as needed  HAND NUMBNESS (? Carpal tunnel syndrome; moderate symptoms) - wear wrist splints at bedtime - may consider EMG/NCS  FOOT NUMBNESS (mild intermittent) - ? Related to TPX side effect; monitor for now  Meds ordered this encounter  Medications  . topiramate (TOPAMAX) 50 MG tablet    Sig: Take 1 tablet (50 mg total) by mouth 2 (two) times daily.    Dispense:  180 tablet    Refill:  3  . rizatriptan (MAXALT-MLT) 10 MG disintegrating tablet    Sig: Take 1 tablet (10 mg total) by mouth as needed for migraine. May repeat in 2 hours if needed    Dispense:  9 tablet    Refill:  11  . amitriptyline (ELAVIL) 25 MG tablet    Sig: Take 1 tablet (25 mg total) by mouth at bedtime.    Dispense:  90 tablet    Refill:  4   Return in about 1 year (around 10/01/2018).    VIPenni BombardMD 09/15/10/45804:9:98M Certified in Neurology, Neurophysiology and Neuroimaging  [facility] [address] [phone]

## 2017-10-05 NOTE — Progress Notes (Signed)
[facility] [address], [address] [place], [place] [address] [phone]  Date:  10/09/2017   Name:  Kylie Galloway   DOB:  02/09/1975   MRN:  [id number]  PCP:  Pearline Cables, MD    Chief Complaint: New Patient (Initial Visit) (establish care, pt at [facility]) and Medication Refill (bp med, checking bp at home)   History of Present Illness:  Kylie Galloway is a 42 y.o. very pleasant female patient who presents with the following:  Scheduled for a new patient visit but we did see her in June of this year: Here today with palpitations, establishing care as well Tdap and silvadene for burn on her arm Labs pending as above BP under reasonable control Concern of palpitations for a couple of weeks, had also years ago Will set up an echo and holter for her She will seek care right away if any other concerns or symptoms   Her echo and holter did look fine Her palpitations have not continued and she has no CV concerns at this time  No CP She does not get any CP or SOB with exercise  BP Readings from Last 3 Encounters:  10/09/17 122/72  09/30/17 124/79  06/10/17 136/70    Her home BP are ok as well  EXB:MWUX per her GYN Dr. Vincente Poli  Flu: done today  Trouble with ingrown toenail - chronically- worse on the left great toe. She has chronic issues with the nail growing under the skin and it can be painful.  This is better during the summer when she wears flip-flops but her work shoes are more bothersome. She is interested in seeing podiatry for possible partial nail fold removal  Also a cyst on her forehead - has been present for years but getting larger, is visible if she wears her hair up and she might like to have it removed.  It is not really painful   Patient Active Problem List   Diagnosis Date Noted  . Hypertension 01/24/2012    Past Medical History:  Diagnosis Date  . Allergy   . Anxiety   . H/O Bell's palsy    rt  sided  . Hypertension   . Migraine aura, persistent     Past Surgical History:  Procedure Laterality Date  . CESAREAN SECTION  2010    Social History   Tobacco Use  . Smoking status: Never Smoker  . Smokeless tobacco: Never Used  Substance Use Topics  . Alcohol use: No  . Drug use: No    Family History  Problem Relation Age of Onset  . CAD Paternal Grandfather 71  . CAD Paternal Grandmother 5  . Diabetes Maternal Grandmother   . Diabetes Maternal Grandfather   . Hypertension Brother     No Known Allergies  Medication list has been reviewed and updated.  Current Outpatient Medications on File Prior to Visit  Medication Sig Dispense Refill  . amitriptyline (ELAVIL) 25 MG tablet Take 1 tablet (25 mg total) by mouth at bedtime. 90 tablet 4  . bisoprolol (ZEBETA) 5 MG tablet TAKE 1 TAB BY MOUTH ONCE DAILY. NEEDS BLOOD PRESSURE CHECK UP FOR ADDITIONAL REFILLS 15 tablet 0  . desogestrel-ethinyl estradiol (APRI,EMOQUETTE,SOLIA) 0.15-30 MG-MCG tablet Take 1 tablet by mouth daily.    . hydrochlorothiazide (HYDRODIURIL) 25 MG tablet TAKE 1 TABLET (25 MG TOTAL) BY MOUTH DAILY. 15 tablet 0  . Omega-3 Fatty Acids (FISH OIL) 1000  MG CAPS Take by mouth.    . rizatriptan (MAXALT-MLT) 10 MG disintegrating tablet Take 1 tablet (10 mg total) by mouth as needed for migraine. May repeat in 2 hours if needed 9 tablet 11  . topiramate (TOPAMAX) 50 MG tablet Take 1 tablet (50 mg total) by mouth 2 (two) times daily. 180 tablet 3  . vitamin B-12 (CYANOCOBALAMIN) 1000 MCG tablet Take 1,000 mcg by mouth daily.    . vitamin C (ASCORBIC ACID) 500 MG tablet Take 500 mg by mouth daily.     No current facility-administered medications on file prior to visit.     Review of Systems:  As per HPI- otherwise negative. No fever or chills No CP or SOB    Physical Examination: Vitals:   10/09/17 1515  BP: 122/72  Pulse: 86  Resp: 16  Temp: 98.2 F (36.8 C)  SpO2: 99%   Vitals:   10/09/17  1515  Weight: 197 lb (89.4 kg)  Height: 5\' 5"  (1.651 m)   Body mass index is 32.78 kg/m. Ideal Body Weight: Weight in (lb) to have BMI = 25: 149.9  GEN: WDWN, NAD, Non-toxic, A & O x 3, obese, looks well  HEENT: Atraumatic, Normocephalic. Neck supple. No masses, No LAD.  Bilateral TM wnl, oropharynx normal.  PEERL,EOMI.   Ears and Nose: No external deformity. CV: RRR, No M/G/R. No JVD. No thrill. No extra heart sounds. PULM: CTA B, no wheezes, crackles, rhonchi. No retractions. No resp. distress. No accessory muscle use. EXTR: No c/c/e NEURO Normal gait.  PSYCH: Normally interactive. Conversant. Not depressed or anxious appearing.  Calm demeanor.  Left great toenail, medial border displays mild ingrown nail.  Does not appear infected at all but is tender to direct pressure Blueberry sized cyst on her midline scalp, just above the hairline. May have a clear or mucus type material inside   Assessment and Plan: Ingrown toenail of left foot - Plan: Ambulatory referral to Podiatry  Need for influenza vaccination - Plan: Flu Vaccine QUAD 6+ mos PF IM (Fluarix Quad PF)  Scalp cyst - Plan: Ambulatory referral to General Surgery  Essential hypertension - Plan: bisoprolol (ZEBETA) 5 MG tablet, hydrochlorothiazide (HYDRODIURIL) 25 MG tablet  BP is well controlled, continue current medications, refilled today Flu shot given Podiatry referral and general surgery referral for cyst on head  Labs are UTD Plan for CPE in June   Signed Abbe Amsterdam, MD

## 2017-10-09 ENCOUNTER — Encounter: Payer: Self-pay | Admitting: Family Medicine

## 2017-10-09 ENCOUNTER — Ambulatory Visit: Payer: BC Managed Care – PPO | Admitting: Family Medicine

## 2017-10-09 VITALS — BP 122/72 | HR 86 | Temp 98.2°F | Resp 16 | Ht 65.0 in | Wt 197.0 lb

## 2017-10-09 DIAGNOSIS — I10 Essential (primary) hypertension: Secondary | ICD-10-CM | POA: Diagnosis not present

## 2017-10-09 DIAGNOSIS — L729 Follicular cyst of the skin and subcutaneous tissue, unspecified: Secondary | ICD-10-CM

## 2017-10-09 DIAGNOSIS — L600 Ingrowing nail: Secondary | ICD-10-CM | POA: Diagnosis not present

## 2017-10-09 DIAGNOSIS — Z23 Encounter for immunization: Secondary | ICD-10-CM | POA: Diagnosis not present

## 2017-10-09 MED ORDER — BISOPROLOL FUMARATE 5 MG PO TABS: ORAL_TABLET | ORAL | 3 refills | Status: DC

## 2017-10-09 MED ORDER — HYDROCHLOROTHIAZIDE 25 MG PO TABS: 25.0000 mg | ORAL_TABLET | Freq: Every day | ORAL | 3 refills | Status: DC

## 2017-10-09 NOTE — Patient Instructions (Signed)
Please come and see me in June for a physical I did a referral for general surgery to look at the cyst on your scalp Also podiatry to take care of your left great toenail  Let's plan to visit in June for a physical!

## 2017-10-25 ENCOUNTER — Ambulatory Visit: Payer: BC Managed Care – PPO | Admitting: Podiatry

## 2017-10-25 DIAGNOSIS — L600 Ingrowing nail: Secondary | ICD-10-CM | POA: Diagnosis not present

## 2017-10-25 MED ORDER — CEPHALEXIN 500 MG PO CAPS: 500.0000 mg | ORAL_CAPSULE | Freq: Three times a day (TID) | ORAL | 0 refills | Status: DC

## 2017-10-25 MED ORDER — NEOMYCIN-POLYMYXIN-HC 3.5-10000-1 OT SOLN: OTIC | 0 refills | Status: DC

## 2017-10-25 NOTE — Patient Instructions (Signed)

## 2017-10-26 DIAGNOSIS — L600 Ingrowing nail: Secondary | ICD-10-CM | POA: Insufficient documentation

## 2017-10-26 NOTE — Progress Notes (Signed)
Subjective:   Patient ID: Kylie Galloway, female   DOB: 42 y.o.   MRN: [id number]   HPI 42 year old female presents the office today with concerns of chronic ingrown toenails to both of her big toes of the left side worse than right.  She states that both corners of both big toenails are painful.  She is tried trimming them without any significant improvement.  She states that she like to have the procedure done to get rid of the corners of the nails.  She has no other concerns today.  ROS: All other systems negative     Objective:  Physical Exam  General: AAO x3, NAD  Dermatological: Incurvation present to both medial lateral aspects of bilateral hallux toenails.  Localized edema to the nail there is no significant erythema there is no ascending cellulitis.  There is no fluctuation or crepitation or any malodor.  Vascular: Dorsalis Pedis artery and Posterior Tibial artery pedal pulses are 2/4 bilateral with immedate capillary fill time. There is no pain with calf compression, swelling, warmth, erythema.   Neruologic: Grossly intact via light touch bilateral.  Protective threshold with Semmes Wienstein monofilament intact to all pedal sites bilateral.   Musculoskeletal: Tenderness the medial lateral nail borders of bilateral hallux with no other areas of tenderness.  Muscular strength 5/5 in all groups tested bilateral.  Gait: Unassisted, Nonantalgic.     Assessment:   Chronic ingrown toenails bilateral hallux    Plan:  -Treatment options discussed including all alternatives, risks, and complications -Etiology of symptoms were discussed -At this time, the patient is requesting partial nail removal with chemical matricectomy to the symptomatic portion of the nail. Risks and complications were discussed with the patient for which they understand and written consent was obtained. Under sterile conditions a total of 3 mL of a mixture of 2% lidocaine plain and 0.5% Marcaine plain was  infiltrated in a hallux block fashion. Once anesthetized, the skin was prepped in sterile fashion. A tourniquet was then applied. Next the medial and lateral aspect of hallux nail border was then sharply excised making sure to remove the entire offending nail border. Once the nails were ensured to be removed area was debrided and the underlying skin was intact. There is no purulence identified in the procedure. Next phenol was then applied under standard conditions and copiously irrigated. Silvadene was applied. A dry sterile dressing was applied. After application of the dressing the tourniquet was removed and there is found to be an immediate capillary refill time to the digit. The patient tolerated the procedure well any complications. Post procedure instructions were discussed the patient for which he verbally understood. Follow-up in one week for nail check or sooner if any problems are to arise. Discussed signs/symptoms of infection and directed to call the office immediately should any occur or go directly to the emergency room. In the meantime, encouraged to call the office with any questions, concerns, changes symptoms. -Keflex  Vivi Barrack DPM

## 2017-11-04 ENCOUNTER — Ambulatory Visit (INDEPENDENT_AMBULATORY_CARE_PROVIDER_SITE_OTHER): Payer: BC Managed Care – PPO

## 2017-11-04 DIAGNOSIS — L600 Ingrowing nail: Secondary | ICD-10-CM

## 2017-11-04 NOTE — Patient Instructions (Signed)

## 2017-11-05 NOTE — Progress Notes (Signed)
Patient is here today for follow-up appointment, recent procedure performed ingrown toenail borders bilateral hallux nails, procedure performed 10/25/2017.  She states that the areas are slightly tender, but overall it feels better than prior to the procedure.  No erythema, no redness, small amount of serosanguineous drainage, but no other signs and symptoms of infection.  The areas appear to be healing over well.  Discussed signs and symptoms of infection.  Verbal and written instructions were given to the patient, she is to follow-up with any acute symptom changes.

## 2017-12-16 ENCOUNTER — Encounter: Payer: Self-pay | Admitting: Family Medicine

## 2017-12-16 ENCOUNTER — Other Ambulatory Visit: Payer: Self-pay | Admitting: Family Medicine

## 2017-12-16 DIAGNOSIS — I10 Essential (primary) hypertension: Secondary | ICD-10-CM

## 2017-12-16 MED ORDER — HYDROCHLOROTHIAZIDE 50 MG PO TABS: 50.0000 mg | ORAL_TABLET | Freq: Every day | ORAL | 3 refills | Status: DC

## 2018-03-27 ENCOUNTER — Telehealth: Payer: BC Managed Care – PPO | Admitting: Physician Assistant

## 2018-03-27 DIAGNOSIS — B9689 Other specified bacterial agents as the cause of diseases classified elsewhere: Secondary | ICD-10-CM

## 2018-03-27 DIAGNOSIS — J019 Acute sinusitis, unspecified: Secondary | ICD-10-CM

## 2018-03-27 MED ORDER — AMOXICILLIN-POT CLAVULANATE 875-125 MG PO TABS: 1.0000 | ORAL_TABLET | Freq: Two times a day (BID) | ORAL | 0 refills | Status: DC

## 2018-03-27 NOTE — Progress Notes (Signed)
I have spent 5 minutes in review of e-visit questionnaire, review and updating patient chart, medical decision making and response to patient.   Jissel Slavens Cody Electa Sterry, PA-C    

## 2018-03-27 NOTE — Progress Notes (Signed)
We are sorry that you are not feeling well.  Here is how we plan to help!  Based on what you have shared with me it looks like you have sinusitis.  Sinusitis is inflammation and infection in the sinus cavities of the head.  Based on your presentation I believe you most likely have Acute Bacterial Sinusitis.  This is an infection caused by bacteria and is treated with antibiotics. I have prescribed Augmentin 875mg/125mg one tablet twice daily with food, for 7 days. You may use an oral decongestant such as Mucinex D or if you have glaucoma or high blood pressure use plain Mucinex. Saline nasal spray help and can safely be used as often as needed for congestion.  If you develop worsening sinus pain, fever or notice severe headache and vision changes, or if symptoms are not better after completion of antibiotic, please schedule an appointment with a health care provider.    Sinus infections are not as easily transmitted as other respiratory infection, however we still recommend that you avoid close contact with loved ones, especially the very Plante and elderly.  Remember to wash your hands thoroughly throughout the day as this is the number one way to prevent the spread of infection!  Home Care:  Only take medications as instructed by your medical team.  Complete the entire course of an antibiotic.  Do not take these medications with alcohol.  A steam or ultrasonic humidifier can help congestion.  You can place a towel over your head and breathe in the steam from hot water coming from a faucet.  Avoid close contacts especially the very Impast[place] and the elderly.  Cover your mouth when you cough or sneeze.  Always remember to wash your hands.  Get Help Right Away If:  You develop worsening fever or sinus pain.  You develop a severe head ache or visual changes.  Your symptoms persist after you have completed your treatment plan.  Make sure you  Understand these instructions.  Will watch your  condition.  Will get help right away if you are not doing well or get worse.  Your e-visit answers were reviewed by a board certified advanced clinical practitioner to complete your personal care plan.  Depending on the condition, your plan could have included both over the counter or prescription medications.  If there is a problem please reply  once you have received a response from your provider.  Your safety is important to us.  If you have drug allergies check your prescription carefully.    You can use MyChart to ask questions about today's visit, request a non-urgent call back, or ask for a work or school excuse for 24 hours related to this e-Visit. If it has been greater than 24 hours you will need to follow up with your provider, or enter a new e-Visit to address those concerns.  You will get an e-mail in the next two days asking about your experience.  I hope that your e-visit has been valuable and will speed your recovery. Thank you for using e-visits.    

## 2018-05-15 ENCOUNTER — Encounter: Payer: Self-pay | Admitting: Family Medicine

## 2018-06-24 NOTE — Progress Notes (Addendum)
[facility] [address] [place], [place] [address] [phone]  Date:  06/25/2018   Name:  Kylie Galloway   DOB:  11-12-1975   MRN:  [id number]  PCP:  Darreld Mclean, MD    Chief Complaint: Annual Exam   History of Present Illness:  Kylie Galloway is a 43 y.o. very pleasant female patient who presents with the following:  [place] woman with history of hypertension, migraine here today for a physical exam Last seen by myself in October for an ingrown toenail She had an ED visit in March with sinusitis- this is now resolved   She is a neurology patient, Dr. Leta Baptist, for migraine headaches GYN care per Dr. Helane Rima Her parents died [place], in their 14s in a motor vehicle accident She is married to [place].  He just started training to become a Engineer, structural- he is now a Corporate treasurer.  This has her stressed out  Pap: per GYN Mammogram: she is due to start this year Labs: Due today- she is NOT fasting  Immunizations: Tetanus is up-to-date  Amitriptyline Bisoprolol 5 daily Oral contraceptive pills HCTZ 50 mg daily Maxalt as needed Topamax 50 twice daily Phentermine per Dr. 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 Ingrown toenail 10/26/2017  . Migraine 12/25/2015  . Hypertension 01/24/2012    Past Medical History:  Diagnosis Date  . Allergy   . Anxiety   . H/O Bell's palsy    rt sided  . Hypertension   . Migraine aura, persistent     Past Surgical History:  Procedure Laterality Date  . CESAREAN SECTION  2010    Social History   Tobacco Use  . Smoking status: Never Smoker  . Smokeless tobacco: Never Used  Substance Use Topics  . Alcohol use: No  . Drug use: No    Family History  Problem  Relation Age of Onset  . CAD Paternal Grandfather 23  . CAD Paternal Grandmother 58  . Diabetes Maternal Grandmother   . Diabetes Maternal Grandfather   . Hypertension Brother     No Known Allergies  Medication list has been reviewed and updated.  Current Outpatient Medications on File Prior to Visit  Medication Sig Dispense Refill  . amitriptyline (ELAVIL) 25 MG tablet Take 1 tablet (25 mg total) by mouth at bedtime. 90 tablet 4  . bisoprolol (ZEBETA) 5 MG tablet TAKE 1 TAB BY MOUTH ONCE DAILY 90 tablet 3  . desogestrel-ethinyl estradiol (APRI,EMOQUETTE,SOLIA) 0.15-30 MG-MCG tablet Take 1 tablet by mouth daily.    . hydrochlorothiazide (HYDRODIURIL) 50 MG tablet Take 1 tablet (50 mg total) by mouth daily. 90 tablet 3  . Omega-3 Fatty Acids (FISH OIL) 1000 MG CAPS Take by mouth.    . phentermine (ADIPEX-P) 37.5 MG tablet Take 37.5 mg by mouth daily.  3  . rizatriptan (MAXALT-MLT) 10 MG disintegrating tablet Take 1 tablet (10 mg total) by mouth as needed for migraine. May repeat in 2 hours if needed 9 tablet 11  . topiramate (TOPAMAX) 50 MG tablet Take 1 tablet (50 mg total) by mouth 2 (two) times daily. 180 tablet 3  . vitamin B-12 (CYANOCOBALAMIN) 1000  MCG tablet Take 1,000 mcg by mouth daily.    . vitamin C (ASCORBIC ACID) 500 MG tablet Take 500 mg by mouth daily.     No current facility-administered medications on file prior to visit.     Review of Systems:  As per HPI- otherwise negative.   Physical Examination: Vitals:   06/25/18 1458  BP: 122/80  Pulse: 84  Resp: 16  Temp: 98.1 F (36.7 C)  SpO2: 99%   Vitals:   06/25/18 1458  Weight: 205 lb (93 kg)  Height: 5\' 5"  (1.651 m)   Body mass index is 34.11 kg/m. Ideal Body Weight: Weight in (lb) to have BMI = 25: 149.9  GEN: WDWN, NAD, Non-toxic, A & O x 3, obese, looks well but is upset today  HEENT: Atraumatic, Normocephalic. Neck supple. No masses, No LAD. Ears and Nose: No external deformity. CV: RRR, No  M/G/R. No JVD. No thrill. No extra heart sounds. PULM: CTA B, no wheezes, crackles, rhonchi. No retractions. No resp. distress. No accessory muscle use. ABD: S, NT, ND. No rebound. No HSM. EXTR: No c/c/e NEURO Normal gait.  PSYCH: Normally interactive. Conversant. Not depressed or anxious appearing.  Calm demeanor.    Assessment and Plan:   ICD-10-CM   1. Physical exam  Z00.00   2. Essential hypertension  I10   3. Screening for hyperlipidemia  Z13.220 Lipid panel  4. Migraine without status migrainosus, not intractable, unspecified migraine type  G43.909 amitriptyline (ELAVIL) 50 MG tablet  5. Screening for deficiency anemia  Z13.0 CBC  6. Screening for diabetes mellitus  Z13.1 Comprehensive metabolic panel    Hemoglobin A1c  7. Screening for HIV (human immunodeficiency virus)  Z11.4 HIV Antibody (routine testing w rflx)  8. Stress at home  F43.9 amitriptyline (ELAVIL) 50 MG tablet   Physical exam today She is overall doing ok but is stressed as her husband is in police office training She is on amitriptyline and topamax for her headaches We decided to go up on her amitriptyline to 50 mg for depression- she will let me know if not helpful for her She will get a mammogram next year  Follow-up: No follow-ups on file.  Meds ordered this encounter  Medications  . amitriptyline (ELAVIL) 50 MG tablet    Sig: Take 1 tablet (50 mg total) by mouth at bedtime.    Dispense:  30 tablet    Refill:  9   Orders Placed This Encounter  Procedures  . CBC  . Comprehensive metabolic panel  . Hemoglobin A1c  . Lipid panel  . HIV Antibody (routine testing w rflx)     Outpatient Encounter Medications as of 06/25/2018  Medication Sig  . amitriptyline (ELAVIL) 50 MG tablet Take 1 tablet (50 mg total) by mouth at bedtime.  . bisoprolol (ZEBETA) 5 MG tablet TAKE 1 TAB BY MOUTH ONCE DAILY  . desogestrel-ethinyl estradiol (APRI,EMOQUETTE,SOLIA) 0.15-30 MG-MCG tablet Take 1 tablet by mouth daily.   . hydrochlorothiazide (HYDRODIURIL) 50 MG tablet Take 1 tablet (50 mg total) by mouth daily.  . Omega-3 Fatty Acids (FISH OIL) 1000 MG CAPS Take by mouth.  . phentermine (ADIPEX-P) 37.5 MG tablet Take 37.5 mg by mouth daily.  . rizatriptan (MAXALT-MLT) 10 MG disintegrating tablet Take 1 tablet (10 mg total) by mouth as needed for migraine. May repeat in 2 hours if needed  . topiramate (TOPAMAX) 50 MG tablet Take 1 tablet (50 mg total) by mouth 2 (two) times daily.  . vitamin B-12 (  CYANOCOBALAMIN) 1000 MCG tablet Take 1,000 mcg by mouth daily.  . vitamin C (ASCORBIC ACID) 500 MG tablet Take 500 mg by mouth daily.  . [DISCONTINUED] amitriptyline (ELAVIL) 25 MG tablet Take 1 tablet (25 mg total) by mouth at bedtime.  . [DISCONTINUED] amoxicillin-clavulanate (AUGMENTIN) 875-125 MG tablet Take 1 tablet by mouth 2 (two) times daily.  . [DISCONTINUED] hydrochlorothiazide (HYDRODIURIL) 50 MG tablet TAKE 1 TABLET BY MOUTH EVERY DAY   No facility-administered encounter medications on file as of 06/25/2018.      Signed Abbe AmsterdamJessica Copland, MD  Received her labs, message to pt  Results for orders placed or performed in visit on 06/25/18  CBC  Result Value Ref Range   WBC 8.4 4.0 - 10.5 K/uL   RBC 4.79 3.87 - 5.11 Mil/uL   Platelets 267.0 150.0 - 400.0 K/uL   Hemoglobin 12.9 12.0 - 15.0 g/dL   HCT 40.938.5 81.136.0 - 91.446.0 %   MCV 80.4 78.0 - 100.0 fl   MCHC 33.5 30.0 - 36.0 g/dL   RDW 78.213.7 95.611.5 - 21.315.5 %  Comprehensive metabolic panel  Result Value Ref Range   Sodium 140 135 - 145 mEq/L   Potassium 3.0 (L) 3.5 - 5.1 mEq/L   Chloride 103 96 - 112 mEq/L   CO2 25 19 - 32 mEq/L   Glucose, Bld 127 (H) 70 - 99 mg/dL   BUN 12 6 - 23 mg/dL   Creatinine, Ser 0.860.71 0.40 - 1.20 mg/dL   Total Bilirubin 0.3 0.2 - 1.2 mg/dL   Alkaline Phosphatase 45 39 - 117 U/L   AST 21 0 - 37 U/L   ALT 21 0 - 35 U/L   Total Protein 6.2 6.0 - 8.3 g/dL   Albumin 3.8 3.5 - 5.2 g/dL   Calcium 8.9 8.4 - 57.810.5 mg/dL   GFR 46.9690.93  >29.52>60.00 mL/min  Hemoglobin A1c  Result Value Ref Range   Hgb A1c MFr Bld 6.1 4.6 - 6.5 %  Lipid panel  Result Value Ref Range   Cholesterol 140 0 - 200 mg/dL   Triglycerides 841.3175.0 (H) 0.0 - 149.0 mg/dL   HDL 24.4035.10 (L) >10.27>39.00 mg/dL   VLDL 25.335.0 0.0 - 66.440.0 mg/dL   LDL Cholesterol 70 0 - 99 mg/dL   Total CHOL/HDL Ratio 4    NonHDL 105.34

## 2018-06-25 ENCOUNTER — Ambulatory Visit (INDEPENDENT_AMBULATORY_CARE_PROVIDER_SITE_OTHER): Payer: BC Managed Care – PPO | Admitting: Family Medicine

## 2018-06-25 ENCOUNTER — Other Ambulatory Visit: Payer: Self-pay

## 2018-06-25 ENCOUNTER — Encounter: Payer: Self-pay | Admitting: Family Medicine

## 2018-06-25 VITALS — BP 122/80 | HR 84 | Temp 98.1°F | Resp 16 | Ht 65.0 in | Wt 205.0 lb

## 2018-06-25 DIAGNOSIS — Z13220 Encounter for screening for lipoid disorders: Secondary | ICD-10-CM | POA: Diagnosis not present

## 2018-06-25 DIAGNOSIS — I10 Essential (primary) hypertension: Secondary | ICD-10-CM | POA: Diagnosis not present

## 2018-06-25 DIAGNOSIS — Z114 Encounter for screening for human immunodeficiency virus [HIV]: Secondary | ICD-10-CM

## 2018-06-25 DIAGNOSIS — E876 Hypokalemia: Secondary | ICD-10-CM

## 2018-06-25 DIAGNOSIS — Z130 Encounter for screening for diseases of the blood and blood-forming organs and certain disorders involving the immune mechanism: Secondary | ICD-10-CM | POA: Diagnosis not present

## 2018-06-25 DIAGNOSIS — Z131 Encounter for screening for diabetes mellitus: Secondary | ICD-10-CM | POA: Diagnosis not present

## 2018-06-25 DIAGNOSIS — G43909 Migraine, unspecified, not intractable, without status migrainosus: Secondary | ICD-10-CM

## 2018-06-25 DIAGNOSIS — Z0000 Encounter for general adult medical examination without abnormal findings: Secondary | ICD-10-CM | POA: Diagnosis not present

## 2018-06-25 DIAGNOSIS — F439 Reaction to severe stress, unspecified: Secondary | ICD-10-CM

## 2018-06-25 MED ORDER — AMITRIPTYLINE HCL 50 MG PO TABS: 50.0000 mg | ORAL_TABLET | Freq: Every day | ORAL | 9 refills | Status: DC

## 2018-06-25 NOTE — Patient Instructions (Addendum)
It was good to see you today.  I will be in touch with your labs asap Let's increase your amitriptyline to 50 mg at bedtime to try and help with anxiety and stress.   Let me know how this works for you- we can go up more if needed BP looks fine You should have your first mammogram this year  Health Maintenance, Female Adopting a healthy lifestyle and getting preventive care can go a long way to promote health and wellness. Talk with your health care provider about what schedule of regular examinations is right for you. This is a good chance for you to check in with your provider about disease prevention and staying healthy. In between checkups, there are plenty of things you can do on your own. Experts have done a lot of research about which lifestyle changes and preventive measures are most likely to keep you healthy. Ask your health care provider for more information. Weight and diet Eat a healthy diet  Be sure to include plenty of vegetables, fruits, low-fat dairy products, and lean protein.  Do not eat a lot of foods high in solid fats, added sugars, or salt.  Get regular exercise. This is one of the most important things you can do for your health. ? Most adults should exercise for at least 150 minutes each week. The exercise should increase your heart rate and make you sweat (moderate-intensity exercise). ? Most adults should also do strengthening exercises at least twice a week. This is in addition to the moderate-intensity exercise. Maintain a healthy weight  Body mass index (BMI) is a measurement that can be used to identify possible weight problems. It estimates body fat based on height and weight. Your health care provider can help determine your BMI and help you achieve or maintain a healthy weight.  For females 67 years of age and older: ? A BMI below 18.5 is considered underweight. ? A BMI of 18.5 to 24.9 is normal. ? A BMI of 25 to 29.9 is considered overweight. ? A BMI of  30 and above is considered obese. Watch levels of cholesterol and blood lipids  You should start having your blood tested for lipids and cholesterol at 41 years of age, then have this test every 5 years.  You may need to have your cholesterol levels checked more often if: ? Your lipid or cholesterol levels are high. ? You are older than 41 years of age. ? You are at high risk for heart disease. Cancer screening Lung Cancer  Lung cancer screening is recommended for adults 72-24 years old who are at high risk for lung cancer because of a history of smoking.  A yearly low-dose CT scan of the lungs is recommended for people who: ? Currently smoke. ? Have quit within the past 15 years. ? Have at least a 30-pack-year history of smoking. A pack year is smoking an average of one pack of cigarettes a day for 1 year.  Yearly screening should continue until it has been 15 years since you quit.  Yearly screening should stop if you develop a health problem that would prevent you from having lung cancer treatment. Breast Cancer  Practice breast self-awareness. This means understanding how your breasts normally appear and feel.  It also means doing regular breast self-exams. Let your health care provider know about any changes, no matter how small.  If you are in your 20s or 30s, you should have a clinical breast exam (CBE) by a health  care provider every 1-3 years as part of a regular health exam.  If you are 63 or older, have a CBE every year. Also consider having a breast X-ray (mammogram) every year.  If you have a family history of breast cancer, talk to your health care provider about genetic screening.  If you are at high risk for breast cancer, talk to your health care provider about having an MRI and a mammogram every year.  Breast cancer gene (BRCA) assessment is recommended for women who have family members with BRCA-related cancers. BRCA-related cancers  include: ? Breast. ? Ovarian. ? Tubal. ? Peritoneal cancers.  Results of the assessment will determine the need for genetic counseling and BRCA1 and BRCA2 testing. Cervical Cancer Your health care provider may recommend that you be screened regularly for cancer of the pelvic organs (ovaries, uterus, and vagina). This screening involves a pelvic examination, including checking for microscopic changes to the surface of your cervix (Pap test). You may be encouraged to have this screening done every 3 years, beginning at age 1.  For women ages 87-65, health care providers may recommend pelvic exams and Pap testing every 3 years, or they may recommend the Pap and pelvic exam, combined with testing for human papilloma virus (HPV), every 5 years. Some types of HPV increase your risk of cervical cancer. Testing for HPV may also be done on women of any age with unclear Pap test results.  Other health care providers may not recommend any screening for nonpregnant women who are considered low risk for pelvic cancer and who do not have symptoms. Ask your health care provider if a screening pelvic exam is right for you.  If you have had past treatment for cervical cancer or a condition that could lead to cancer, you need Pap tests and screening for cancer for at least 20 years after your treatment. If Pap tests have been discontinued, your risk factors (such as having a new sexual partner) need to be reassessed to determine if screening should resume. Some women have medical problems that increase the chance of getting cervical cancer. In these cases, your health care provider may recommend more frequent screening and Pap tests. Colorectal Cancer  This type of cancer can be detected and often prevented.  Routine colorectal cancer screening usually begins at 41 years of age and continues through 41 years of age.  Your health care provider may recommend screening at an earlier age if you have risk factors for  colon cancer.  Your health care provider may also recommend using home test kits to check for hidden blood in the stool.  A small camera at the end of a tube can be used to examine your colon directly (sigmoidoscopy or colonoscopy). This is done to check for the earliest forms of colorectal cancer.  Routine screening usually begins at age 64.  Direct examination of the colon should be repeated every 5-10 years through 41 years of age. However, you may need to be screened more often if early forms of precancerous polyps or small growths are found. Skin Cancer  Check your skin from head to toe regularly.  Tell your health care provider about any new moles or changes in moles, especially if there is a change in a mole's shape or color.  Also tell your health care provider if you have a mole that is larger than the size of a pencil eraser.  Always use sunscreen. Apply sunscreen liberally and repeatedly throughout the day.  Protect  yourself by wearing long sleeves, pants, a wide-brimmed hat, and sunglasses whenever you are outside. Heart disease, diabetes, and high blood pressure  High blood pressure causes heart disease and increases the risk of stroke. High blood pressure is more likely to develop in: ? People who have blood pressure in the high end of the normal range (130-139/85-89 mm Hg). ? People who are overweight or obese. ? People who are African American.  If you are 67-22 years of age, have your blood pressure checked every 3-5 years. If you are 66 years of age or older, have your blood pressure checked every year. You should have your blood pressure measured twice-once when you are at a hospital or clinic, and once when you are not at a hospital or clinic. Record the average of the two measurements. To check your blood pressure when you are not at a hospital or clinic, you can use: ? An automated blood pressure machine at a pharmacy. ? A home blood pressure monitor.  If you are  between 37 years and 26 years old, ask your health care provider if you should take aspirin to prevent strokes.  Have regular diabetes screenings. This involves taking a blood sample to check your fasting blood sugar level. ? If you are at a normal weight and have a low risk for diabetes, have this test once every three years after 41 years of age. ? If you are overweight and have a high risk for diabetes, consider being tested at a younger age or more often. Preventing infection Hepatitis B  If you have a higher risk for hepatitis B, you should be screened for this virus. You are considered at high risk for hepatitis B if: ? You were born in a country where hepatitis B is common. Ask your health care provider which countries are considered high risk. ? Your parents were born in a high-risk country, and you have not been immunized against hepatitis B (hepatitis B vaccine). ? You have HIV or AIDS. ? You use needles to inject street drugs. ? You live with someone who has hepatitis B. ? You have had sex with someone who has hepatitis B. ? You get hemodialysis treatment. ? You take certain medicines for conditions, including cancer, organ transplantation, and autoimmune conditions. Hepatitis C  Blood testing is recommended for: ? Everyone born from [birth year] through 1963. ? Anyone with known risk factors for hepatitis C. Sexually transmitted infections (STIs)  You should be screened for sexually transmitted infections (STIs) including gonorrhea and chlamydia if: ? You are sexually active and are younger than 41 years of age. ? You are older than 41 years of age and your health care provider tells you that you are at risk for this type of infection. ? Your sexual activity has changed since you were last screened and you are at an increased risk for chlamydia or gonorrhea. Ask your health care provider if you are at risk.  If you do not have HIV, but are at risk, it may be recommended that you take  a prescription medicine daily to prevent HIV infection. This is called pre-exposure prophylaxis (PrEP). You are considered at risk if: ? You are sexually active and do not regularly use condoms or know the HIV status of your partner(s). ? You take drugs by injection. ? You are sexually active with a partner who has HIV. Talk with your health care provider about whether you are at high risk of being infected with HIV. If  you choose to begin PrEP, you should first be tested for HIV. You should then be tested every 3 months for as long as you are taking PrEP. Pregnancy  If you are premenopausal and you may become pregnant, ask your health care provider about preconception counseling.  If you may become pregnant, take 400 to 800 micrograms (mcg) of folic acid every day.  If you want to prevent pregnancy, talk to your health care provider about birth control (contraception). Osteoporosis and menopause  Osteoporosis is a disease in which the bones lose minerals and strength with aging. This can result in serious bone fractures. Your risk for osteoporosis can be identified using a bone density scan.  If you are 65 years of age or older, or if you are at risk for osteoporosis and fractures, ask your health care provider if you should be screened.  Ask your health care provider whether you should take a calcium or vitamin D supplement to lower your risk for osteoporosis.  Menopause may have certain physical symptoms and risks.  Hormone replacement therapy may reduce some of these symptoms and risks. Talk to your health care provider about whether hormone replacement therapy is right for you. Follow these instructions at home:  Schedule regular health, dental, and eye exams.  Stay current with your immunizations.  Do not use any tobacco products including cigarettes, chewing tobacco, or electronic cigarettes.  If you are pregnant, do not drink alcohol.  If you are breastfeeding, limit how  much and how often you drink alcohol.  Limit alcohol intake to no more than 1 drink per day for nonpregnant women. One drink equals 12 ounces of beer, 5 ounces of wine, or 1 ounces of hard liquor.  Do not use street drugs.  Do not share needles.  Ask your health care provider for help if you need support or information about quitting drugs.  Tell your health care provider if you often feel depressed.  Tell your health care provider if you have ever been abused or do not feel safe at home. This information is not intended to replace advice given to you by your health care provider. Make sure you discuss any questions you have with your health care provider. Document Released: 07/10/2010 Document Revised: 06/02/2015 Document Reviewed: 09/28/2014 Elsevier Interactive Patient Education  2019 [employer].

## 2018-06-26 ENCOUNTER — Encounter: Payer: BC Managed Care – PPO | Admitting: Family Medicine

## 2018-06-26 ENCOUNTER — Encounter: Payer: Self-pay | Admitting: Family Medicine

## 2018-06-26 LAB — COMPREHENSIVE METABOLIC PANEL
ALT: 21 U/L (ref 0–35)
AST: 21 U/L (ref 0–37)
Albumin: 3.8 g/dL (ref 3.5–5.2)
Alkaline Phosphatase: 45 U/L (ref 39–117)
BUN: 12 mg/dL (ref 6–23)
CO2: 25 mEq/L (ref 19–32)
Calcium: 8.9 mg/dL (ref 8.4–10.5)
Chloride: 103 mEq/L (ref 96–112)
Creatinine, Ser: 0.71 mg/dL (ref 0.40–1.20)
GFR: 90.93 mL/min (ref >60.00)
Glucose, Bld: 127 mg/dL — ABNORMAL HIGH (ref 70–99)
Potassium: 3 mEq/L — ABNORMAL LOW (ref 3.5–5.1)
Sodium: 140 mEq/L (ref 135–145)
Total Bilirubin: 0.3 mg/dL (ref 0.2–1.2)
Total Protein: 6.2 g/dL (ref 6.0–8.3)

## 2018-06-26 LAB — CBC
HCT: 38.5 % (ref 36.0–46.0)
Hemoglobin: 12.9 g/dL (ref 12.0–15.0)
MCHC: 33.5 g/dL (ref 30.0–36.0)
MCV: 80.4 fl (ref 78.0–100.0)
Platelets: 267 10*3/uL (ref 150.0–400.0)
RBC: 4.79 Mil/uL (ref 3.87–5.11)
RDW: 13.7 % (ref 11.5–15.5)
WBC: 8.4 10*3/uL (ref 4.0–10.5)

## 2018-06-26 LAB — LIPID PANEL
Cholesterol: 140 mg/dL (ref 0–200)
HDL: 35.1 mg/dL — ABNORMAL LOW (ref >39.00)
LDL Cholesterol: 70 mg/dL (ref 0–99)
NonHDL: 105.34
Total CHOL/HDL Ratio: 4
Triglycerides: 175 mg/dL — ABNORMAL HIGH (ref 0.0–149.0)
VLDL: 35 mg/dL (ref 0.0–40.0)

## 2018-06-26 LAB — HEMOGLOBIN A1C: Hgb A1c MFr Bld: 6.1 % (ref 4.6–6.5)

## 2018-06-26 MED ORDER — POTASSIUM CHLORIDE CRYS ER 10 MEQ PO TBCR: 10.0000 meq | EXTENDED_RELEASE_TABLET | Freq: Every day | ORAL | 3 refills | Status: DC

## 2018-06-26 NOTE — Addendum Note (Signed)
Addended by: Lamar Blinks C on: 06/26/2018 01:57 PM   Modules accepted: Orders

## 2018-06-27 LAB — HIV ANTIBODY (ROUTINE TESTING W REFLEX): HIV 1&2 Ab, 4th Generation: NONREACTIVE

## 2018-07-18 ENCOUNTER — Other Ambulatory Visit (INDEPENDENT_AMBULATORY_CARE_PROVIDER_SITE_OTHER): Payer: BC Managed Care – PPO

## 2018-07-18 ENCOUNTER — Other Ambulatory Visit: Payer: Self-pay | Admitting: Family Medicine

## 2018-07-18 ENCOUNTER — Other Ambulatory Visit: Payer: Self-pay

## 2018-07-18 DIAGNOSIS — F439 Reaction to severe stress, unspecified: Secondary | ICD-10-CM

## 2018-07-18 DIAGNOSIS — E876 Hypokalemia: Secondary | ICD-10-CM | POA: Diagnosis not present

## 2018-07-18 DIAGNOSIS — G43909 Migraine, unspecified, not intractable, without status migrainosus: Secondary | ICD-10-CM

## 2018-07-18 NOTE — Addendum Note (Signed)
Addended by: Kelle Darting A on: 07/18/2018 02:30 PM   Modules accepted: Orders

## 2018-07-19 ENCOUNTER — Encounter: Payer: Self-pay | Admitting: Family Medicine

## 2018-07-19 LAB — POTASSIUM: Potassium: 3.5 mmol/L (ref 3.5–5.3)

## 2018-09-11 ENCOUNTER — Other Ambulatory Visit: Payer: Self-pay | Admitting: Diagnostic Neuroimaging

## 2018-09-16 ENCOUNTER — Other Ambulatory Visit: Payer: Self-pay | Admitting: Family Medicine

## 2018-09-16 DIAGNOSIS — E876 Hypokalemia: Secondary | ICD-10-CM

## 2018-09-24 ENCOUNTER — Telehealth: Payer: Self-pay | Admitting: *Deleted

## 2018-09-24 NOTE — Telephone Encounter (Signed)
LVM requesting she call back or send my chart message to let us know if she would be agreeable to see Kylie Lomax, NP the week of Sept 28th.

## 2018-10-01 NOTE — Telephone Encounter (Signed)
Patient returned call and requested you call her back at [phone].

## 2018-10-01 NOTE — Telephone Encounter (Signed)
Called patient who stated she didn't mind seeing NP but needs afternoon appt. Due to her job. We rescheduled her with Dr Bernardo Heater because of lack of availability for afternoon appts with NP. She  verbalized understanding, appreciation.

## 2018-10-06 ENCOUNTER — Ambulatory Visit: Payer: BC Managed Care – PPO | Admitting: Diagnostic Neuroimaging

## 2018-10-06 ENCOUNTER — Encounter: Payer: Self-pay | Admitting: Diagnostic Neuroimaging

## 2018-10-06 ENCOUNTER — Other Ambulatory Visit: Payer: Self-pay

## 2018-10-06 VITALS — BP 124/91 | HR 89 | Temp 97.5°F | Ht 65.5 in | Wt 204.8 lb

## 2018-10-06 DIAGNOSIS — F439 Reaction to severe stress, unspecified: Secondary | ICD-10-CM | POA: Diagnosis not present

## 2018-10-06 DIAGNOSIS — G43909 Migraine, unspecified, not intractable, without status migrainosus: Secondary | ICD-10-CM

## 2018-10-06 DIAGNOSIS — R42 Dizziness and giddiness: Secondary | ICD-10-CM

## 2018-10-06 DIAGNOSIS — R200 Anesthesia of skin: Secondary | ICD-10-CM | POA: Diagnosis not present

## 2018-10-06 DIAGNOSIS — G43109 Migraine with aura, not intractable, without status migrainosus: Secondary | ICD-10-CM

## 2018-10-06 MED ORDER — AMITRIPTYLINE HCL 50 MG PO TABS: 50.0000 mg | ORAL_TABLET | Freq: Every day | ORAL | 4 refills | Status: DC

## 2018-10-06 MED ORDER — TOPIRAMATE 50 MG PO TABS: 50.0000 mg | ORAL_TABLET | Freq: Two times a day (BID) | ORAL | 4 refills | Status: DC

## 2018-10-06 MED ORDER — RIZATRIPTAN BENZOATE 10 MG PO TBDP: 10.0000 mg | ORAL_TABLET | ORAL | 11 refills | Status: DC | PRN

## 2018-10-06 NOTE — Patient Instructions (Signed)
MIGRAINE WITH AURA  - stable; continue: amitriptyline 50mg  at bedtime topiramate 50mg  twice a day  rizatriptan as needed  HAND NUMBNESS (rigth > left; possible Carpal tunnel syndrome; moderate symptoms) - check EMG/NCS

## 2018-10-06 NOTE — Progress Notes (Signed)
[facility]  PATIENT: Kylie Galloway DOB: [birth year]  REFERRING CLINICIAN: Guest HISTORY FROM: patient  REASON FOR VISIT: follow up   HISTORICAL  CHIEF COMPLAINT:  Chief Complaint  Patient presents with   Migraine    rm 7, one year FU, "I'm doing pretty well with migraines"    HISTORY OF PRESENT ILLNESS:   UPDATE (10/06/18, VRP): Since last visit, doing well. Symptoms are stable. Severity is mild. No alleviating or aggravating factors. Tolerating meds. Only 1-2 HA per month. Weather and allergies can aggravate HA.   UPDATE (09/30/17, VRP): Since last visit, doing well. Symptoms are mild. Severity is mild. No alleviating or aggravating factors. Tolerating meds. Avg 1 HA per month in general. Increasing numbness in hands and feet.  UPDATE (03/25/17, VRP): Since last visit, was doing well until 3-4 months ago; now increasing HA 10-12 per month. Tolerating meds. No alleviating or aggravating factors. Sleep is suboptimal (5-6 hours per night or less).    UPDATE 06/23/15: Since last visit, doing well. HA are reduced. Only needed triptan 3-4 times in last 6 months. Tolerating TPX.   UPDATE 12/16/14: Since last visit 5-8 migraines, likely weather related. No other issues. Overall doing well.  UPDATE 08/10/14: Since last visit, doing better. Only 3 days of migraine since last visit. Tolerating TPX. Rizatriptan also working well. Overall doing well. MRI brain was normal. Still no specific triggers. Incidentally patient developed small rash on right perioral region 1 week ago, which started out as "cold sore". Patient denies any burning, numbness or pain with this rash. Rash seems to be stabilized and no longer expanding.  PRIOR HPI (06/25/14): 43 year old right-handed female here for evaluation of headaches, dizziness, visual changes. Since 2010 patient has had intermittent episodes of headache, frontal, occipital, sometimes unilateral pressure and throbbing sensation with  nausea, photophobia, proceeded by seeing spots and vision changes. She was having 1 or less episodes per month. Gradually these increased uptake 3-4 per month. In 2016 she has had 5 episodes. Recent episodes have changed to include severe dizzy, spinning sensation, lasting much longer than previously. Prior episodes used the last 1-2 hours at a time. Now episodes can last 5 hours up to 2-3 days at a time. No prior diagnosis of migraine. Patient's daughter has some headaches but not officially diagnosed. Patient denies any specific triggering or aggravating factors. No food, caffeine, hormonal, sleep or stress triggers. Patient has tried Motrin, BC powder, Tylenol, Aleve, Excedrin migraine in the past for headache treatment, with mild relief. Patient has remote history of Bell's palsy on the right side 2003 with incomplete recovery. Patient has had intermittent bilateral hand numbness in the past.   REVIEW OF SYSTEMS: Full 14 system review of systems performed and negative except: as per HPI.    ALLERGIES: No Known Allergies  HOME MEDICATIONS: Outpatient Medications Prior to Visit  Medication Sig Dispense Refill   amitriptyline (ELAVIL) 50 MG tablet TAKE 1 TABLET BY MOUTH EVERYDAY AT BEDTIME 90 tablet 4   bisoprolol (ZEBETA) 5 MG tablet TAKE 1 TAB BY MOUTH ONCE DAILY 90 tablet 3   desogestrel-ethinyl estradiol (APRI,EMOQUETTE,SOLIA) 0.15-30 MG-MCG tablet Take 1 tablet by mouth daily.     hydrochlorothiazide (HYDRODIURIL) 50 MG tablet Take 1 tablet (50 mg total) by mouth daily. 90 tablet 3   KLOR-CON M10 10 MEQ tablet TAKE 1 TABLET BY MOUTH EVERY DAY 90 tablet 1   Omega-3 Fatty Acids (FISH OIL) 1000 MG CAPS Take by mouth.     phentermine (  ADIPEX-P) 37.5 MG tablet Take 37.5 mg by mouth daily.  3   rizatriptan (MAXALT-MLT) 10 MG disintegrating tablet Take 1 tablet (10 mg total) by mouth as needed for migraine. May repeat in 2 hours if needed 9 tablet 11   topiramate (TOPAMAX) 50 MG tablet  TAKE 1 TABLET BY MOUTH TWICE A DAY 180 tablet 3   vitamin B-12 (CYANOCOBALAMIN) 1000 MCG tablet Take 1,000 mcg by mouth daily.     vitamin C (ASCORBIC ACID) 500 MG tablet Take 500 mg by mouth daily.     No facility-administered medications prior to visit.     PAST MEDICAL HISTORY: Past Medical History:  Diagnosis Date   Allergy    Anxiety    H/O Bell's palsy    rt sided   Hypertension    Migraine aura, persistent     PAST SURGICAL HISTORY: Past Surgical History:  Procedure Laterality Date   CESAREAN SECTION  2010    FAMILY HISTORY: Family History  Problem Relation Age of Onset   CAD Paternal Grandfather 68   CAD Paternal Grandmother 6   Diabetes Maternal Grandmother    Diabetes Maternal Grandfather    Hypertension Brother     SOCIAL HISTORY:  Social History   Socioeconomic History   Marital status: Married    Spouse name: Gerald Stabs   Number of children: 3   Years of education: 12   Highest education level: Not on file  Occupational History   Not on file  Social Needs   Financial resource strain: Not on file   Food insecurity    Worry: Not on file    Inability: Not on file   Transportation needs    Medical: Not on file    Non-medical: Not on file  Tobacco Use   Smoking status: Never Smoker   Smokeless tobacco: Never Used  Substance and Sexual Activity   Alcohol use: No   Drug use: No   Sexual activity: Yes    Comment: married  Lifestyle   Physical activity    Days per week: Not on file    Minutes per session: Not on file   Stress: Not on file  Relationships   Social connections    Talks on phone: Not on file    Gets together: Not on file    Attends religious service: Not on file    Active member of club or organization: Not on file    Attends meetings of clubs or organizations: Not on file    Relationship status: Not on file   Intimate partner violence    Fear of current or ex partner: Not on file    Emotionally  abused: Not on file    Physically abused: Not on file    Forced sexual activity: Not on file  Other Topics Concern   Not on file  Social History Narrative   Works as a Geophysicist/field seismologist.  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       PHYSICAL EXAM  GENERAL EXAM/CONSTITUTIONAL: Vitals:  Vitals:   10/06/18 1550  BP: (!) 124/91  Pulse: 89  Temp: (!) 97.5 F (36.4 C)  Weight: 204 lb 12.8 oz (92.9 kg)  Height: 5' 5.5" (1.664 m)   Wt Readings from Last 10 Encounters:  10/06/18 204 lb 12.8 oz (92.9 kg)  06/25/18 205 lb (93 kg)  10/09/17 197 lb (89.4 kg)  09/30/17 196 lb 12.8 oz (89.3 kg)  06/10/17 194 lb (88 kg)  03/25/17 189 lb 6.4  oz (85.9 kg)  06/23/15 184 lb 3.2 oz (83.6 kg)  12/16/14 185 lb 9.6 oz (84.2 kg)  11/28/14 184 lb (83.5 kg)  08/10/14 188 lb 3.2 oz (85.4 kg)   Body mass index is 33.56 kg/m. No exam data present  Patient is in no distress; well developed, nourished and groomed; neck is supple   CARDIOVASCULAR:  Examination of carotid arteries is normal; no carotid bruits  Regular rate and rhythm, no murmurs  Examination of peripheral vascular system by observation and palpation is normal  EYES:  Ophthalmoscopic exam of optic discs and posterior segments is normal; no papilledema or hemorrhages  MUSCULOSKELETAL:  Gait, strength, tone, movements noted in Neurologic exam below  NEUROLOGIC: MENTAL STATUS:  No flowsheet data found.  awake, alert, oriented to person, place and time  recent and remote memory intact  normal attention and concentration  language fluent, comprehension intact, naming intact,   fund of knowledge appropriate  CRANIAL NERVE:   2nd - no papilledema on fundoscopic exam  2nd, 3rd, 4th, 6th - pupils equal and reactive to light, visual fields full to confrontation, extraocular muscles intact, no nystagmus  5th - facial sensation symmetric  7th - SYNKINESIS ON RIGHT FACE  8th - hearing intact  9th - palate  elevates symmetrically, uvula midline  11th - shoulder shrug symmetric  12th - tongue protrusion midline  MOTOR:   normal bulk and tone, full strength in the BUE, BLE  MILD ATROPHY OF L > R APB  MILD WEAKNESS OF LEFT APB  SENSORY:   normal and symmetric to light touch, temperature, vibration  COORDINATION:   finger-nose-finger, fine finger movements normal  REFLEXES:   deep tendon reflexes TRACE and symmetric  GAIT/STATION:   narrow based gait     DIAGNOSTIC DATA (LABS, IMAGING, TESTING) - I reviewed patient records, labs, notes, testing and imaging myself where available.  Lab Results  Component Value Date   WBC 8.4 06/25/2018   HGB 12.9 06/25/2018   HCT 38.5 06/25/2018   MCV 80.4 06/25/2018   PLT 267.0 06/25/2018      Component Value Date/Time   NA 140 06/25/2018 1527   K 3.5 07/18/2018 1431   CL 103 06/25/2018 1527   CO2 25 06/25/2018 1527   GLUCOSE 127 (H) 06/25/2018 1527   BUN 12 06/25/2018 1527   CREATININE 0.71 06/25/2018 1527   CREATININE 0.61 04/26/2014 1414   CALCIUM 8.9 06/25/2018 1527   PROT 6.2 06/25/2018 1527   ALBUMIN 3.8 06/25/2018 1527   AST 21 06/25/2018 1527   ALT 21 06/25/2018 1527   ALKPHOS 45 06/25/2018 1527   BILITOT 0.3 06/25/2018 1527   GFRNONAA >60 03/10/2008 1417   GFRAA  03/10/2008 1417    >60        The eGFR has been calculated using the MDRD equation. This calculation has not been validated in all clinical situations. eGFR's persistently <60 mL/min signify possible Chronic Kidney Disease.   Lab Results  Component Value Date   CHOL 140 06/25/2018   HDL 35.10 (L) 06/25/2018   LDLCALC 70 06/25/2018   TRIG 175.0 (H) 06/25/2018   CHOLHDL 4 06/25/2018   Lab Results  Component Value Date   HGBA1C 6.1 06/25/2018   No results found for: VITAMINB12 Lab Results  Component Value Date   TSH 2.06 06/10/2017    02/01/14 CT HEAD - normal [I reviewed images myself and agree with interpretation. -VRP]       ASSESSMENT AND PLAN  43 y.o.  year old female here with migraine with aura since 2010; worsening in the last 6 months without triggering factor. Also with significant weight gain in 2016. Migraines are improved since starting topiramate and triptan. We'll continue therapy.   Dx:  Bilateral hand numbness - Plan: NCV with EMG(electromyography)  Migraine without status migrainosus, not intractable, unspecified migraine type - Plan: amitriptyline (ELAVIL) 50 MG tablet  Stress at home - Plan: amitriptyline (ELAVIL) 50 MG tablet  Migraine with aura and without status migrainosus, not intractable  Numbness  Dizziness and giddiness    PLAN:  MIGRAINE WITH AURA  - stable; continue: amitriptyline 20m at bedtime topiramate 561mtwice a day  rizatriptan as needed  HAND NUMBNESS (rigth > left; possible Carpal tunnel syndrome; moderate symptoms) - check EMG/NCS  FOOT NUMBNESS (mild intermittent; improved) - ? Related to TPX side effect; monitor for now  Orders Placed This Encounter  Procedures   NCV with EMG(electromyography)   Meds ordered this encounter  Medications   topiramate (TOPAMAX) 50 MG tablet    Sig: Take 1 tablet (50 mg total) by mouth 2 (two) times daily.    Dispense:  180 tablet    Refill:  4   rizatriptan (MAXALT-MLT) 10 MG disintegrating tablet    Sig: Take 1 tablet (10 mg total) by mouth as needed for migraine. May repeat in 2 hours if needed    Dispense:  9 tablet    Refill:  11   amitriptyline (ELAVIL) 50 MG tablet    Sig: Take 1 tablet (50 mg total) by mouth at bedtime.    Dispense:  90 tablet    Refill:  4   Return in about 6 months (around 04/05/2019). return for EMG    VIPenni BombardMD 9/0/27/74123:8:78M Certified in Neurology, Neurophysiology and [facility] [id number] CircleSuLewis and Clark VillagerTierra [place]ita[place] [phone]

## 2018-10-15 ENCOUNTER — Other Ambulatory Visit: Payer: Self-pay | Admitting: Family Medicine

## 2018-10-15 DIAGNOSIS — I10 Essential (primary) hypertension: Secondary | ICD-10-CM

## 2018-10-31 ENCOUNTER — Other Ambulatory Visit: Payer: Self-pay

## 2018-10-31 DIAGNOSIS — Z20822 Contact with and (suspected) exposure to covid-19: Secondary | ICD-10-CM

## 2018-11-01 ENCOUNTER — Telehealth: Payer: Self-pay

## 2018-11-01 LAB — NOVEL CORONAVIRUS, NAA: SARS-CoV-2, NAA: NOT DETECTED

## 2018-11-01 NOTE — Telephone Encounter (Signed)
Daughter  Zenaya Ulatowski called requesting pt's covid results. Informed daughter that results are not back.

## 2018-11-06 ENCOUNTER — Ambulatory Visit (INDEPENDENT_AMBULATORY_CARE_PROVIDER_SITE_OTHER): Payer: BC Managed Care – PPO | Admitting: Diagnostic Neuroimaging

## 2018-11-06 ENCOUNTER — Other Ambulatory Visit: Payer: Self-pay

## 2018-11-06 DIAGNOSIS — R200 Anesthesia of skin: Secondary | ICD-10-CM | POA: Diagnosis not present

## 2018-11-06 DIAGNOSIS — Z0289 Encounter for other administrative examinations: Secondary | ICD-10-CM

## 2018-11-10 NOTE — Procedures (Signed)
[facility]  NCS (NERVE CONDUCTION STUDY) WITH EMG (ELECTROMYOGRAPHY) REPORT   STUDY DATE: 11/06/18 PATIENT NAME: Kylie Galloway DOB: 1975/08/13 MRN: [id number]  ORDERING CLINICIAN: Joycelyn Schmid, MD   TECHNOLOGIST: Durenda Age ELECTROMYOGRAPHER: Glenford Bayley. , MD  CLINICAL INFORMATION: 43 year old female with right > left hand numbness.  FINDINGS: NERVE CONDUCTION STUDY: Bilateral median motor responses are prolonged distal latencies (right 5.9 ms, left 5.2 ms, normal less than or equal to 4.4 ms), normal amplitudes, normal conduction velocities.  Right ulnar motor response is normal.  Bilateral median sensory responses are prolonged peak latencies and decreased amplitudes.  Right ulnar sensory response is normal.  Right ulnar F-wave latency is normal.   NEEDLE ELECTROMYOGRAPHY:  Needle examination of right upper extremity deltoid, biceps, triceps, flexor carpi radialis, first dorsal interosseous is normal.   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    INTERPRETING PHYSICIAN:  Suanne Marker, MD Certified in Neurology, Neurophysiology and Neuroimaging  [facility] [address] [place], [place] [address] [phone]   [facility]    Nerve / Sites Muscle Latency Ref. Amplitude Ref. Rel Amp Segments Distance Velocity Ref. Area    ms ms mV mV %  cm m/s m/s mVms  R Median - APB     Wrist APB 5.9 ?4.4 7.0 ?4.0 100 Wrist - APB 7   33.7     Upper arm APB 10.0  7.3  105 Upper arm - Wrist 21 52 ?49 38.1  L Median - APB     Wrist APB 5.2 ?4.4 7.7 ?4.0 100 Wrist - APB 7   40.1     Upper arm APB 8.3  8.7  112 Upper arm - Wrist 20 64 ?49 45.8  R Ulnar - ADM     Wrist ADM 2.5 ?3.3 10.7 ?6.0 100 Wrist - ADM 7   45.6     B.Elbow ADM 5.7  9.7  90.7 B.Elbow - Wrist 19 60 ?49 43.0     A.Elbow ADM 7.4  8.1  84.1 A.Elbow -  B.Elbow 10 58 ?49 37.7         A.Elbow - Wrist               SNC    Nerve / Sites Rec. Site Peak Lat Ref.  Amp Ref. Segments Distance    ms ms V V  cm  R Median - Orthodromic (Dig II, Mid palm)     Dig II Wrist 4.9 ?3.4 5 ?10 Dig II - Wrist 13  L Median - Orthodromic (Dig II, Mid palm)     Dig II Wrist 4.6 ?3.4 6 ?10 Dig II - Wrist 13  R Ulnar - Orthodromic, (Dig V, Mid palm)     Dig V Wrist 2.8 ?3.1 7 ?5 Dig V - Wrist 67           F  Wave    Nerve F Lat Ref.   ms ms  R Ulnar - ADM 26.9 ?32.0       EMG full       EMG Summary Table    Spontaneous MUAP Recruitment  Muscle IA Fib PSW Fasc Other Amp Dur. Poly Pattern  R. Deltoid Normal None None None _______ Normal Normal Normal Normal  R. Biceps brachii Normal None None None _______ Normal Normal Normal Normal  R. Triceps brachii Normal None None None _______ Normal  Normal Normal Normal  R. Flexor carpi radialis Normal None None None _______ Normal Normal Normal Normal  R. First dorsal interosseous Normal None None None _______ Normal Normal Normal Normal

## 2018-11-16 ENCOUNTER — Other Ambulatory Visit: Payer: Self-pay | Admitting: Diagnostic Neuroimaging

## 2018-12-31 ENCOUNTER — Emergency Department (HOSPITAL_COMMUNITY): Payer: BC Managed Care – PPO

## 2018-12-31 ENCOUNTER — Emergency Department (HOSPITAL_COMMUNITY)
Admission: EM | Admit: 2018-12-31 | Discharge: 2018-12-31 | Disposition: A | Discharge status: Home / Self Care | Payer: BC Managed Care – PPO | Attending: Emergency Medicine | Admitting: Emergency Medicine

## 2018-12-31 ENCOUNTER — Encounter (HOSPITAL_COMMUNITY): Payer: Self-pay | Admitting: Emergency Medicine

## 2018-12-31 ENCOUNTER — Encounter: Payer: Self-pay | Admitting: Family Medicine

## 2018-12-31 DIAGNOSIS — Z79899 Other long term (current) drug therapy: Secondary | ICD-10-CM | POA: Insufficient documentation

## 2018-12-31 DIAGNOSIS — R05 Cough: Secondary | ICD-10-CM | POA: Insufficient documentation

## 2018-12-31 DIAGNOSIS — R0789 Other chest pain: Secondary | ICD-10-CM | POA: Insufficient documentation

## 2018-12-31 DIAGNOSIS — I10 Essential (primary) hypertension: Secondary | ICD-10-CM | POA: Diagnosis not present

## 2018-12-31 DIAGNOSIS — R509 Fever, unspecified: Secondary | ICD-10-CM | POA: Diagnosis not present

## 2018-12-31 DIAGNOSIS — R0602 Shortness of breath: Secondary | ICD-10-CM | POA: Diagnosis present

## 2018-12-31 DIAGNOSIS — U071 COVID-19: Secondary | ICD-10-CM | POA: Insufficient documentation

## 2018-12-31 LAB — TROPONIN I (HIGH SENSITIVITY)
Troponin I (High Sensitivity): 3 ng/L (ref <18)
Troponin I (High Sensitivity): 3 ng/L (ref <18)

## 2018-12-31 LAB — BASIC METABOLIC PANEL
Anion gap: 9 (ref 5–15)
BUN: 5 mg/dL — ABNORMAL LOW (ref 6–20)
CO2: 22 mmol/L (ref 22–32)
Calcium: 8.1 mg/dL — ABNORMAL LOW (ref 8.9–10.3)
Chloride: 105 mmol/L (ref 98–111)
Creatinine, Ser: 0.77 mg/dL (ref 0.44–1.00)
GFR calc Af Amer: >60 mL/min (ref >60)
GFR calc non Af Amer: >60 mL/min (ref >60)
Glucose, Bld: 207 mg/dL — ABNORMAL HIGH (ref 70–99)
Potassium: 4 mmol/L (ref 3.5–5.1)
Sodium: 136 mmol/L (ref 135–145)

## 2018-12-31 LAB — CBC
HCT: 44.3 % (ref 36.0–46.0)
Hemoglobin: 14.3 g/dL (ref 12.0–15.0)
MCH: 26.8 pg (ref 26.0–34.0)
MCHC: 32.3 g/dL (ref 30.0–36.0)
MCV: 83 fL (ref 80.0–100.0)
Platelets: 172 10*3/uL (ref 150–400)
RBC: 5.34 MIL/uL — ABNORMAL HIGH (ref 3.87–5.11)
RDW: 14.1 % (ref 11.5–15.5)
WBC: 4.2 10*3/uL (ref 4.0–10.5)
nRBC: 0 % (ref 0.0–0.2)

## 2018-12-31 LAB — I-STAT BETA HCG BLOOD, ED (MC, WL, AP ONLY): I-stat hCG, quantitative: <5 m[IU]/mL (ref <5)

## 2018-12-31 LAB — PROCALCITONIN: Procalcitonin: 0.13 ng/mL

## 2018-12-31 LAB — D-DIMER, QUANTITATIVE: D-Dimer, Quant: 0.55 ug/mL-FEU — ABNORMAL HIGH (ref 0.00–0.50)

## 2018-12-31 LAB — LACTATE DEHYDROGENASE: LDH: 380 U/L — ABNORMAL HIGH (ref 98–192)

## 2018-12-31 LAB — LACTIC ACID, PLASMA
Lactic Acid, Venous: 2.5 mmol/L (ref 0.5–1.9)
Lactic Acid, Venous: 2.2 mmol/L (ref 0.5–1.9)

## 2018-12-31 LAB — TRIGLYCERIDES: Triglycerides: 158 mg/dL — ABNORMAL HIGH

## 2018-12-31 LAB — C-REACTIVE PROTEIN: CRP: 12.6 mg/dL — ABNORMAL HIGH

## 2018-12-31 LAB — FIBRINOGEN: Fibrinogen: 501 mg/dL — ABNORMAL HIGH (ref 210–475)

## 2018-12-31 LAB — FERRITIN: Ferritin: 869 ng/mL — ABNORMAL HIGH (ref 11–307)

## 2018-12-31 MED ORDER — ALBUTEROL SULFATE HFA 108 (90 BASE) MCG/ACT IN AERS
1.0000 | INHALATION_SPRAY | Freq: Once | RESPIRATORY_TRACT | Status: AC
  Administered 2018-12-31: 2 via RESPIRATORY_TRACT
  Filled 2018-12-31: qty 6.7

## 2018-12-31 MED ORDER — DEXAMETHASONE SODIUM PHOSPHATE 10 MG/ML IJ SOLN
10.0000 mg | Freq: Once | INTRAMUSCULAR | Status: AC
  Administered 2018-12-31: 10 mg via INTRAVENOUS
  Filled 2018-12-31: qty 1

## 2018-12-31 MED ORDER — SODIUM CHLORIDE 0.9% FLUSH
3.0000 mL | Freq: Once | INTRAVENOUS | Status: AC
  Administered 2018-12-31: 3 mL via INTRAVENOUS

## 2018-12-31 MED ORDER — ACETAMINOPHEN 325 MG PO TABS
650.0000 mg | ORAL_TABLET | Freq: Once | ORAL | Status: AC | PRN
  Administered 2018-12-31: 650 mg via ORAL
  Filled 2018-12-31 (×2): qty 2

## 2018-12-31 MED ORDER — SODIUM CHLORIDE 0.9 % IV SOLN: 1.0000 g | Freq: Once | INTRAVENOUS | Status: DC

## 2018-12-31 MED ORDER — SODIUM CHLORIDE 0.9 % IV SOLN: 500.0000 mg | Freq: Once | INTRAVENOUS | Status: DC

## 2018-12-31 MED ORDER — IOHEXOL 350 MG/ML SOLN
100.0000 mL | Freq: Once | INTRAVENOUS | Status: AC | PRN
  Administered 2018-12-31: 100 mL via INTRAVENOUS

## 2018-12-31 MED ORDER — SODIUM CHLORIDE 0.9 % IV BOLUS
1000.0000 mL | Freq: Once | INTRAVENOUS | Status: AC
  Administered 2018-12-31: 14:00:00 1000 mL via INTRAVENOUS

## 2018-12-31 NOTE — ED Notes (Signed)
Patient Alert and oriented to baseline. Stable and ambulatory to baseline. Patient verbalized understanding of the discharge instructions.  Patient belongings were taken by the patient.   

## 2018-12-31 NOTE — ED Triage Notes (Signed)
Pt reports positive covid test yesterday, states that she began having chest tightness and difficulty taking a deep breath today. Reports cough and fevers at home.

## 2018-12-31 NOTE — Discharge Instructions (Addendum)
Thank you for allowing us to care for you today.   Please return to the emergency department if you have any new or worsening symptoms.  Medications- You can take medications to help treat your symptoms: -Tylenol for fever and body aches. Please take as prescribed on the bottle. -Over the coutner cough medicine such as mucinex, robitussin, or other brands. -Flonase or saline nasal spray for nasal congestion -Vitamins as recommended by CDC  Treatment- This is a virus and unfortunately there are no antibitotics approved to treat this virus at this time. It is important to monitor your symptoms closely: -You should have a theremometer at home to check your temperature when feeling feverish. -Use a pulse ox meter to measure your oxygen when feeling short of breath.  -If your fever is over 100.4 despite taking tylenol or if your oxygen level drops below 94% these are reasons to rturn to the emergency department for further evaluation. Please call the emergency department before you come to make us aware.    We recommend you self-isolate for 10 days and to inform your work/family/friends that you has the virus.  They will need to self-quarantine for 14 days to monitor for symptoms.    Again: symptoms of shortness of breath, chest pain, difficulty breathing, new onset of confusion, any symptoms that are concerning. If any of these symptoms you should come to emergency department for evaluation.   I hope you feel better soon  

## 2018-12-31 NOTE — ED Provider Notes (Addendum)
[facility] EMERGENCY DEPARTMENT Provider Note   CSN: [id number] Arrival date & time: 12/31/18  1134     History Chief Complaint  Patient presents with  . covid  . Chest Pain  . Shortness of Breath    Kylie Galloway is a 43 y.o. female past medical history significant for allergies, Bell's palsy, hypertension, migraines presents to emergency room today with chief complaint of chest tightness and shortness of breath x2 days.Patient tested positive for Covid on 12/27/2018.  She describes the pain is located in the center of her chest.  She states pain is only present when she takes a deep breath or after coughing episodes.  She states the pain feels like a pressure sensation.  She rates the pain 4/10 in severity. Also endorsing dry cough as well as subjective fever and chills. She has been taking tylenol at home with minimal symptom relief. Denies dyspnea on exertion, SOB, chest tightness or pressure, radiation to left/right arm, jaw or back, nausea, or diaphoresis.   Of note patient's daughter tested positive for Covid in the beginning of December with complication of PE.  She herself denies personal  history of blood clots.  Past Medical History:  Diagnosis Date  . Allergy   . Anxiety   . H/O Bell's palsy    rt sided  . Hypertension   . Migraine aura, persistent     Patient Active Problem List   Diagnosis Date Noted  . Ingrown toenail 10/26/2017  . Migraine 12/25/2015  . Hypertension 01/24/2012    Past Surgical History:  Procedure Laterality Date  . CESAREAN SECTION  2010     OB History   No obstetric history on file.     Family History  Problem Relation Age of Onset  . CAD Paternal Grandfather 3  . CAD Paternal Grandmother 72  . Diabetes Maternal Grandmother   . Diabetes Maternal Grandfather   . Hypertension Brother     Social History   Tobacco Use  . Smoking status: Never Smoker  . Smokeless tobacco: Never Used  Substance Use Topics    . Alcohol use: No  . Drug use: No    Home Medications Prior to Admission medications   Medication Sig Start Date End Date Taking? Authorizing Provider  acetaminophen (TYLENOL) 500 MG tablet Take 1,000 mg by mouth every 6 (six) hours as needed for mild pain or fever.   Yes [provider]  amitriptyline (ELAVIL) 50 MG tablet Take 1 tablet (50 mg total) by mouth at bedtime. 10/06/18  Yes Penumalli, Earlean Polka, MD  amoxicillin (AMOXIL) 875 MG tablet Take 875 mg by mouth 2 (two) times daily. 12/27/18  Yes [provider]  Apple Cider Vinegar 500 MG TABS Take 500 mg by mouth daily.   Yes [provider]  bisoprolol (ZEBETA) 5 MG tablet TAKE 1 TABLET BY MOUTH EVERY DAY Patient taking differently: Take 5 mg by mouth daily. TAKE 1 TABLET BY MOUTH EVERY DAY 10/15/18  Yes Copland, Gay Filler, MD  BLISOVI 24 FE 1-20 MG-MCG(24) tablet Take 1 tablet by mouth daily. 10/06/18  Yes [provider]  cholecalciferol (VITAMIN D3) 25 MCG (1000 UT) tablet Take 1,000 Units by mouth daily.   Yes [provider]  fluticasone (FLONASE) 50 MCG/ACT nasal spray Place 2 sprays into both nostrils daily as needed for congestion. 12/27/18  Yes [provider]  hydrochlorothiazide (HYDRODIURIL) 50 MG tablet Take 1 tablet (50 mg total) by mouth daily. 12/16/17  Yes  Copland, Gwenlyn FoundJessica C, MD  ibuprofen (ADVIL) 200 MG tablet Take 400 mg by mouth every 6 (six) hours as needed for moderate pain (fever).   Yes [provider]  KLOR-CON M10 10 MEQ tablet TAKE 1 TABLET BY MOUTH EVERY DAY Patient taking differently: Take 10 mEq by mouth daily.  09/17/18  Yes Copland, Gwenlyn FoundJessica C, MD  Omega-3 Fatty Acids (FISH OIL) 1000 MG CAPS Take 1,000 mg by mouth daily.    Yes [provider]  phentermine (ADIPEX-P) 37.5 MG tablet Take 37.5 mg by mouth daily. 10/14/17  Yes [provider]  rizatriptan (MAXALT-MLT) 10 MG disintegrating tablet Take 1 tablet (10 mg total) by mouth as  needed for migraine. May repeat in 2 hours if needed 10/06/18  Yes Penumalli, Glenford BayleyVikram R, MD  topiramate (TOPAMAX) 50 MG tablet Take 1 tablet (50 mg total) by mouth 2 (two) times daily. 10/06/18  Yes Penumalli, Glenford BayleyVikram R, MD  vitamin B-12 (CYANOCOBALAMIN) 1000 MCG tablet Take 1,000 mcg by mouth daily.   Yes [provider]  vitamin C (ASCORBIC ACID) 500 MG tablet Take 1,000 mg by mouth daily.    Yes [provider]    Allergies    Patient has no known allergies.  Review of Systems   Review of Systems All other systems are reviewed and are negative for acute change except as noted in the HPI.  Physical Exam Updated Vital Signs BP 130/79   Pulse (!) 105   Temp (!) 102 F (38.9 C) (Oral)   Resp 20   SpO2 99%   Physical Exam Vitals and nursing note reviewed.  Constitutional:      General: She is not in acute distress.    Appearance: She is not ill-appearing.  HENT:     Head: Normocephalic and atraumatic.     Right Ear: Tympanic membrane and external ear normal.     Left Ear: Tympanic membrane and external ear normal.     Nose: Nose normal.     Mouth/Throat:     Mouth: Mucous membranes are dry.     Pharynx: Oropharynx is clear.  Eyes:     General: No scleral icterus.       Right eye: No discharge.        Left eye: No discharge.     Extraocular Movements: Extraocular movements intact.     Conjunctiva/sclera: Conjunctivae normal.     Pupils: Pupils are equal, round, and reactive to light.  Neck:     Vascular: No JVD.  Cardiovascular:     Rate and Rhythm: Regular rhythm. Tachycardia present.     Pulses: Normal pulses.          Radial pulses are 2+ on the right side and 2+ on the left side.     Heart sounds: Normal heart sounds.  Pulmonary:     Comments: Lung sounds diminished in bilateral bases.  Symmetric chest rise. No wheezing, rales, or rhonchi.  SPO2 is 97% during my exam.  She is speaking in full sentences.  No accessory muscle use. Chest:     Chest  wall: No tenderness.  Abdominal:     Comments: Abdomen is soft, non-distended, and non-tender in all quadrants. No rigidity, no guarding. No peritoneal signs.  Musculoskeletal:        General: Normal range of motion.     Cervical back: Normal range of motion.  Skin:    General: Skin is warm and dry.     Capillary Refill: Capillary refill  takes less than 2 seconds.  Neurological:     Mental Status: She is oriented to person, place, and time.     GCS: GCS eye subscore is 4. GCS verbal subscore is 5. GCS motor subscore is 6.     Comments: Fluent speech, no facial droop.  Psychiatric:        Behavior: Behavior normal.       ED Results / Procedures / Treatments   Labs (all labs ordered are listed, but only abnormal results are displayed) Labs Reviewed  BASIC METABOLIC PANEL - Abnormal; Notable for the following components:      Result Value   Glucose, Bld 207 (*)    BUN 5 (*)    Calcium 8.1 (*)    All other components within normal limits  CBC - Abnormal; Notable for the following components:   RBC 5.34 (*)    All other components within normal limits  LACTIC ACID, PLASMA - Abnormal; Notable for the following components:   Lactic Acid, Venous 2.5 (*)    All other components within normal limits  LACTIC ACID, PLASMA - Abnormal; Notable for the following components:   Lactic Acid, Venous 2.2 (*)    All other components within normal limits  D-DIMER, QUANTITATIVE (NOT AT [facility]) - Abnormal; Notable for the following components:   D-Dimer, Quant 0.55 (*)    All other components within normal limits  LACTATE DEHYDROGENASE - Abnormal; Notable for the following components:   LDH 380 (*)    All other components within normal limits  FERRITIN - Abnormal; Notable for the following components:   Ferritin 869 (*)    All other components within normal limits  FIBRINOGEN - Abnormal; Notable for the following components:   Fibrinogen 501 (*)    All other components within normal limits    C-REACTIVE PROTEIN - Abnormal; Notable for the following components:   CRP 12.6 (*)    All other components within normal limits  TRIGLYCERIDES - Abnormal; Notable for the following components:   Triglycerides 158 (*)    All other components within normal limits  CULTURE, BLOOD (ROUTINE X 2)  CULTURE, BLOOD (ROUTINE X 2)  PROCALCITONIN  I-STAT BETA HCG BLOOD, ED ([facility], [facility], [facility] ONLY)  TROPONIN I (HIGH SENSITIVITY)  TROPONIN I (HIGH SENSITIVITY)    EKG EKG Interpretation  Date/Time:  Wednesday December 31 2018 11:39:19 EST Ventricular Rate:  114 PR Interval:  116 QRS Duration: 84 QT Interval:  352 QTC Calculation: 485 R Axis:   1 Text Interpretation: Sinus tachycardia Otherwise normal ECG Since last tracing rate faster Confirmed by Richardean Canal [id number]) on 12/31/2018 2:38:30 PM   Radiology CT Angio Chest PE W and/or Wo Contrast  Result Date: 12/31/2018 CLINICAL DATA:  Shortness of breath. COVID positive. EXAM: CT ANGIOGRAPHY CHEST WITH CONTRAST TECHNIQUE: Multidetector CT imaging of the chest was performed using the standard protocol during bolus administration of intravenous contrast. Multiplanar CT image reconstructions and MIPs were obtained to evaluate the vascular anatomy. CONTRAST:  OMNIPAQUE IOHEXOL 350 MG/ML SOLN COMPARISON:  One-view chest x-ray 12/31/2018 FINDINGS: Cardiovascular: The heart size is normal. The aortic arch and great vessels within normal limits. Pulmonary artery opacification is excellent. No focal filling defects are present to suggest pulmonary embolus. Pulmonary artery size is within normal limits. Mediastinum/Nodes: No significant mediastinal, hilar, or axillary adenopathy scratched at there is mild prominence of hilar lymphoid tissue bilaterally. Subcarinal lymph node is evident. No significant mediastinal or axillary adenopathy is present otherwise.  Thoracic inlet is within normal limits. The esophagus is normal. Lungs/Pleura: Bilateral patchy lower  lobe dependent airspace disease is present bilaterally. Less prominent patchy airspace disease is present the upper lobes bilaterally. Most prominent airspace disease is posteriorly in the right lower lobe. There is no pneumothorax or significant pleural effusion. Upper Abdomen: Diffuse fatty infiltration of the liver is noted. Musculoskeletal: The vertebral body heights are maintained. Calcified disc present at C5-6. Central canal is otherwise widely patent. Review of the MIP images confirms the above findings. IMPRESSION: 1. No pulmonary embolus. 2. Bilateral patchy airspace disease compatible with multifocal pneumonia. 3. Hilar and subcarinal lymphoid tissue is likely reactive. 4. Diffuse fatty infiltration of the liver. Electronically Signed   By: Marin Roberts [name]   On: 12/31/2018 17:37   DG Chest Portable 1 View  Result Date: 12/31/2018 CLINICAL DATA:  Chest pain and shortness of breath in a patient who tested positive for COVID-19 12/27/2018. EXAM: PORTABLE CHEST 1 VIEW COMPARISON:  PA and lateral chest 06/21/2008. FINDINGS: Patchy airspace disease in the left mid and lower lung zone and right lung base is identified. Lung volumes are somewhat low. Heart size is normal. No pneumothorax or pleural effusion. IMPRESSION: Patchy basilar airspace disease is more notable on the left and has an appearance worrisome for pneumonia. Electronically Signed   By: Drusilla Kanner [name]   On: 12/31/2018 12:29    Procedures Procedures (including critical care time)  Medications Ordered in ED Medications  sodium chloride flush (NS) 0.9 % injection 3 mL (3 mLs Intravenous Given 12/31/18 1815)  acetaminophen (TYLENOL) tablet 650 mg (650 mg Oral Given 12/31/18 1535)  sodium chloride 0.9 % bolus 1,000 mL (0 mLs Intravenous Stopped 12/31/18 1514)  iohexol (OMNIPAQUE) 350 MG/ML injection 100 mL (100 mLs Intravenous Contrast Given 12/31/18 1645)  dexamethasone (DECADRON) injection 10 mg (10 mg Intravenous  Given 12/31/18 1814)  albuterol (VENTOLIN HFA) 108 (90 Base) MCG/ACT inhaler 1-2 puff (2 puffs Inhalation Given 12/31/18 1815)    ED Course  I have reviewed the triage vital signs and the nursing notes.  Pertinent labs & imaging results that were available during my care of the patient were reviewed by me and considered in my medical decision making (see chart for details).    MDM Rules/Calculators/A&P                      Patient seen and examined.  On arrival she is noted to be febrile at 102.3 and tachycardic to 110.  She is normotensive.  No hypoxia.  On my exam she does not appear septic, she is in no respiratory distress.  Lung sounds are diminished in bilateral lower bases, no wheezing rales or rhonchi heard, SpO2 stayed above 97% during entire exam.  EKG ordered in triage, no signs of ischemia. Covid order set for possible admission initiated. Chest reviewed by me shows patchy basilar airspace disease, likely Covid pneumonia. Patient ambulated in the emergency department without respiratory distress, tachycardia, or hypoxia. SpO2 during ambulation >94% on room air. Labs elevated as suspected in setting of covid including d dimer of 0.55, fibrinogen 501, ferritin 869. CRP 12.6. elevated lactic acid 2.5, IVF given. Repeat lactic acid is trending down at 2.2 Otherwise labs without leukocytosis.  No severe electrolyte derangement, no renal insufficiency. Discussed with ED attending Dr. Silverio Lay who agrees with plan to CT chest to rule out PE given recent family history of PE.  Thanfully CTA is negative for PE. I rechecked  patient's vitals while discussing imaging results, fever is improving now 101, and heart rate ranging from 100-105. Will give decadron and albuterol inhaler for symptomatic management.  Engaged in shared decision making with patient requesting to be discharged home.  She feels comfortable that she can manage her symptoms at home and is requesting to be discharged. We discussed very  strict return precautions including worsening shortness of breath and chest pain, fever not controlled with tylenol, difficulty breathing, any other concerning symptoms.  The patient appears reasonably screened and/or stabilized for discharge and I doubt any other medical condition or other [facility] requiring further screening, evaluation, or treatment in the ED at this time prior to discharge. The patient is safe for discharge with strict return precautions discussed. Recommend pcp follow up. Findings and plan of care discussed with supervising physician Dr. Silverio Lay.   Kylie Galloway was evaluated in Emergency Department on 12/31/2018 for the symptoms described in the history of present illness. She was evaluated in the context of the global COVID-19 pandemic, which necessitated consideration that the patient might be at risk for infection with the SARS-CoV-2 virus that causes COVID-19. Institutional protocols and algorithms that pertain to the evaluation of patients at risk for COVID-19 are in a state of rapid change based on information released by regulatory bodies including the CDC and federal and state organizations. These policies and algorithms were followed during the patient's care in the ED.   Portions of this note were generated with Scientist, clinical (histocompatibility and immunogenetics). Dictation errors may occur despite best attempts at proofreading.   Final Clinical Impression(s) / ED Diagnoses Final diagnoses:  COVID-19 virus infection    Rx / DC Orders ED Discharge Orders    None       Sherene Sires, PA-C 12/31/18 1848    Sherene Sires, PA-C 12/31/18 2251    Charlynne Pander, MD 01/01/19 [phone]

## 2018-12-31 NOTE — ED Notes (Signed)
Pt ambulated self efficiently with no difficulty. Pt o2 sat @ 100% RA. During ambulation pt o2 sat @ 96% RA. Returning to bedside pt o2 sat @ 97% RA. Pt complains of no SHOB. Pt returned safely to bedside.

## 2019-01-02 ENCOUNTER — Emergency Department (HOSPITAL_COMMUNITY): Payer: BC Managed Care – PPO

## 2019-01-02 ENCOUNTER — Inpatient Hospital Stay (HOSPITAL_COMMUNITY)
Admission: EM | Admit: 2019-01-02 | Discharge: 2019-01-07 | DRG: 177 | Disposition: A | Discharge status: Home / Self Care | Payer: BC Managed Care – PPO | Attending: Family Medicine | Admitting: Family Medicine

## 2019-01-02 ENCOUNTER — Other Ambulatory Visit: Payer: Self-pay

## 2019-01-02 ENCOUNTER — Encounter: Payer: Self-pay | Admitting: Family Medicine

## 2019-01-02 DIAGNOSIS — G43909 Migraine, unspecified, not intractable, without status migrainosus: Secondary | ICD-10-CM | POA: Diagnosis present

## 2019-01-02 DIAGNOSIS — Z8249 Family history of ischemic heart disease and other diseases of the circulatory system: Secondary | ICD-10-CM | POA: Diagnosis not present

## 2019-01-02 DIAGNOSIS — J9601 Acute respiratory failure with hypoxia: Secondary | ICD-10-CM | POA: Diagnosis not present

## 2019-01-02 DIAGNOSIS — I10 Essential (primary) hypertension: Secondary | ICD-10-CM | POA: Diagnosis present

## 2019-01-02 DIAGNOSIS — Z79899 Other long term (current) drug therapy: Secondary | ICD-10-CM | POA: Diagnosis not present

## 2019-01-02 DIAGNOSIS — J9691 Respiratory failure, unspecified with hypoxia: Secondary | ICD-10-CM

## 2019-01-02 DIAGNOSIS — Z833 Family history of diabetes mellitus: Secondary | ICD-10-CM

## 2019-01-02 DIAGNOSIS — U071 COVID-19: Secondary | ICD-10-CM | POA: Diagnosis not present

## 2019-01-02 DIAGNOSIS — J1289 Other viral pneumonia: Secondary | ICD-10-CM | POA: Diagnosis not present

## 2019-01-02 DIAGNOSIS — Z791 Long term (current) use of non-steroidal anti-inflammatories (NSAID): Secondary | ICD-10-CM

## 2019-01-02 DIAGNOSIS — R0602 Shortness of breath: Secondary | ICD-10-CM | POA: Diagnosis not present

## 2019-01-02 DIAGNOSIS — R0902 Hypoxemia: Secondary | ICD-10-CM

## 2019-01-02 DIAGNOSIS — J1282 Pneumonia due to coronavirus disease 2019: Secondary | ICD-10-CM

## 2019-01-02 LAB — CBC WITH DIFFERENTIAL/PLATELET
Abs Immature Granulocytes: 0.06 10*3/uL (ref 0.00–0.07)
Basophils Absolute: 0 10*3/uL (ref 0.0–0.1)
Basophils Relative: 0 %
Eosinophils Absolute: 0 10*3/uL (ref 0.0–0.5)
Eosinophils Relative: 0 %
HCT: 39.7 % (ref 36.0–46.0)
Hemoglobin: 13.3 g/dL (ref 12.0–15.0)
Immature Granulocytes: 1 %
Lymphocytes Relative: 15 %
Lymphs Abs: 0.8 10*3/uL (ref 0.7–4.0)
MCH: 26.7 pg (ref 26.0–34.0)
MCHC: 33.5 g/dL (ref 30.0–36.0)
MCV: 79.6 fL — ABNORMAL LOW (ref 80.0–100.0)
Monocytes Absolute: 0.2 10*3/uL (ref 0.1–1.0)
Monocytes Relative: 3 %
Neutro Abs: 4 10*3/uL (ref 1.7–7.7)
Neutrophils Relative %: 81 %
Platelets: 175 10*3/uL (ref 150–400)
RBC: 4.99 MIL/uL (ref 3.87–5.11)
RDW: 13.7 % (ref 11.5–15.5)
WBC: 5 10*3/uL (ref 4.0–10.5)
nRBC: 0 % (ref 0.0–0.2)

## 2019-01-02 LAB — RESPIRATORY PANEL BY PCR

## 2019-01-02 LAB — COMPREHENSIVE METABOLIC PANEL
ALT: 94 U/L — ABNORMAL HIGH (ref 0–44)
AST: 104 U/L — ABNORMAL HIGH (ref 15–41)
Albumin: 2.9 g/dL — ABNORMAL LOW (ref 3.5–5.0)
Alkaline Phosphatase: 45 U/L (ref 38–126)
Anion gap: 9 (ref 5–15)
BUN: 6 mg/dL (ref 6–20)
CO2: 22 mmol/L (ref 22–32)
Calcium: 7.9 mg/dL — ABNORMAL LOW (ref 8.9–10.3)
Chloride: 106 mmol/L (ref 98–111)
Creatinine, Ser: 0.67 mg/dL (ref 0.44–1.00)
GFR calc Af Amer: >60 mL/min (ref >60)
GFR calc non Af Amer: >60 mL/min (ref >60)
Glucose, Bld: 110 mg/dL — ABNORMAL HIGH (ref 70–99)
Potassium: 3.6 mmol/L (ref 3.5–5.1)
Sodium: 137 mmol/L (ref 135–145)
Total Bilirubin: 0.4 mg/dL (ref 0.3–1.2)
Total Protein: 6.2 g/dL — ABNORMAL LOW (ref 6.5–8.1)

## 2019-01-02 LAB — FIBRINOGEN: Fibrinogen: 573 mg/dL — ABNORMAL HIGH (ref 210–475)

## 2019-01-02 LAB — C-REACTIVE PROTEIN: CRP: 8.9 mg/dL — ABNORMAL HIGH (ref <1.0)

## 2019-01-02 LAB — LACTATE DEHYDROGENASE: LDH: 384 U/L — ABNORMAL HIGH (ref 98–192)

## 2019-01-02 LAB — LACTIC ACID, PLASMA: Lactic Acid, Venous: 1.9 mmol/L (ref 0.5–1.9)

## 2019-01-02 LAB — FERRITIN: Ferritin: 998 ng/mL — ABNORMAL HIGH (ref 11–307)

## 2019-01-02 LAB — D-DIMER, QUANTITATIVE: D-Dimer, Quant: 0.47 ug/mL-FEU (ref 0.00–0.50)

## 2019-01-02 LAB — ABO/RH: ABO/RH(D): AB NEG

## 2019-01-02 LAB — TRIGLYCERIDES: Triglycerides: 233 mg/dL — ABNORMAL HIGH (ref <150)

## 2019-01-02 LAB — PROCALCITONIN: Procalcitonin: 0.54 ng/mL

## 2019-01-02 MED ORDER — SODIUM CHLORIDE 0.9 % IV SOLN
200.0000 mg | Freq: Once | INTRAVENOUS | Status: AC
  Administered 2019-01-02: 200 mg via INTRAVENOUS
  Filled 2019-01-02: qty 40

## 2019-01-02 MED ORDER — IBUPROFEN 800 MG PO TABS
800.0000 mg | ORAL_TABLET | Freq: Once | ORAL | Status: AC
  Administered 2019-01-02: 800 mg via ORAL
  Filled 2019-01-02: qty 1

## 2019-01-02 MED ORDER — TOPIRAMATE 25 MG PO TABS
50.0000 mg | ORAL_TABLET | Freq: Two times a day (BID) | ORAL | Status: DC
  Administered 2019-01-02 – 2019-01-07 (×10): 50 mg via ORAL
  Filled 2019-01-02 (×10): qty 2

## 2019-01-02 MED ORDER — HYDROCHLOROTHIAZIDE 25 MG PO TABS
50.0000 mg | ORAL_TABLET | Freq: Every day | ORAL | Status: DC
  Administered 2019-01-03: 50 mg via ORAL
  Filled 2019-01-02: qty 2

## 2019-01-02 MED ORDER — SODIUM CHLORIDE 0.9 % IV SOLN
100.0000 mg | Freq: Every day | INTRAVENOUS | Status: AC
  Administered 2019-01-03 – 2019-01-06 (×4): 100 mg via INTRAVENOUS
  Filled 2019-01-02 (×5): qty 20

## 2019-01-02 MED ORDER — BISOPROLOL FUMARATE 5 MG PO TABS
5.0000 mg | ORAL_TABLET | Freq: Every day | ORAL | Status: DC
  Administered 2019-01-03: 5 mg via ORAL
  Filled 2019-01-02 (×2): qty 1

## 2019-01-02 MED ORDER — ACETAMINOPHEN 325 MG PO TABS: 325.0000 mg | ORAL_TABLET | Freq: Once | ORAL | Status: DC

## 2019-01-02 MED ORDER — POTASSIUM CHLORIDE CRYS ER 10 MEQ PO TBCR
10.0000 meq | EXTENDED_RELEASE_TABLET | Freq: Every day | ORAL | Status: DC
  Administered 2019-01-02 – 2019-01-03 (×2): 10 meq via ORAL
  Filled 2019-01-02 (×2): qty 1

## 2019-01-02 MED ORDER — ACETAMINOPHEN 500 MG PO TABS
1000.0000 mg | ORAL_TABLET | Freq: Once | ORAL | Status: AC
  Administered 2019-01-02: 1000 mg via ORAL
  Filled 2019-01-02: qty 2

## 2019-01-02 MED ORDER — IBUPROFEN 400 MG PO TABS: 600.0000 mg | ORAL_TABLET | Freq: Four times a day (QID) | ORAL | Status: DC | PRN

## 2019-01-02 MED ORDER — ACETAMINOPHEN 325 MG PO TABS
650.0000 mg | ORAL_TABLET | Freq: Four times a day (QID) | ORAL | Status: DC | PRN
  Administered 2019-01-02 – 2019-01-05 (×5): 650 mg via ORAL
  Filled 2019-01-02 (×4): qty 2

## 2019-01-02 MED ORDER — ENOXAPARIN SODIUM 40 MG/0.4ML ~~LOC~~ SOLN
40.0000 mg | SUBCUTANEOUS | Status: DC
  Administered 2019-01-02 – 2019-01-06 (×5): 40 mg via SUBCUTANEOUS
  Filled 2019-01-02 (×6): qty 0.4

## 2019-01-02 MED ORDER — DEXAMETHASONE SODIUM PHOSPHATE 10 MG/ML IJ SOLN
6.0000 mg | INTRAMUSCULAR | Status: DC
  Administered 2019-01-02 – 2019-01-06 (×5): 6 mg via INTRAVENOUS
  Filled 2019-01-02 (×5): qty 1

## 2019-01-02 MED ORDER — AMITRIPTYLINE HCL 50 MG PO TABS
50.0000 mg | ORAL_TABLET | Freq: Every day | ORAL | Status: DC
  Administered 2019-01-02 – 2019-01-06 (×5): 50 mg via ORAL
  Filled 2019-01-02: qty 1
  Filled 2019-01-02: qty 2
  Filled 2019-01-02: qty 1
  Filled 2019-01-02 (×2): qty 2

## 2019-01-02 NOTE — ED Notes (Signed)
PT's spouse reports to the lobby to check for an update on his wife. Spouse reports he has been trying to call the number on the card he was given when he brought her in, and nobody will answer/help him. This tech informed PT's spouse that I would reach out to the nurse for her to contact the husband with an update. PT's RN informed, and provided number for spouse [phone]

## 2019-01-02 NOTE — ED Notes (Signed)
textpage midlevel regarding negative respiratory panel. Per Fausto Skillern we will accept patient provided results of positive test from Urgent Care and continue to treat at covid +

## 2019-01-02 NOTE — ED Notes (Signed)
Chris Corrigan(Husband#[phone]) called/would like a call back from his wife/or update.

## 2019-01-02 NOTE — H&P (Addendum)
History and Physical    Reece PackerMindy O Hesler ZOX:[id number] DOB: 12/07/1975 DOA: 01/02/2019  PCP: Pearline Cablesopland, Jessica C, MD  Patient coming from: Home  I have personally briefly reviewed patient's old medical records in [facility]  Chief Complaint: increase worsening shortness of breath  HPI: Kylie Galloway is a 43 y.o. female with medical history significant of hypertension and migraines who presents for increasing shortness of breath.  Reports being positive for COVID on 12/19 outpatient. Started with symptoms of cough and headache. Presented to ER on 12/23 for pleuritic chest pain. CT chest ruled out PE. Discharged home after given decadron and albuterol. However the last 2 days has had increasing shortness of breath even at rest and persistent fevers.  No nausea, vomiting or diarrhea or abdominal pain. A few co-workers have COVID.   Denies tobacco, alcohol or illicit drug use.  ED Course: She was febrile up to 104, normotensive, tachycardia up to 119 in sinus rhythm.  Initially placed on nonrebreather but was able to wean down to 2 L via nasal cannula with oxygen saturation of 96%. WBC of 5, hemoglobin 13.3. Sodium 137, potassium 3.6, glucose of 110, creatinine of 0.67, AST of 104, ALT of 94. LDH of 384.  Triglyceride 233.  Ferritin of 998.  CRP of 8.9.  Lactic acid of 1.9 procalcitonin of 0.54.  D-dimer 0.47.  Fibrinogen of 573. Chest x-ray shows progressive patchy bilateral airspace disease   Review of Systems:  Constitutional: No Weight Change, + Fever ENT/Mouth: No sore throat, No Rhinorrhea Eyes:  No Vision Changes Cardiovascular: No Chest Pain, +SOB, No PND, + Dyspnea on Exertion,  Respiratory: No Cough, No Sputum, No Wheezing, no Dyspnea  Gastrointestinal: No Nausea, No Vomiting, No Diarrhea, No Constipation, No Pain Genitourinary: no dysuria Musculoskeletal: No Arthralgias, No Myalgias Skin: No Skin Lesions, No Pruritus, Neuro: no Weakness, No Numbness Psych: no decrease  appetite Heme/Lymph: No Bruising, No Bleeding  Past Medical History:  Diagnosis Date  . Allergy   . Anxiety   . H/O Bell's palsy    rt sided  . Hypertension   . Migraine aura, persistent     Past Surgical History:  Procedure Laterality Date  . CESAREAN SECTION  2010     reports that she has never smoked. She has never used smokeless tobacco. She reports that she does not drink alcohol or use drugs.  No Known Allergies  Family History  Problem Relation Age of Onset  . CAD Paternal Grandfather 870  . CAD Paternal Grandmother 270  . Diabetes Maternal Grandmother   . Diabetes Maternal Grandfather   . Hypertension Brother      Prior to Admission medications   Medication Sig Start Date End Date Taking? Authorizing Provider  acetaminophen (TYLENOL) 500 MG tablet Take 1,000 mg by mouth every 6 (six) hours as needed for mild pain or fever.    [provider]  amitriptyline (ELAVIL) 50 MG tablet Take 1 tablet (50 mg total) by mouth at bedtime. 10/06/18   Penumalli, Glenford BayleyVikram R, MD  amoxicillin (AMOXIL) 875 MG tablet Take 875 mg by mouth 2 (two) times daily. 12/27/18   [provider]  Apple Cider Vinegar 500 MG TABS Take 500 mg by mouth daily.    [provider]  bisoprolol (ZEBETA) 5 MG tablet TAKE 1 TABLET BY MOUTH EVERY DAY Patient taking differently: Take 5 mg by mouth daily. TAKE 1 TABLET BY MOUTH EVERY DAY 10/15/18   Copland, Gwenlyn FoundJessica C, MD  BLISOVI 24  FE 1-20 MG-MCG(24) tablet Take 1 tablet by mouth daily. 10/06/18   [provider]  cholecalciferol (VITAMIN D3) 25 MCG (1000 UT) tablet Take 1,000 Units by mouth daily.    [provider]  fluticasone (FLONASE) 50 MCG/ACT nasal spray Place 2 sprays into both nostrils daily as needed for congestion. 12/27/18   [provider]  hydrochlorothiazide (HYDRODIURIL) 50 MG tablet Take 1 tablet (50 mg total) by mouth daily. 12/16/17   Copland, Gwenlyn Found, MD  ibuprofen (ADVIL) 200 MG tablet  Take 400 mg by mouth every 6 (six) hours as needed for moderate pain (fever).    [provider]  KLOR-CON M10 10 MEQ tablet TAKE 1 TABLET BY MOUTH EVERY DAY Patient taking differently: Take 10 mEq by mouth daily.  09/17/18   Copland, Gwenlyn Found, MD  Omega-3 Fatty Acids (FISH OIL) 1000 MG CAPS Take 1,000 mg by mouth daily.     [provider]  phentermine (ADIPEX-P) 37.5 MG tablet Take 37.5 mg by mouth daily. 10/14/17   [provider]  rizatriptan (MAXALT-MLT) 10 MG disintegrating tablet Take 1 tablet (10 mg total) by mouth as needed for migraine. May repeat in 2 hours if needed 10/06/18   Penumalli, Glenford Bayley, MD  topiramate (TOPAMAX) 50 MG tablet Take 1 tablet (50 mg total) by mouth 2 (two) times daily. 10/06/18   Penumalli, Glenford Bayley, MD  vitamin B-12 (CYANOCOBALAMIN) 1000 MCG tablet Take 1,000 mcg by mouth daily.    [provider]  vitamin C (ASCORBIC ACID) 500 MG tablet Take 1,000 mg by mouth daily.     [provider]    Physical Exam: Vitals:   01/02/19 1545 01/02/19 1600 01/02/19 1615 01/02/19 1643  BP: 124/75 118/72 124/62   Pulse: (!) 111 (!) 113 (!) 110   Resp: (!) 31 (!) 31 (!) 36   Temp:    99.7 F (37.6 C)  TempSrc:    Oral  SpO2: 94% 94% 94%     Constitutional: NAD, calm, comfortable, diaphoretic female laying flat in bed Vitals:   01/02/19 1545 01/02/19 1600 01/02/19 1615 01/02/19 1643  BP: 124/75 118/72 124/62   Pulse: (!) 111 (!) 113 (!) 110   Resp: (!) 31 (!) 31 (!) 36   Temp:    99.7 F (37.6 C)  TempSrc:    Oral  SpO2: 94% 94% 94%    Eyes: PERRL, lids and conjunctivae normal ENMT: Mucous membranes are moist. Neck: normal, supple Respiratory: bibasilar crackles worse on the right. Normal respiratory effort on 3L via [place]. No accessory muscle use.  Cardiovascular: Tachycardic in sinus rhythm, no murmurs / rubs / gallops. No extremity edema. 2+ pedal pulses. No carotid bruits.  Abdomen: no tenderness, no masses palpated. No  hepatosplenomegaly. Bowel sounds positive.  GU: Pure wick in place Musculoskeletal: no clubbing / cyanosis. No joint deformity upper and lower extremities. Good ROM, no contractures. Normal muscle tone.  Skin: no rashes, lesions, ulcers. No induration Neurologic: CN 2-12 grossly intact. Sensation intact. Strength 5/5 in all 4.  Psychiatric: Normal judgment and insight. Alert and oriented x 3. Normal mood.     Labs on Admission: I have personally reviewed following labs and imaging studies  CBC: Recent Labs  Lab 12/31/18 1142 01/02/19 1245  WBC 4.2 5.0  NEUTROABS  --  4.0  HGB 14.3 13.3  HCT 44.3 39.7  MCV 83.0 79.6*  PLT 172 175   Basic Metabolic Panel: Recent Labs  Lab 12/31/18 1142 01/02/19 1245  NA 136 137  K 4.0 3.6  CL 105 106  CO2 22 22  GLUCOSE 207* 110*  BUN 5* 6  CREATININE 0.77 0.67  CALCIUM 8.1* 7.9*   GFR: CrCl cannot be calculated (Unknown ideal weight.). Liver Function Tests: Recent Labs  Lab 01/02/19 1245  AST 104*  ALT 94*  ALKPHOS 45  BILITOT 0.4  PROT 6.2*  ALBUMIN 2.9*   No results for input(s): LIPASE, AMYLASE in the last 168 hours. No results for input(s): AMMONIA in the last 168 hours. Coagulation Profile: No results for input(s): INR, PROTIME in the last 168 hours. Cardiac Enzymes: No results for input(s): CKTOTAL, CKMB, CKMBINDEX, TROPONINI in the last 168 hours. BNP (last 3 results) No results for input(s): PROBNP in the last 8760 hours. HbA1C: No results for input(s): HGBA1C in the last 72 hours. CBG: No results for input(s): GLUCAP in the last 168 hours. Lipid Profile: Recent Labs    12/31/18 1257 01/02/19 1245  TRIG 158* 233*   Thyroid Function Tests: No results for input(s): TSH, T4TOTAL, FREET4, T3FREE, THYROIDAB in the last 72 hours. Anemia Panel: Recent Labs    12/31/18 1257 01/02/19 1245  FERRITIN 869* 998*   Urine analysis:    Component Value Date/Time   COLORURINE YELLOW 03/10/2008 [place] 03/10/2008 1359   LABSPEC 1.005 03/10/2008 1359   PHURINE 7.0 03/10/2008 1359   GLUCOSEU NEGATIVE 03/10/2008 1359   HGBUR NEGATIVE 03/10/2008 1359   BILIRUBINUR Negative 04/26/2014 1449   KETONESUR NEGATIVE 03/10/2008 1359   PROTEINUR Negative 04/26/2014 1449   PROTEINUR NEGATIVE 03/10/2008 1359   UROBILINOGEN 0.2 04/26/2014 1449   UROBILINOGEN 0.2 03/10/2008 1359   NITRITE Negative 04/26/2014 1449   NITRITE NEGATIVE 03/10/2008 1359   LEUKOCYTESUR Negative 04/26/2014 1449    Radiological Exams on Admission: DG Chest Port 1 View  Result Date: 01/02/2019 CLINICAL DATA:  Shortness of breath.  COVID positive. EXAM: PORTABLE CHEST 1 VIEW COMPARISON:  12/31/2018 FINDINGS: 1227 hours. Low volumes. The cardiopericardial silhouette is within normal limits for size. Slight progression of patchy bilateral airspace disease seen in the mid and lower lungs predominantly. No associated pleural effusion. The visualized bony structures of the thorax are intact. Telemetry leads overlie the chest. IMPRESSION: Low volume film with slight interval progression of patchy bilateral airspace disease. Electronically Signed   By: Misty Stanley [name]   On: 01/02/2019 12:44    EKG: Independently reviewed.   Assessment/Plan  Acute hypoxic respiratory failure secondary to COVID pneumonia LDH of 384.  Triglyceride 233.  Ferritin of 998.  CRP of 8.9.  Lactic acid of 1.9 procalcitonin of 0.54.  D-dimer 0.47.  Fibrinogen of 573. IV decadron Remdesivir Fever higher than normally seen with COVID- will also check RVP   Transaminitis  Due to COVID infection Monitor  Hypertension continue bisoprolol and HCTZ  Migraine continue amitriptyline and Topamax  DVT prophylaxis:.Lovenox Code Status:Full Family Communication: Plan discussed with patient at bedside  disposition Plan: Home with at least 2 midnight stays  Consults called:  Admission status: inpatient  Kylie Loeza T Jacklyn Branan DO [facility]   If  7PM-7AM, please contact night-coverage [email or web address] Password [facility] 2  01/02/2019, 5:32 PM

## 2019-01-02 NOTE — ED Triage Notes (Signed)
BIB EMS from home. Covid positive. C/o worsening sob since last night. EMS reports 86% on room air, 100% on non rebreather. HR 130-140. Pt is alertr and oriented

## 2019-01-02 NOTE — ED Provider Notes (Signed)
[place] EMERGENCY DEPARTMENT Provider Note   CSN: [id number] Arrival date & time:        History Chief Complaint  Patient presents with  . Shortness of Breath    Kylie Galloway is a 43 y.o. female.  HPI Kylie Galloway is a 43 y.o. female presents to emergency department with complaint of worsening shortness of breath.  Patient states she has been sick for about 8 days.  She was diagnosed with COVID-19 6 days ago.  She reports headache, congestion, cough.  She states cough has been getting worse and she was seen here 2 days ago.  She was found to be stable for discharge home at that time.  Today, patient states is harder for her to breathe.  Cough is dry nonproductive.  She denies smoking or history of asthma.  She has been running high fever at home up to 103, her last Tylenol was this morning at 7 AM, she states she took 2 tablets, not sure if regular or extra strength.  Per EMS, patient hypoxic with oxygen of 86 on room air.  Placed on nonrebreather and feeling better.      Past Medical History:  Diagnosis Date  . Allergy   . Anxiety   . H/O Bell's palsy    rt sided  . Hypertension   . Migraine aura, persistent     Patient Active Problem List   Diagnosis Date Noted  . Ingrown toenail 10/26/2017  . Migraine 12/25/2015  . Hypertension 01/24/2012    Past Surgical History:  Procedure Laterality Date  . CESAREAN SECTION  2010     OB History   No obstetric history on file.     Family History  Problem Relation Age of Onset  . CAD Paternal Grandfather 33  . CAD Paternal Grandmother 88  . Diabetes Maternal Grandmother   . Diabetes Maternal Grandfather   . Hypertension Brother     Social History   Tobacco Use  . Smoking status: Never Smoker  . Smokeless tobacco: Never Used  Substance Use Topics  . Alcohol use: No  . Drug use: No    Home Medications Prior to Admission medications   Medication Sig Start Date End Date Taking? Authorizing  Provider  acetaminophen (TYLENOL) 500 MG tablet Take 1,000 mg by mouth every 6 (six) hours as needed for mild pain or fever.    [provider]  amitriptyline (ELAVIL) 50 MG tablet Take 1 tablet (50 mg total) by mouth at bedtime. 10/06/18   Penumalli, Earlean Polka, MD  amoxicillin (AMOXIL) 875 MG tablet Take 875 mg by mouth 2 (two) times daily. 12/27/18   [provider]  Apple Cider Vinegar 500 MG TABS Take 500 mg by mouth daily.    [provider]  bisoprolol (ZEBETA) 5 MG tablet TAKE 1 TABLET BY MOUTH EVERY DAY Patient taking differently: Take 5 mg by mouth daily. TAKE 1 TABLET BY MOUTH EVERY DAY 10/15/18   Copland, Gay Filler, MD  BLISOVI 24 FE 1-20 MG-MCG(24) tablet Take 1 tablet by mouth daily. 10/06/18   [provider]  cholecalciferol (VITAMIN D3) 25 MCG (1000 UT) tablet Take 1,000 Units by mouth daily.    [provider]  fluticasone (FLONASE) 50 MCG/ACT nasal spray Place 2 sprays into both nostrils daily as needed for congestion. 12/27/18   [provider]  hydrochlorothiazide (HYDRODIURIL) 50 MG tablet Take 1 tablet (50 mg total) by mouth daily. 12/16/17   Copland, Gay Filler,  MD  ibuprofen (ADVIL) 200 MG tablet Take 400 mg by mouth every 6 (six) hours as needed for moderate pain (fever).    [provider]  KLOR-CON M10 10 MEQ tablet TAKE 1 TABLET BY MOUTH EVERY DAY Patient taking differently: Take 10 mEq by mouth daily.  09/17/18   Copland, Gwenlyn FoundJessica C, MD  Omega-3 Fatty Acids (FISH OIL) 1000 MG CAPS Take 1,000 mg by mouth daily.     [provider]  phentermine (ADIPEX-P) 37.5 MG tablet Take 37.5 mg by mouth daily. 10/14/17   [provider]  rizatriptan (MAXALT-MLT) 10 MG disintegrating tablet Take 1 tablet (10 mg total) by mouth as needed for migraine. May repeat in 2 hours if needed 10/06/18   Penumalli, Glenford BayleyVikram R, MD  topiramate (TOPAMAX) 50 MG tablet Take 1 tablet (50 mg total) by mouth 2 (two) times daily. 10/06/18    Penumalli, Glenford BayleyVikram R, MD  vitamin B-12 (CYANOCOBALAMIN) 1000 MCG tablet Take 1,000 mcg by mouth daily.    [provider]  vitamin C (ASCORBIC ACID) 500 MG tablet Take 1,000 mg by mouth daily.     [provider]    Allergies    Patient has no known allergies.  Review of Systems   Review of Systems  Constitutional: Positive for appetite change, chills, fatigue and fever.  HENT: Positive for congestion. Negative for sore throat.   Respiratory: Positive for cough, chest tightness and shortness of breath.   Cardiovascular: Negative for chest pain, palpitations and leg swelling.  Gastrointestinal: Negative for abdominal pain, diarrhea, nausea and vomiting.  Genitourinary: Negative for dysuria, flank pain, pelvic pain, vaginal bleeding, vaginal discharge and vaginal pain.  Musculoskeletal: Positive for myalgias. Negative for neck pain and neck stiffness.  Skin: Negative for rash.  Neurological: Positive for weakness and headaches. Negative for dizziness.  All other systems reviewed and are negative.   Physical Exam Updated Vital Signs BP 131/85 (BP Location: Right Arm)   Pulse (!) 127   Temp (!) 104 F (40 C) (Oral)   Resp (!) 36   SpO2 100%   Physical Exam Vitals and nursing note reviewed.  Constitutional:      General: She is not in acute distress.    Appearance: She is well-developed.  HENT:     Head: Normocephalic.     Mouth/Throat:     Comments: Oral mucosa dry Eyes:     Conjunctiva/sclera: Conjunctivae normal.  Cardiovascular:     Rate and Rhythm: Regular rhythm. Tachycardia present.     Heart sounds: Normal heart sounds.  Pulmonary:     Effort: Tachypnea present.     Breath sounds: Decreased breath sounds present. No wheezing or rales.  Abdominal:     General: Bowel sounds are normal. There is no distension.     Palpations: Abdomen is soft.     Tenderness: There is no abdominal tenderness. There is no rebound.  Musculoskeletal:     Cervical  back: Neck supple.     Right lower leg: No edema.  Skin:    General: Skin is warm and dry.  Neurological:     Mental Status: She is alert.  Psychiatric:        Behavior: Behavior normal.     ED Results / Procedures / Treatments   Labs (all labs ordered are listed, but only abnormal results are displayed) Labs Reviewed  CBC WITH DIFFERENTIAL/PLATELET - Abnormal; Notable for the following components:      Result Value   MCV 79.6 (*)  All other components within normal limits  COMPREHENSIVE METABOLIC PANEL - Abnormal; Notable for the following components:   Glucose, Bld 110 (*)    Calcium 7.9 (*)    Total Protein 6.2 (*)    Albumin 2.9 (*)    AST 104 (*)    ALT 94 (*)    All other components within normal limits  LACTATE DEHYDROGENASE - Abnormal; Notable for the following components:   LDH 384 (*)    All other components within normal limits  FERRITIN - Abnormal; Notable for the following components:   Ferritin 998 (*)    All other components within normal limits  FIBRINOGEN - Abnormal; Notable for the following components:   Fibrinogen 573 (*)    All other components within normal limits  C-REACTIVE PROTEIN - Abnormal; Notable for the following components:   CRP 8.9 (*)    All other components within normal limits  TRIGLYCERIDES - Abnormal; Notable for the following components:   Triglycerides 233 (*)    All other components within normal limits  CULTURE, BLOOD (ROUTINE X 2)  CULTURE, BLOOD (ROUTINE X 2)  LACTIC ACID, PLASMA  D-DIMER, QUANTITATIVE (NOT AT [facility])  LACTIC ACID, PLASMA  PROCALCITONIN  I-STAT BETA HCG BLOOD, ED ([facility], [facility], [facility] ONLY)    EKG None  Radiology CT Angio Chest PE W and/or Wo Contrast  Result Date: 12/31/2018 CLINICAL DATA:  Shortness of breath. COVID positive. EXAM: CT ANGIOGRAPHY CHEST WITH CONTRAST TECHNIQUE: Multidetector CT imaging of the chest was performed using the standard protocol during bolus administration of intravenous contrast.  Multiplanar CT image reconstructions and MIPs were obtained to evaluate the vascular anatomy. CONTRAST:  OMNIPAQUE IOHEXOL 350 MG/ML SOLN COMPARISON:  One-view chest x-ray 12/31/2018 FINDINGS: Cardiovascular: The heart size is normal. The aortic arch and great vessels within normal limits. Pulmonary artery opacification is excellent. No focal filling defects are present to suggest pulmonary embolus. Pulmonary artery size is within normal limits. Mediastinum/Nodes: No significant mediastinal, hilar, or axillary adenopathy scratched at there is mild prominence of hilar lymphoid tissue bilaterally. Subcarinal lymph node is evident. No significant mediastinal or axillary adenopathy is present otherwise. Thoracic inlet is within normal limits. The esophagus is normal. Lungs/Pleura: Bilateral patchy lower lobe dependent airspace disease is present bilaterally. Less prominent patchy airspace disease is present the upper lobes bilaterally. Most prominent airspace disease is posteriorly in the right lower lobe. There is no pneumothorax or significant pleural effusion. Upper Abdomen: Diffuse fatty infiltration of the liver is noted. Musculoskeletal: The vertebral body heights are maintained. Calcified disc present at C5-6. Central canal is otherwise widely patent. Review of the MIP images confirms the above findings. IMPRESSION: 1. No pulmonary embolus. 2. Bilateral patchy airspace disease compatible with multifocal pneumonia. 3. Hilar and subcarinal lymphoid tissue is likely reactive. 4. Diffuse fatty infiltration of the liver. Electronically Signed   By: Marin Roberts [name]   On: 12/31/2018 17:37    Procedures Procedures (including critical care time)  Medications Ordered in ED Medications  acetaminophen (TYLENOL) tablet 1,000 mg (has no administration in time range)    ED Course  I have reviewed the triage vital signs and the nursing notes.  Pertinent labs & imaging results that were available  during my care of the patient were reviewed by me and considered in my medical decision making (see chart for details).  12:27 PM patient seen and examined.  Patient with positive COVID-19 diagnosed 6 days ago, who was actually here 2 days ago but  was stable for discharge home at that time.  Today coming in for worsening shortness of breath.  Patient is tachypneic, febrile at 104, last Tylenol at 7 AM.  She is tachycardic.  Blood pressure is normal.  Initially hypoxic with oxygen level of 86% on room air, placed on nonrebreather by EMS and was at 100%, will switch her to 2 L, currently 98% on 2 L.  Patient is denying any pain, specifically chest pain.  She has no other complaints.  We will repeat labs, chest x-ray, administer 1 g of Tylenol, most likely admit  2:16 PM Pt still tachycardic. No worsening symptoms but no improvement. Temp rechecked 102.4. Will add motrin. Pt is drinking oral fluids, tolerating well.   2:23 PM Spoke with hospitalist, will admit for hypoxia and COVID-19 pneumonia.    MDM Rules/Calculators/A&P                     Patient with worsening COVID-19 symptoms, including cough and shortness of breath.  Chest x-ray today shows worsening infiltrates.  Patient is hypoxic initially, oxygen saturation is 100% on 2 L.  Normal blood pressure.  She is tachycardic but febrile 104.  Tylenol initially ordered with improvement in her temperature down to 102.4.  I have ordered additional Motrin.  Discussed case with hospitalist who will admit her for further evaluation and treatment.  Vitals:   01/02/19 1212 01/02/19 1215 01/02/19 1216 01/02/19 1416  BP:  131/85 131/85   Pulse:  (!) 127 (!) 127   Resp:  (!) 32 (!) 36   Temp:   (!) 104 F (40 C) (!) 102.4 F (39.1 C)  TempSrc:   Oral Oral  SpO2: (!) 86% 100% 100%     Final Clinical Impression(s) / ED Diagnoses Final diagnoses:  Pneumonia due to COVID-19 virus  Hypoxia  Shortness of breath    Rx / DC Orders ED Discharge  Orders    None       Jaynie Crumble, PA-C 01/02/19 1424    Linwood Dibbles, MD 01/02/19 2129

## 2019-01-03 LAB — COMPREHENSIVE METABOLIC PANEL
ALT: 80 U/L — ABNORMAL HIGH (ref 0–44)
AST: 75 U/L — ABNORMAL HIGH (ref 15–41)
Albumin: 2.8 g/dL — ABNORMAL LOW (ref 3.5–5.0)
Alkaline Phosphatase: 42 U/L (ref 38–126)
Anion gap: 10 (ref 5–15)
BUN: 11 mg/dL (ref 6–20)
CO2: 24 mmol/L (ref 22–32)
Calcium: 8.2 mg/dL — ABNORMAL LOW (ref 8.9–10.3)
Chloride: 108 mmol/L (ref 98–111)
Creatinine, Ser: 0.66 mg/dL (ref 0.44–1.00)
GFR calc Af Amer: >60 mL/min (ref >60)
GFR calc non Af Amer: >60 mL/min (ref >60)
Glucose, Bld: 174 mg/dL — ABNORMAL HIGH (ref 70–99)
Potassium: 4.2 mmol/L (ref 3.5–5.1)
Sodium: 142 mmol/L (ref 135–145)
Total Bilirubin: 0.8 mg/dL (ref 0.3–1.2)
Total Protein: 6.3 g/dL — ABNORMAL LOW (ref 6.5–8.1)

## 2019-01-03 LAB — CBC WITH DIFFERENTIAL/PLATELET
Abs Immature Granulocytes: 0.04 10*3/uL (ref 0.00–0.07)
Basophils Absolute: 0 10*3/uL (ref 0.0–0.1)
Basophils Relative: 0 %
Eosinophils Absolute: 0 10*3/uL (ref 0.0–0.5)
Eosinophils Relative: 0 %
HCT: 40.8 % (ref 36.0–46.0)
Hemoglobin: 13.3 g/dL (ref 12.0–15.0)
Immature Granulocytes: 2 %
Lymphocytes Relative: 25 %
Lymphs Abs: 0.7 10*3/uL (ref 0.7–4.0)
MCH: 26.4 pg (ref 26.0–34.0)
MCHC: 32.6 g/dL (ref 30.0–36.0)
MCV: 81 fL (ref 80.0–100.0)
Monocytes Absolute: 0.1 10*3/uL (ref 0.1–1.0)
Monocytes Relative: 5 %
Neutro Abs: 1.8 10*3/uL (ref 1.7–7.7)
Neutrophils Relative %: 68 %
Platelets: 175 10*3/uL (ref 150–400)
RBC: 5.04 MIL/uL (ref 3.87–5.11)
RDW: 13.8 % (ref 11.5–15.5)
WBC: 2.7 10*3/uL — ABNORMAL LOW (ref 4.0–10.5)
nRBC: 0 % (ref 0.0–0.2)

## 2019-01-03 MED ORDER — ZINC SULFATE 220 (50 ZN) MG PO CAPS
220.0000 mg | ORAL_CAPSULE | Freq: Every day | ORAL | Status: DC
  Administered 2019-01-04 – 2019-01-07 (×4): 220 mg via ORAL
  Filled 2019-01-03 (×4): qty 1

## 2019-01-03 MED ORDER — IBUPROFEN 400 MG PO TABS
400.0000 mg | ORAL_TABLET | Freq: Four times a day (QID) | ORAL | Status: DC | PRN
  Administered 2019-01-03: 400 mg via ORAL
  Filled 2019-01-03: qty 1

## 2019-01-03 MED ORDER — GUAIFENESIN-DM 100-10 MG/5ML PO SYRP
5.0000 mL | ORAL_SOLUTION | ORAL | Status: DC | PRN
  Administered 2019-01-03: 5 mL via ORAL
  Filled 2019-01-03: qty 5

## 2019-01-03 MED ORDER — ASCORBIC ACID 500 MG PO TABS
500.0000 mg | ORAL_TABLET | Freq: Every day | ORAL | Status: DC
  Administered 2019-01-04 – 2019-01-07 (×4): 500 mg via ORAL
  Filled 2019-01-03 (×4): qty 1

## 2019-01-03 MED ORDER — HYDROCOD POLST-CPM POLST ER 10-8 MG/5ML PO SUER
5.0000 mL | Freq: Two times a day (BID) | ORAL | Status: DC
  Administered 2019-01-04 – 2019-01-07 (×6): 5 mL via ORAL
  Filled 2019-01-03 (×6): qty 5

## 2019-01-03 NOTE — ED Notes (Signed)
C/o headache, sore throat-- tylenol given

## 2019-01-03 NOTE — ED Notes (Addendum)
Food tray delivered

## 2019-01-03 NOTE — ED Notes (Signed)
Gave patient a bath and did a full linen change

## 2019-01-03 NOTE — ED Notes (Signed)
The pt just woke up she no longer has a headache

## 2019-01-03 NOTE — ED Notes (Signed)
MS   Breakfast ordered  

## 2019-01-03 NOTE — Progress Notes (Addendum)
PROGRESS NOTE    Kylie Galloway  MPN:[id number] DOB: July 14, 1975 DOA: 01/02/2019 PCP: Pearline Cables, MD    Brief Narrative:  43 year old female who presented with worsening dyspnea.  She does have significant past medical history for hypertension and migraines.  She tested positive for COVID-19 December 19 her symptoms consistent with cough and headache.  On December 23 she developed pleuritic chest pain, in the emergency room she underwent CT chest which ruled out pulmonary embolism, she was discharged with albuterol and dexamethasone.  At home her symptoms continue to worsen with progressive dyspnea and persistent fever.  On her initial physical examination her temperature was 104 F, heart rate 119, blood pressure 124/75, respiratory rate 36, oxygen saturation 94% on supplemental oxygen.  She had bibasilar rales, more right than left, heart is also present rhythmic, tachycardic, abdomen soft, no lower extremity edema. Sodium 137, potassium 3.6, chloride 106, bicarb 22, glucose 110, BUN 6, creatinine 0.67, white count 5.0, hemoglobin 13.3, hematocrit 39.7, platelets 175.  Her chest radiograph had worsening bilateral interstitial infiltrates, right upper lobe, right lower lobe, left upper lobe, left lower lobe.  EKG 117 bpm, normal axis, normal intervals, manually corrected QTC 0.444, no ST segment or T wave changes.   Patient was admitted to the hospital with working diagnosis of acute hypoxic respiratory failure due to SARS COVID-19 viral pneumonia   Assessment & Plan:   Principal Problem:   Pneumonia due to COVID-19 virus Active Problems:   Hypertension   Migraine   Respiratory failure with hypoxia (HCC)   1.  Acute hypoxic respiratory failure due to SARS COVID-19 viral pneumonia.  RR: 21  Pulse oxymetry: 99 Fi02: 3 L/ min per [place]  COVID-19 Labs  Recent Labs    01/02/19 1245  DDIMER 0.47  FERRITIN 998*  LDH 384*  CRP 8.9*    Lab Results  Component Value Date   SARSCOV2NAA Not Detected 10/31/2018    Inflammatory markers are trending down.   Continue medical therapy with Remdesivir #2/5 and systemic corticosteroids with dexamethasone 6 mg Iv q 24h. Will add antitussive agents and airway clearing techniques with flutter valve and incentive spirometer. Vitamin C and Zinc.  Will add ibuprofen as needed for headaches.   2. HTN. Blood pressure is 136/73, will hold on antihypertensive medications for now.  3. Migraine. Will continue with amitryptylne and topiramate.    DVT prophylaxis: enoxaparin   Code Status:  full Family Communication: no family at the bedside  Disposition Plan/ discharge barriers: pending clinical improvement.     Subjective: Patient continue to have cough, fatigue and dyspnea, no nausea or vomiting, no chest pain. Tolerating po well. Positive cough and oxygen desaturation on exertion.   Objective: Vitals:   01/03/19 0800 01/03/19 0921 01/03/19 0930 01/03/19 1000  BP: 122/79 131/75 136/82 136/73  Pulse: (!) 101 100 (!) 109 (!) 101  Resp:  (!) 21    Temp:  (!) 96.9 F (36.1 C)    TempSrc:  Axillary    SpO2: 96% 99% 98% 96%    Intake/Output Summary (Last 24 hours) at 01/03/2019 1049 Last data filed at 01/02/2019 1653 Gross per 24 hour  Intake --  Output 600 ml  Net -600 ml   There were no vitals filed for this visit.  Examination:   General: Not in pain. Mild dyspnea and deconditioning  Neurology: Awake and alert, non focal  E ENT: mild pallor, no icterus, oral mucosa moist Cardiovascular: No JVD. S1-S2 present, rhythmic. No lower  extremity edema. Pulmonary: positive breath sounds bilaterally. Gastrointestinal. Abdomen with no organomegaly, non tender, no rebound or guarding Skin. No rashes Musculoskeletal: no joint deformities     Data Reviewed: I have personally reviewed following labs and imaging studies  CBC: Recent Labs  Lab 12/31/18 1142 01/02/19 1245 01/03/19 0522  WBC 4.2 5.0 2.7*   NEUTROABS  --  4.0 1.8  HGB 14.3 13.3 13.3  HCT 44.3 39.7 40.8  MCV 83.0 79.6* 81.0  PLT 172 175 629   Basic Metabolic Panel: Recent Labs  Lab 12/31/18 1142 01/02/19 1245 01/03/19 0522  NA 136 137 142  K 4.0 3.6 4.2  CL 105 106 108  CO2 22 22 24   GLUCOSE 207* 110* 174*  BUN 5* 6 11  CREATININE 0.77 0.67 0.66  CALCIUM 8.1* 7.9* 8.2*   GFR: CrCl cannot be calculated (Unknown ideal weight.). Liver Function Tests: Recent Labs  Lab 01/02/19 1245 01/03/19 0522  AST 104* 75*  ALT 94* 80*  ALKPHOS 45 42  BILITOT 0.4 0.8  PROT 6.2* 6.3*  ALBUMIN 2.9* 2.8*   No results for input(s): LIPASE, AMYLASE in the last 168 hours. No results for input(s): AMMONIA in the last 168 hours. Coagulation Profile: No results for input(s): INR, PROTIME in the last 168 hours. Cardiac Enzymes: No results for input(s): CKTOTAL, CKMB, CKMBINDEX, TROPONINI in the last 168 hours. BNP (last 3 results) No results for input(s): PROBNP in the last 8760 hours. HbA1C: No results for input(s): HGBA1C in the last 72 hours. CBG: No results for input(s): GLUCAP in the last 168 hours. Lipid Profile: Recent Labs    12/31/18 1257 01/02/19 1245  TRIG 158* 233*   Thyroid Function Tests: No results for input(s): TSH, T4TOTAL, FREET4, T3FREE, THYROIDAB in the last 72 hours. Anemia Panel: Recent Labs    12/31/18 1257 01/02/19 1245  FERRITIN 869* 998*      Radiology Studies: I have reviewed all of the imaging during this hospital visit personally     Scheduled Meds: . amitriptyline  50 mg Oral QHS  . bisoprolol  5 mg Oral Daily  . dexamethasone (DECADRON) injection  6 mg Intravenous Q24H  . enoxaparin (LOVENOX) injection  40 mg Subcutaneous Q24H  . hydrochlorothiazide  50 mg Oral Daily  . potassium chloride  10 mEq Oral Daily  . topiramate  50 mg Oral BID   Continuous Infusions: . remdesivir 100 mg in NS 100 mL 100 mg (01/03/19 1002)     LOS: 1 day        Lynn Recendiz Gerome Apley, MD

## 2019-01-03 NOTE — ED Notes (Signed)
Pt sats dropped to 70s with O2 at 3 L/m/[place] when moving. Back to 98-100% when still.

## 2019-01-03 NOTE — ED Notes (Signed)
Just checked patients vitals and gave her fresh ice water

## 2019-01-04 ENCOUNTER — Inpatient Hospital Stay (HOSPITAL_COMMUNITY): Payer: BC Managed Care – PPO

## 2019-01-04 LAB — COMPREHENSIVE METABOLIC PANEL
ALT: 72 U/L — ABNORMAL HIGH (ref 0–44)
AST: 62 U/L — ABNORMAL HIGH (ref 15–41)
Albumin: 2.7 g/dL — ABNORMAL LOW (ref 3.5–5.0)
Alkaline Phosphatase: 45 U/L (ref 38–126)
Anion gap: 11 (ref 5–15)
BUN: 16 mg/dL (ref 6–20)
CO2: 23 mmol/L (ref 22–32)
Calcium: 8.8 mg/dL — ABNORMAL LOW (ref 8.9–10.3)
Chloride: 103 mmol/L (ref 98–111)
Creatinine, Ser: 0.61 mg/dL (ref 0.44–1.00)
GFR calc Af Amer: >60 mL/min (ref >60)
GFR calc non Af Amer: >60 mL/min (ref >60)
Glucose, Bld: 162 mg/dL — ABNORMAL HIGH (ref 70–99)
Potassium: 3.8 mmol/L (ref 3.5–5.1)
Sodium: 137 mmol/L (ref 135–145)
Total Bilirubin: 0.3 mg/dL (ref 0.3–1.2)
Total Protein: 6.3 g/dL — ABNORMAL LOW (ref 6.5–8.1)

## 2019-01-04 LAB — D-DIMER, QUANTITATIVE: D-Dimer, Quant: 0.31 ug/mL-FEU (ref 0.00–0.50)

## 2019-01-04 LAB — C-REACTIVE PROTEIN: CRP: 11.4 mg/dL — ABNORMAL HIGH (ref <1.0)

## 2019-01-04 LAB — FERRITIN: Ferritin: 1132 ng/mL — ABNORMAL HIGH (ref 11–307)

## 2019-01-04 MED ORDER — FUROSEMIDE 10 MG/ML IJ SOLN
40.0000 mg | Freq: Once | INTRAMUSCULAR | Status: AC
  Administered 2019-01-04: 40 mg via INTRAVENOUS
  Filled 2019-01-04: qty 4

## 2019-01-04 NOTE — ED Notes (Signed)
Pt has started her period-- normally takes birth control pills-- will check with MD re: ordering them. Becomes dyspneic on any exertion when rolling in bed.

## 2019-01-04 NOTE — ED Notes (Signed)
Report attempted 

## 2019-01-04 NOTE — ED Notes (Signed)
Turned O2 from 3L to 0L sats stayed around 88 for five min but pt felt like she couldn't breathe and got hysterical. Turned pt to 2L sats 94 now

## 2019-01-04 NOTE — ED Notes (Signed)
Pt provided incentive spirometer and education.

## 2019-01-04 NOTE — ED Notes (Addendum)
Off going RN messaged Dr Frederich Chick and asked him to call husband Gerald Stabs [phone]) with updates.

## 2019-01-04 NOTE — Progress Notes (Addendum)
[facility]  PROGRESS NOTE  Kylie Galloway:[id number] DOB: 03-22-1975 DOA: 01/02/2019 PCP: Darreld Mclean, MD   Brief HPI:   43 year old female with history of hypertension, migraine who was tested with COVID-19 infection on December 19, she had symptoms of cough and headache.  On December 23 patient developed pleuritic chest pain and in the ED she underwent CT chest which ruled out pulmonary embolism.  She was discharged on albuterol and dexamethasone.  At home her symptoms started to worsen with progressive dyspnea and fever.  So she came back to the ED.  In the ED she was found to be febrile with temperature 104 F, heart rate 119, blood pressure 124/75.  Chest x-ray showed bilateral interstitial infiltrates.  Patient was admitted with diagnosis of acute hypoxic respiratory failure due to SARS COVID-19 viral pneumonia.    Subjective   This morning patient is requiring 3 L/min of oxygen, O2 sats 100%, denies worsening shortness of breath.   Assessment/Plan:     1. Acute hypoxic respiratory failure-secondary to SARS COVID-19 pneumonia.  O2 sats 100% on 3 L/min of oxygen, respiratory rate 16/min.  Patient has elevated CRP 11.9 today.  She is currently on remdesivir and Decadron.  Repeat chest x-ray shows slight worsening of infiltrates.  We will try 1 dose of Lasix 40 mg IV x1.  Also discussed non-FDA approved therapies including convalescent plasma and Actemra for treatment of COVID-19.  If patient's inflammatory markers get worse or she requires more oxygen, patient is willing to try convalescent plasma.  Will follow.  Continue vitamin C, zinc.  Incentive spirometry, flutter valve.  2. Hypertension-blood pressure is soft, HCTZ, bisoprolol is on hold  3. Migraine-continue amitriptyline, Topamax   COVID-19 Labs  Recent Labs    01/02/19 1245 01/04/19 0356  DDIMER 0.47 0.31  FERRITIN 998* 1,132*  LDH 384*  --   CRP 8.9* 11.4*    Lab Results  Component Value Date    SARSCOV2NAA Not Detected 10/31/2018       CBC: Recent Labs  Lab 12/31/18 1142 01/02/19 1245 01/03/19 0522  WBC 4.2 5.0 2.7*  NEUTROABS  --  4.0 1.8  HGB 14.3 13.3 13.3  HCT 44.3 39.7 40.8  MCV 83.0 79.6* 81.0  PLT 172 175 956    Basic Metabolic Panel: Recent Labs  Lab 12/31/18 1142 01/02/19 1245 01/03/19 0522 01/04/19 0356  NA 136 137 142 137  K 4.0 3.6 4.2 3.8  CL 105 106 108 103  CO2 22 22 24 23   GLUCOSE 207* 110* 174* 162*  BUN 5* 6 11 16   CREATININE 0.77 0.67 0.66 0.61  CALCIUM 8.1* 7.9* 8.2* 8.8*       DVT prophylaxis: Lovenox  Code Status: Full code  Family Communication: Discussed with patient's husband on phone  Disposition Plan: likely home when medically ready for discharge      BMI  Estimated body mass index is 33.56 kg/m as calculated from the following:   Height as of 10/06/18: 5' 5.5" (1.664 m).   Weight as of 10/06/18: 92.9 kg.  Scheduled medications:  . amitriptyline  50 mg Oral QHS  . vitamin C  500 mg Oral Daily  . chlorpheniramine-HYDROcodone  5 mL Oral Q12H  . dexamethasone (DECADRON) injection  6 mg Intravenous Q24H  . enoxaparin (LOVENOX) injection  40 mg Subcutaneous Q24H  . topiramate  50 mg Oral BID  . zinc sulfate  220 mg Oral Daily        Antibiotics:  Anti-infectives (From admission, onward)   Start     Dose/Rate Route Frequency Ordered Stop   01/03/19 1000  remdesivir 100 mg in sodium chloride 0.9 % 100 mL IVPB     100 mg 200 mL/hr over 30 Minutes Intravenous Daily 01/02/19 1732 01/07/19 0959   01/02/19 1745  remdesivir 200 mg in sodium chloride 0.9% 250 mL IVPB     200 mg 580 mL/hr over 30 Minutes Intravenous Once 01/02/19 1732 01/02/19 1932       Objective   Vitals:   01/04/19 0633 01/04/19 0900 01/04/19 1100 01/04/19 1200  BP: 109/69 107/66 124/78 120/69  Pulse: 65 79 87 70  Resp: 16     Temp:      TempSrc:      SpO2: 100% 100% 100% 99%    Intake/Output Summary (Last 24 hours) at  01/04/2019 1300 Last data filed at 01/04/2019 1123 Gross per 24 hour  Intake 100 ml  Output 1700 ml  Net -1600 ml   There were no vitals filed for this visit.   Physical Examination:    General: Appears in no acute distress  Cardiovascular: S1-S2, regular, no murmur auscultated  Respiratory: Decreased breath sounds at lung bases  Abdomen: Abdomen is soft, nontender, no organomegaly  Extremities: Trace edema in the lower extremities  Neurologic: Alert, oriented x3, intact insight and judgment, no focal deficit noted     Data Reviewed: I have personally reviewed following labs and imaging studies   Recent Results (from the past 240 hour(s))  Blood culture (routine x 2)     Status: None (Preliminary result)   Collection Time: 12/31/18  1:20 PM   Specimen: BLOOD  Result Value Ref Range Status   Specimen Description BLOOD RIGHT ANTECUBITAL  Final   Special Requests   Final    BOTTLES DRAWN AEROBIC AND ANAEROBIC Blood Culture adequate volume   Culture   Final    NO GROWTH 4 DAYS Performed at [facility], [address] [place], [place] [address]    Report Status PENDING  Incomplete  Blood culture (routine x 2)     Status: None (Preliminary result)   Collection Time: 12/31/18  1:35 PM   Specimen: BLOOD  Result Value Ref Range Status   Specimen Description BLOOD LEFT ANTECUBITAL  Final   Special Requests   Final    BOTTLES DRAWN AEROBIC AND ANAEROBIC Blood Culture adequate volume   Culture   Final    NO GROWTH 4 DAYS Performed at [facility], [address] [place], [place] [address]    Report Status PENDING  Incomplete  Blood Culture (routine x 2)     Status: None (Preliminary result)   Collection Time: 01/02/19 12:45 PM   Specimen: BLOOD RIGHT ARM  Result Value Ref Range Status   Specimen Description BLOOD RIGHT ARM  Final   Special Requests   Final    BOTTLES DRAWN AEROBIC AND ANAEROBIC Blood Culture results may not be optimal due to an  inadequate volume of blood received in culture bottles   Culture   Final    NO GROWTH 2 DAYS Performed at [facility], [address] [place], [place] [address]    Report Status PENDING  Incomplete  Respiratory Panel by PCR     Status: None   Collection Time: 01/02/19  7:58 PM   Specimen: Nasopharyngeal Swab; Respiratory  Result Value Ref Range Status   Adenovirus NOT DETECTED NOT DETECTED Final  Coronavirus 229E NOT DETECTED NOT DETECTED Final    Comment: (NOTE) The Coronavirus on the Respiratory Panel, DOES NOT test for the novel  Coronavirus (2019 nCoV)    Coronavirus HKU1 NOT DETECTED NOT DETECTED Final   Coronavirus NL63 NOT DETECTED NOT DETECTED Final   Coronavirus OC43 NOT DETECTED NOT DETECTED Final   Metapneumovirus NOT DETECTED NOT DETECTED Final   Rhinovirus / Enterovirus NOT DETECTED NOT DETECTED Final   Influenza A NOT DETECTED NOT DETECTED Final   Influenza B NOT DETECTED NOT DETECTED Final   Parainfluenza Virus 1 NOT DETECTED NOT DETECTED Final   Parainfluenza Virus 2 NOT DETECTED NOT DETECTED Final   Parainfluenza Virus 3 NOT DETECTED NOT DETECTED Final   Parainfluenza Virus 4 NOT DETECTED NOT DETECTED Final   Respiratory Syncytial Virus NOT DETECTED NOT DETECTED Final   Bordetella pertussis NOT DETECTED NOT DETECTED Final   Chlamydophila pneumoniae NOT DETECTED NOT DETECTED Final   Mycoplasma pneumoniae NOT DETECTED NOT DETECTED Final    Comment: Performed at [facility], [address], [place], [place] [address]     Liver Function Tests: Recent Labs  Lab 01/02/19 1245 01/03/19 0522 01/04/19 0356  AST 104* 75* 62*  ALT 94* 80* 72*  ALKPHOS 45 42 45  BILITOT 0.4 0.8 0.3  PROT 6.2* 6.3* 6.3*  ALBUMIN 2.9* 2.8* 2.7*   No results for input(s): LIPASE, AMYLASE in the last 168 hours. No results for input(s): AMMONIA in the last 168 hours.  Cardiac Enzymes: No results for input(s): CKTOTAL, CKMB, CKMBINDEX, TROPONINI in the last 168  hours. BNP (last 3 results) No results for input(s): BNP in the last 8760 hours.  ProBNP (last 3 results) No results for input(s): PROBNP in the last 8760 hours.    Studies: DG Chest Port 1 View  Result Date: 01/04/2019 CLINICAL DATA:  COVID infection.  Worsening shortness of breath. EXAM: PORTABLE CHEST 1 VIEW COMPARISON:  01/02/2019 FINDINGS: 1112 hours. Low lung volumes. Patchy asymmetric ill-defined bilateral airspace disease noted with a mid and lower lung predominance. Opacity has progressed in the left mid lung peripherally. No pleural effusion. The cardiopericardial silhouette is within normal limits for size. The visualized bony structures of the thorax are intact. IMPRESSION: Mild progression of airspace disease in the left mid lung with bilateral patchy ill-defined airspace opacity compatible with multifocal pneumonia Electronically Signed   By: Kennith Center [name]   On: 01/04/2019 11:31     Admission status: Inpatient: Based on patients clinical presentation and evaluation of above clinical data, I have made determination that patient meets Inpatient criteria at this time.   Meredeth Ide   [facility] If 7PM-7AM, please contact night-coverage at [email or web address], Office  [phone]  password [email or web address]  01/04/2019, 1:00 PM  LOS: 2 days

## 2019-01-05 ENCOUNTER — Encounter (HOSPITAL_COMMUNITY): Payer: Self-pay | Admitting: Family Medicine

## 2019-01-05 LAB — COMPREHENSIVE METABOLIC PANEL
ALT: 73 U/L — ABNORMAL HIGH (ref 0–44)
AST: 70 U/L — ABNORMAL HIGH (ref 15–41)
Albumin: 3.1 g/dL — ABNORMAL LOW (ref 3.5–5.0)
Alkaline Phosphatase: 48 U/L (ref 38–126)
Anion gap: 15 (ref 5–15)
BUN: 21 mg/dL — ABNORMAL HIGH (ref 6–20)
CO2: 26 mmol/L (ref 22–32)
Calcium: 9 mg/dL (ref 8.9–10.3)
Chloride: 98 mmol/L (ref 98–111)
Creatinine, Ser: 0.88 mg/dL (ref 0.44–1.00)
GFR calc Af Amer: >60 mL/min (ref >60)
GFR calc non Af Amer: >60 mL/min (ref >60)
Glucose, Bld: 102 mg/dL — ABNORMAL HIGH (ref 70–99)
Potassium: 3.7 mmol/L (ref 3.5–5.1)
Sodium: 139 mmol/L (ref 135–145)
Total Bilirubin: 0.5 mg/dL (ref 0.3–1.2)
Total Protein: 6.6 g/dL (ref 6.5–8.1)

## 2019-01-05 LAB — CULTURE, BLOOD (ROUTINE X 2)
Culture: NO GROWTH
Culture: NO GROWTH
Special Requests: ADEQUATE
Special Requests: ADEQUATE

## 2019-01-05 LAB — D-DIMER, QUANTITATIVE: D-Dimer, Quant: <0.27 ug/mL-FEU (ref 0.00–0.50)

## 2019-01-05 LAB — C-REACTIVE PROTEIN: CRP: 4.2 mg/dL — ABNORMAL HIGH (ref <1.0)

## 2019-01-05 LAB — FERRITIN: Ferritin: 1207 ng/mL — ABNORMAL HIGH (ref 11–307)

## 2019-01-05 MED ORDER — FUROSEMIDE 10 MG/ML IJ SOLN
20.0000 mg | Freq: Once | INTRAMUSCULAR | Status: AC
  Administered 2019-01-05: 20 mg via INTRAVENOUS
  Filled 2019-01-05: qty 2

## 2019-01-05 MED ORDER — ALPRAZOLAM 0.5 MG PO TABS
0.5000 mg | ORAL_TABLET | Freq: Once | ORAL | Status: AC
  Administered 2019-01-05: 0.5 mg via ORAL
  Filled 2019-01-05: qty 1

## 2019-01-05 NOTE — Plan of Care (Signed)
  Problem: Respiratory: Goal: Will maintain a patent airway Outcome: Progressing   

## 2019-01-05 NOTE — Significant Event (Signed)
Rapid Response Event Note  Overview: Tachypnea- "second set of eyes" Time Called: 1831 Event Type: Respiratory  Initial Focused Assessment: Called for tachypnea, not an acute change. Per primary RN, patient has not been in respiratory distress or having accessory muscle use. Patient on 3L [place] with oxygen saturation of 100%. Primary RN has discussed concern for tachypnea with MD and attending believed it to be related to anxiety. Pt refused anti-anxiety medication earlier today. New order received for Xanax.   Interventions: Encouraged RN to treat anxiety with Xanax, as ordered by MD.  Plan of Care (if not transferred): RN to monitor for acute respiratory change such as increased oxygen requirements, shortness of breath, or new onset respiratory distress. RN to call rapid response for further needs.  Encouraged self-proning,mobility, sleep-wake cycle, and IS.  Event Summary:  Kylie Galloway

## 2019-01-05 NOTE — Progress Notes (Signed)
[facility]  PROGRESS NOTE  Kylie Galloway LYY:[id number] DOB: 1975/07/06 DOA: 01/02/2019 PCP: Darreld Mclean, MD   Brief HPI:   43 year old female with history of hypertension, migraine who was tested with COVID-19 infection on December 19, she had symptoms of cough and headache.  On December 23 patient developed pleuritic chest pain and in the ED she underwent CT chest which ruled out pulmonary embolism.  She was discharged on albuterol and dexamethasone.  At home her symptoms started to worsen with progressive dyspnea and fever.  So she came back to the ED.  In the ED she was found to be febrile with temperature 104 F, heart rate 119, blood pressure 124/75.  Chest x-ray showed bilateral interstitial infiltrates.  Patient was admitted with diagnosis of acute hypoxic respiratory failure due to SARS COVID-19 viral pneumonia.    Subjective   This morning patient's breathing has improved.  She is currently on 1 L of oxygen, O2 sats 99%.  She had good diuretic response with Lasix yesterday.   Assessment/Plan:     1. Acute hypoxic respiratory failure-secondary to SARS COVID-19 pneumonia.  Oxygen saturation has improved, she is now 100% on 1 L of oxygen by nasal cannula.  Respiratory rate 20/min.  CRP is down to 4.2.  Currently on remdesivir and Decadron.   Continue vitamin C, zinc.  Incentive spirometry, flutter valve.  Will give additional dose of Lasix 20 mg IV x1.  Patient had almost 3 L of urine output yesterday.  2. Hypertension-blood pressure is soft, HCTZ, bisoprolol is on hold  3. Migraine-continue amitriptyline, Topamax  SpO2: 99 % O2 Flow Rate (L/min): 1 L/min   COVID-19 Labs  Recent Labs    01/04/19 0356 01/05/19 0655  DDIMER 0.31 <0.27  FERRITIN 1,132* 1,207*  CRP 11.4* 4.2*    Lab Results  Component Value Date   SARSCOV2NAA Not Detected 10/31/2018       CBC: Recent Labs  Lab 12/31/18 1142 01/02/19 1245 01/03/19 0522  WBC 4.2 5.0 2.7*   NEUTROABS  --  4.0 1.8  HGB 14.3 13.3 13.3  HCT 44.3 39.7 40.8  MCV 83.0 79.6* 81.0  PLT 172 175 127    Basic Metabolic Panel: Recent Labs  Lab 12/31/18 1142 01/02/19 1245 01/03/19 0522 01/04/19 0356 01/05/19 0655  NA 136 137 142 137 139  K 4.0 3.6 4.2 3.8 3.7  CL 105 106 108 103 98  CO2 22 22 24 23 26   GLUCOSE 207* 110* 174* 162* 102*  BUN 5* 6 11 16  21*  CREATININE 0.77 0.67 0.66 0.61 0.88  CALCIUM 8.1* 7.9* 8.2* 8.8* 9.0       DVT prophylaxis: Lovenox  Code Status: Full code  Family Communication: Discussed with patient's husband on phone  Disposition Plan: likely home when medically ready for discharge      BMI  Estimated body mass index is 34.08 kg/m as calculated from the following:   Height as of this encounter: 5\' 5"  (1.651 m).   Weight as of 10/06/18: 92.9 kg.  Scheduled medications:  . amitriptyline  50 mg Oral QHS  . vitamin C  500 mg Oral Daily  . chlorpheniramine-HYDROcodone  5 mL Oral Q12H  . dexamethasone (DECADRON) injection  6 mg Intravenous Q24H  . enoxaparin (LOVENOX) injection  40 mg Subcutaneous Q24H  . topiramate  50 mg Oral BID  . zinc sulfate  220 mg Oral Daily        Antibiotics:   Anti-infectives (From admission, onward)  Start     Dose/Rate Route Frequency Ordered Stop   01/03/19 1000  remdesivir 100 mg in sodium chloride 0.9 % 100 mL IVPB     100 mg 200 mL/hr over 30 Minutes Intravenous Daily 01/02/19 1732 01/07/19 0959   01/02/19 1745  remdesivir 200 mg in sodium chloride 0.9% 250 mL IVPB     200 mg 580 mL/hr over 30 Minutes Intravenous Once 01/02/19 1732 01/02/19 1932       Objective   Vitals:   01/04/19 2000 01/04/19 2200 01/05/19 0000 01/05/19 0855  BP: 119/70 104/73 119/79 116/63  Pulse: 87 79 93 86  Resp:   20 (!) 22  Temp:   98 F (36.7 C) 98.2 F (36.8 C)  TempSrc:   Oral Oral  SpO2: 98% 97% 97% 99%  Height:   5\' 5"  (1.651 m)     Intake/Output Summary (Last 24 hours) at 01/05/2019  1324 Last data filed at 01/04/2019 1620 Gross per 24 hour  Intake --  Output 1500 ml  Net -1500 ml   There were no vitals filed for this visit.  12/26 1901 - 12/28 0700 In: 100  Out: 3050 [Urine:3050] Physical Examination:   General-appears in no acute distress  Heart-S1-S2, regular, no murmur auscultated  Lungs-decreased breath sounds noted in left upper lung field  Abdomen-soft, nontender, no organomegaly  Extremities-no edema in the lower extremities  Neuro-alert, oriented x3, no focal deficit noted     Data Reviewed: I have personally reviewed following labs and imaging studies   Recent Results (from the past 240 hour(s))  Blood culture (routine x 2)     Status: None (Preliminary result)   Collection Time: 12/31/18  1:20 PM   Specimen: BLOOD  Result Value Ref Range Status   Specimen Description BLOOD RIGHT ANTECUBITAL  Final   Special Requests   Final    BOTTLES DRAWN AEROBIC AND ANAEROBIC Blood Culture adequate volume   Culture   Final    NO GROWTH 4 DAYS Performed at [facility], [address] [place], [place] [address]    Report Status PENDING  Incomplete  Blood culture (routine x 2)     Status: None (Preliminary result)   Collection Time: 12/31/18  1:35 PM   Specimen: BLOOD  Result Value Ref Range Status   Specimen Description BLOOD LEFT ANTECUBITAL  Final   Special Requests   Final    BOTTLES DRAWN AEROBIC AND ANAEROBIC Blood Culture adequate volume   Culture   Final    NO GROWTH 4 DAYS Performed at [facility], [address] [place], [place] [address]    Report Status PENDING  Incomplete  Blood Culture (routine x 2)     Status: None (Preliminary result)   Collection Time: 01/02/19 12:45 PM   Specimen: BLOOD RIGHT ARM  Result Value Ref Range Status   Specimen Description BLOOD RIGHT ARM  Final   Special Requests   Final    BOTTLES DRAWN AEROBIC AND ANAEROBIC Blood Culture results may not be optimal due to an inadequate volume  of blood received in culture bottles   Culture   Final    NO GROWTH 2 DAYS Performed at [facility], [address] [place], [place] [address]    Report Status PENDING  Incomplete  Respiratory Panel by PCR     Status: None   Collection Time: 01/02/19  7:58 PM   Specimen: Nasopharyngeal Swab; Respiratory  Result Value Ref Range Status  Adenovirus NOT DETECTED NOT DETECTED Final   Coronavirus 229E NOT DETECTED NOT DETECTED Final    Comment: (NOTE) The Coronavirus on the Respiratory Panel, DOES NOT test for the novel  Coronavirus (2019 nCoV)    Coronavirus HKU1 NOT DETECTED NOT DETECTED Final   Coronavirus NL63 NOT DETECTED NOT DETECTED Final   Coronavirus OC43 NOT DETECTED NOT DETECTED Final   Metapneumovirus NOT DETECTED NOT DETECTED Final   Rhinovirus / Enterovirus NOT DETECTED NOT DETECTED Final   Influenza A NOT DETECTED NOT DETECTED Final   Influenza B NOT DETECTED NOT DETECTED Final   Parainfluenza Virus 1 NOT DETECTED NOT DETECTED Final   Parainfluenza Virus 2 NOT DETECTED NOT DETECTED Final   Parainfluenza Virus 3 NOT DETECTED NOT DETECTED Final   Parainfluenza Virus 4 NOT DETECTED NOT DETECTED Final   Respiratory Syncytial Virus NOT DETECTED NOT DETECTED Final   Bordetella pertussis NOT DETECTED NOT DETECTED Final   Chlamydophila pneumoniae NOT DETECTED NOT DETECTED Final   Mycoplasma pneumoniae NOT DETECTED NOT DETECTED Final    Comment: Performed at [facility], [address], [place], [place] [address]     Liver Function Tests: Recent Labs  Lab 01/02/19 1245 01/03/19 0522 01/04/19 0356 01/05/19 0655  AST 104* 75* 62* 70*  ALT 94* 80* 72* 73*  ALKPHOS 45 42 45 48  BILITOT 0.4 0.8 0.3 0.5  PROT 6.2* 6.3* 6.3* 6.6  ALBUMIN 2.9* 2.8* 2.7* 3.1*      Studies: DG Chest Port 1 View  Result Date: 01/04/2019 CLINICAL DATA:  COVID infection.  Worsening shortness of breath. EXAM: PORTABLE CHEST 1 VIEW COMPARISON:  01/02/2019 FINDINGS: 1112 hours.  Low lung volumes. Patchy asymmetric ill-defined bilateral airspace disease noted with a mid and lower lung predominance. Opacity has progressed in the left mid lung peripherally. No pleural effusion. The cardiopericardial silhouette is within normal limits for size. The visualized bony structures of the thorax are intact. IMPRESSION: Mild progression of airspace disease in the left mid lung with bilateral patchy ill-defined airspace opacity compatible with multifocal pneumonia Electronically Signed   By: Kennith Center [name]   On: 01/04/2019 11:31     Admission status: Inpatient: Based on patients clinical presentation and evaluation of above clinical data, I have made determination that patient meets Inpatient criteria at this time.   Meredeth Ide   [facility] If 7PM-7AM, please contact night-coverage at [email or web address], Office  [phone]  password [email or web address]  01/05/2019, 1:24 PM  LOS: 3 days

## 2019-01-05 NOTE — Progress Notes (Signed)
MD notified of pt's tachypnea in high 30s-40.  Pt not using accessory muscles and resting comfortably despite tachypnea.  Oxygen sats 97% on 1L [place].  Heart rate 102 bpm.  Pt stated feeling anxious.  Orders received for PRN Xanax.

## 2019-01-05 NOTE — Progress Notes (Signed)
MD notified of pt's tachypnea. Respirations in 58s.  Pt comfortable and not in distress.  When offered anxiety medication, pt refused.

## 2019-01-06 ENCOUNTER — Inpatient Hospital Stay (HOSPITAL_COMMUNITY): Payer: BC Managed Care – PPO

## 2019-01-06 DIAGNOSIS — R0602 Shortness of breath: Secondary | ICD-10-CM

## 2019-01-06 LAB — COMPREHENSIVE METABOLIC PANEL
ALT: 72 U/L — ABNORMAL HIGH (ref 0–44)
AST: 52 U/L — ABNORMAL HIGH (ref 15–41)
Albumin: 3.1 g/dL — ABNORMAL LOW (ref 3.5–5.0)
Alkaline Phosphatase: 50 U/L (ref 38–126)
Anion gap: 14 (ref 5–15)
BUN: 17 mg/dL (ref 6–20)
CO2: 24 mmol/L (ref 22–32)
Calcium: 8.9 mg/dL (ref 8.9–10.3)
Chloride: 99 mmol/L (ref 98–111)
Creatinine, Ser: 0.65 mg/dL (ref 0.44–1.00)
GFR calc Af Amer: >60 mL/min (ref >60)
GFR calc non Af Amer: >60 mL/min (ref >60)
Glucose, Bld: 144 mg/dL — ABNORMAL HIGH (ref 70–99)
Potassium: 3.7 mmol/L (ref 3.5–5.1)
Sodium: 137 mmol/L (ref 135–145)
Total Bilirubin: 0.8 mg/dL (ref 0.3–1.2)
Total Protein: 6.9 g/dL (ref 6.5–8.1)

## 2019-01-06 LAB — FERRITIN: Ferritin: 969 ng/mL — ABNORMAL HIGH (ref 11–307)

## 2019-01-06 LAB — D-DIMER, QUANTITATIVE: D-Dimer, Quant: <0.27 ug/mL-FEU (ref 0.00–0.50)

## 2019-01-06 LAB — C-REACTIVE PROTEIN: CRP: 2.5 mg/dL — ABNORMAL HIGH (ref <1.0)

## 2019-01-06 MED ORDER — ALPRAZOLAM 0.5 MG PO TABS: 0.5000 mg | ORAL_TABLET | Freq: Two times a day (BID) | ORAL | Status: DC | PRN

## 2019-01-06 MED ORDER — FUROSEMIDE 10 MG/ML IJ SOLN
20.0000 mg | Freq: Once | INTRAMUSCULAR | Status: AC
  Administered 2019-01-06: 20 mg via INTRAVENOUS
  Filled 2019-01-06: qty 2

## 2019-01-06 MED ORDER — POLYETHYLENE GLYCOL 3350 17 G PO PACK
17.0000 g | PACK | Freq: Every day | ORAL | Status: DC
  Administered 2019-01-06 – 2019-01-07 (×2): 17 g via ORAL
  Filled 2019-01-06 (×2): qty 1

## 2019-01-06 NOTE — Progress Notes (Signed)
MEWS Patient HR elevation due to getting up to use restroom.

## 2019-01-06 NOTE — Progress Notes (Signed)
[facility]  PROGRESS NOTE  Kylie PackerMindy O Galloway AVW:[id number] DOB: 10/14/1975 DOA: 01/02/2019 PCP: Pearline Cablesopland, Jessica C, MD   Brief HPI:   43 year old female with history of hypertension, migraine who was tested with COVID-19 infection on December 19, she had symptoms of cough and headache.  On December 23 patient developed pleuritic chest pain and in the ED she underwent CT chest which ruled out pulmonary embolism.  She was discharged on albuterol and dexamethasone.  At home her symptoms started to worsen with progressive dyspnea and fever.  So she came back to the ED.  In the ED she was found to be febrile with temperature 104 F, heart rate 119, blood pressure 124/75.  Chest x-ray showed bilateral interstitial infiltrates.  Patient was admitted with diagnosis of acute hypoxic respiratory failure due to SARS COVID-19 viral pneumonia.    Subjective   Patient seen and examined, breathing has improved.  Still requiring 2 L/min of oxygen, O2 sats 98%.  Chest x-ray shows stable bilateral opacities.  Inflammatory markers have improved.  CRP is down to 2.5.   Assessment/Plan:     1. Acute hypoxic respiratory failure-secondary to SARS COVID-19 pneumonia.  Oxygen saturation has improved, 92% on 2 L/min respiratory rate 20/min.  CRP is down to 2.5.  Today is her last day for remdesivir, will continue with Decadron.  ontinue vitamin C, zinc.  Incentive spirometry, flutter valve.  Patient had good diuretic response with Lasix.  Will give additional 20 mg IV Lasix x1.  2. Hypertension-blood pressure is soft, HCTZ, bisoprolol is on hold  3. Migraine-continue amitriptyline, Topamax  SpO2: 98 % O2 Flow Rate (L/min): 2 L/min   COVID-19 Labs  Recent Labs    01/04/19 0356 01/05/19 0655 01/06/19 0430  DDIMER 0.31 <0.27 <0.27  FERRITIN 1,132* 1,207* 969*  CRP 11.4* 4.2* 2.5*    Lab Results  Component Value Date   SARSCOV2NAA Not Detected 10/31/2018       CBC: Recent Labs  Lab  12/31/18 1142 01/02/19 1245 01/03/19 0522  WBC 4.2 5.0 2.7*  NEUTROABS  --  4.0 1.8  HGB 14.3 13.3 13.3  HCT 44.3 39.7 40.8  MCV 83.0 79.6* 81.0  PLT 172 175 175    Basic Metabolic Panel: Recent Labs  Lab 01/02/19 1245 01/03/19 0522 01/04/19 0356 01/05/19 0655 01/06/19 0430  NA 137 142 137 139 137  K 3.6 4.2 3.8 3.7 3.7  CL 106 108 103 98 99  CO2 22 24 23 26 24   GLUCOSE 110* 174* 162* 102* 144*  BUN 6 11 16  21* 17  CREATININE 0.67 0.66 0.61 0.88 0.65  CALCIUM 7.9* 8.2* 8.8* 9.0 8.9       DVT prophylaxis: Lovenox  Code Status: Full code  Family Communication: Discussed with patient's husband on phone  Disposition Plan: likely home when medically ready for discharge      BMI  Estimated body mass index is 34.08 kg/m as calculated from the following:   Height as of this encounter: 5\' 5"  (1.651 m).   Weight as of 10/06/18: 92.9 kg.  Scheduled medications:  . amitriptyline  50 mg Oral QHS  . vitamin C  500 mg Oral Daily  . chlorpheniramine-HYDROcodone  5 mL Oral Q12H  . dexamethasone (DECADRON) injection  6 mg Intravenous Q24H  . enoxaparin (LOVENOX) injection  40 mg Subcutaneous Q24H  . polyethylene glycol  17 g Oral Daily  . topiramate  50 mg Oral BID  . zinc sulfate  220 mg Oral Daily  Antibiotics:   Anti-infectives (From admission, onward)   Start     Dose/Rate Route Frequency Ordered Stop   01/03/19 1000  remdesivir 100 mg in sodium chloride 0.9 % 100 mL IVPB     100 mg 200 mL/hr over 30 Minutes Intravenous Daily 01/02/19 1732 01/06/19 1058   01/02/19 1745  remdesivir 200 mg in sodium chloride 0.9% 250 mL IVPB     200 mg 580 mL/hr over 30 Minutes Intravenous Once 01/02/19 1732 01/02/19 1932       Objective   Vitals:   01/05/19 2300 01/06/19 0610 01/06/19 0614 01/06/19 0807  BP: 105/73 117/84  124/86  Pulse: 89 88  90  Resp:    (!) 26  Temp: 98.6 F (37 C) 98.3 F (36.8 C)  98.2 F (36.8 C)  TempSrc: Oral Oral  Oral   SpO2: 99% 99% 96% 98%  Height:       No intake or output data in the 24 hours ending 01/06/19 1124 There were no vitals filed for this visit.  No intake/output data recorded. Physical Examination:   General-appears in no acute distress  Heart-S1-S2, regular, no murmur auscultated  Lungs-normal respiratory effort, decreased breath sounds at lung bases.  Abdomen-soft, nontender, no organomegaly  Extremities-no edema in the lower extremities  Neuro-alert, oriented x3, no focal deficit noted     Data Reviewed: I have personally reviewed following labs and imaging studies   Recent Results (from the past 240 hour(s))  Blood culture (routine x 2)     Status: None   Collection Time: 12/31/18  1:20 PM   Specimen: BLOOD  Result Value Ref Range Status   Specimen Description BLOOD RIGHT ANTECUBITAL  Final   Special Requests   Final    BOTTLES DRAWN AEROBIC AND ANAEROBIC Blood Culture adequate volume   Culture   Final    NO GROWTH 5 DAYS Performed at [facility], [address], [place], [place] [address]    Report Status 01/05/2019 FINAL  Final  Blood culture (routine x 2)     Status: None   Collection Time: 12/31/18  1:35 PM   Specimen: BLOOD  Result Value Ref Range Status   Specimen Description BLOOD LEFT ANTECUBITAL  Final   Special Requests   Final    BOTTLES DRAWN AEROBIC AND ANAEROBIC Blood Culture adequate volume   Culture   Final    NO GROWTH 5 DAYS Performed at [facility], [address] [place], [place] [address]    Report Status 01/05/2019 FINAL  Final  Blood Culture (routine x 2)     Status: None (Preliminary result)   Collection Time: 01/02/19 12:45 PM   Specimen: BLOOD RIGHT ARM  Result Value Ref Range Status   Specimen Description BLOOD RIGHT ARM  Final   Special Requests   Final    BOTTLES DRAWN AEROBIC AND ANAEROBIC Blood Culture results may not be optimal due to an inadequate volume of blood received in culture bottles   Culture    Final    NO GROWTH 4 DAYS Performed at [facility], [address] [place], [place] [address]    Report Status PENDING  Incomplete  Respiratory Panel by PCR     Status: None   Collection Time: 01/02/19  7:58 PM   Specimen: Nasopharyngeal Swab; Respiratory  Result Value Ref Range Status   Adenovirus NOT DETECTED NOT DETECTED Final   Coronavirus 229E NOT DETECTED NOT DETECTED Final  Comment: (NOTE) The Coronavirus on the Respiratory Panel, DOES NOT test for the novel  Coronavirus (2019 nCoV)    Coronavirus HKU1 NOT DETECTED NOT DETECTED Final   Coronavirus NL63 NOT DETECTED NOT DETECTED Final   Coronavirus OC43 NOT DETECTED NOT DETECTED Final   Metapneumovirus NOT DETECTED NOT DETECTED Final   Rhinovirus / Enterovirus NOT DETECTED NOT DETECTED Final   Influenza A NOT DETECTED NOT DETECTED Final   Influenza B NOT DETECTED NOT DETECTED Final   Parainfluenza Virus 1 NOT DETECTED NOT DETECTED Final   Parainfluenza Virus 2 NOT DETECTED NOT DETECTED Final   Parainfluenza Virus 3 NOT DETECTED NOT DETECTED Final   Parainfluenza Virus 4 NOT DETECTED NOT DETECTED Final   Respiratory Syncytial Virus NOT DETECTED NOT DETECTED Final   Bordetella pertussis NOT DETECTED NOT DETECTED Final   Chlamydophila pneumoniae NOT DETECTED NOT DETECTED Final   Mycoplasma pneumoniae NOT DETECTED NOT DETECTED Final    Comment: Performed at [facility], [place] [address] [place], [place] [address]     Liver Function Tests: Recent Labs  Lab 01/02/19 1245 01/03/19 0522 01/04/19 0356 01/05/19 0655 01/06/19 0430  AST 104* 75* 62* 70* 52*  ALT 94* 80* 72* 73* 72*  ALKPHOS 45 42 45 48 50  BILITOT 0.4 0.8 0.3 0.5 0.8  PROT 6.2* 6.3* 6.3* 6.6 6.9  ALBUMIN 2.9* 2.8* 2.7* 3.1* 3.1*      Studies: CXR Portable same day  Result Date: 01/06/2019 CLINICAL DATA:  Hypoxia. EXAM: PORTABLE CHEST 1 VIEW COMPARISON:  January 04, 2019. FINDINGS: The heart size and mediastinal contours are within  normal limits. Stable bilateral patchy airspace opacities are noted concerning for multifocal pneumonia. No pneumothorax or pleural effusion is noted. The visualized skeletal structures are unremarkable. IMPRESSION: Stable bilateral patchy airspace opacities are noted concerning for multifocal pneumonia. Electronically Signed   By: Marijo Conception [name]   On: 01/06/2019 09:50     Admission status: Inpatient: Based on patients clinical presentation and evaluation of above clinical data, I have made determination that patient meets Inpatient criteria at this time.   Oswald Hillock   [facility] If 7PM-7AM, please contact night-coverage at [email or web address], Office  [phone]  password [email or web address]  01/06/2019, 11:24 AM  LOS: 4 days

## 2019-01-07 LAB — CULTURE, BLOOD (ROUTINE X 2): Culture: NO GROWTH

## 2019-01-07 LAB — COMPREHENSIVE METABOLIC PANEL
ALT: 58 U/L — ABNORMAL HIGH (ref 0–44)
AST: 34 U/L (ref 15–41)
Albumin: 3 g/dL — ABNORMAL LOW (ref 3.5–5.0)
Alkaline Phosphatase: 49 U/L (ref 38–126)
Anion gap: 15 (ref 5–15)
BUN: 18 mg/dL (ref 6–20)
CO2: 24 mmol/L (ref 22–32)
Calcium: 9 mg/dL (ref 8.9–10.3)
Chloride: 97 mmol/L — ABNORMAL LOW (ref 98–111)
Creatinine, Ser: 0.66 mg/dL (ref 0.44–1.00)
GFR calc Af Amer: >60 mL/min (ref >60)
GFR calc non Af Amer: >60 mL/min (ref >60)
Glucose, Bld: 150 mg/dL — ABNORMAL HIGH (ref 70–99)
Potassium: 3.6 mmol/L (ref 3.5–5.1)
Sodium: 136 mmol/L (ref 135–145)
Total Bilirubin: 0.8 mg/dL (ref 0.3–1.2)
Total Protein: 6.7 g/dL (ref 6.5–8.1)

## 2019-01-07 LAB — C-REACTIVE PROTEIN: CRP: 1.2 mg/dL — ABNORMAL HIGH (ref <1.0)

## 2019-01-07 LAB — D-DIMER, QUANTITATIVE: D-Dimer, Quant: 0.27 ug/mL-FEU (ref 0.00–0.50)

## 2019-01-07 LAB — FERRITIN: Ferritin: 819 ng/mL — ABNORMAL HIGH (ref 11–307)

## 2019-01-07 MED ORDER — DEXAMETHASONE 6 MG PO TABS: 6.0000 mg | ORAL_TABLET | Freq: Every day | ORAL | 0 refills | Status: DC

## 2019-01-07 MED ORDER — ZINC SULFATE 220 (50 ZN) MG PO CAPS: 220.0000 mg | ORAL_CAPSULE | Freq: Every day | ORAL | 0 refills | Status: DC

## 2019-01-07 MED ORDER — POLYETHYLENE GLYCOL 3350 17 G PO PACK: 17.0000 g | PACK | Freq: Every day | ORAL | 0 refills | Status: DC | PRN

## 2019-01-07 NOTE — Progress Notes (Signed)
SATURATION QUALIFICATIONS: (This note is used to comply with regulatory documentation for home oxygen)  Patient Saturations on Room Air at Rest = 96%  Patient Saturations on Room Air while Ambulating = 81%  Patient Saturations on 2 Liters of oxygen while Ambulating =95%  Please briefly explain why patient needs home oxygen: Patient is oxygen dependent due to recovering from [place]

## 2019-01-07 NOTE — Discharge Summary (Signed)
Physician Discharge Summary  Kylie Galloway ZOX:[id number] DOB: [birth year] DOA: 01/02/2019  PCP: Pearline Cables, MD  Admit date: 01/02/2019 Discharge date: 01/07/2019  Time spent: 50 minutes  Recommendations for Outpatient Follow-up:  1. Need to quarantine till January 16, 2018, for total 3 weeks from the day of diagnosis 12/27/2018. 2. Follow-up PCP in 2 weeks.   Discharge Diagnoses:  Principal Problem:   Pneumonia due to COVID-19 virus Active Problems:   Hypertension   Migraine   Respiratory failure with hypoxia (HCC)   Discharge Condition: Stable  Diet recommendation: Regular diet  There were no vitals filed for this visit.  History of present illness:  43 year old female with history of hypertension, migraine who was tested with COVID-19 infection on December 19, she had symptoms of cough and headache.  On December 23 patient developed pleuritic chest pain and in the ED she underwent CT chest which ruled out pulmonary embolism.  She was discharged on albuterol and dexamethasone.  At home her symptoms started to worsen with progressive dyspnea and fever.  So she came back to the ED.  In the ED she was found to be febrile with temperature 104 F, heart rate 119, blood pressure 124/75.  Chest x-ray showed bilateral interstitial infiltrates.  Patient was admitted with diagnosis of acute hypoxic respiratory failure due to SARS COVID-19 viral pneumonia.   Hospital Course:  1. Acute hypoxic respiratory failure-secondary to SARS COVID-19 pneumonia.    Patient was started on IV remdesivir and Decadron.  She has completed 5 days of IV remdesivir and will be discharged on 6 mg p.o. Decadron for 5 more days.  She also required IV Lasix in the hospital, she had good diuresis with Lasix.  Patient is still requiring 2 L/min of oxygen.  Inflammatory markers are significantly improved.  Continue with zinc and vitamin C.  Incentive spirometry, flutter valve.  She will follow up with PCP in 2  weeks.    2. Hypertension-blood pressure medications were held in the hospital.  She can restart her HCTZ and bisoprolol.    3. Migraine-continue amitriptyline, Topamax   SpO2: 100 % O2 Flow Rate (L/min): 2 L/min  Procedures:    Consultations:    Discharge Exam: Vitals:   01/06/19 2313 01/07/19 0721  BP: 112/78 140/87  Pulse: 89 79  Resp:  16  Temp: 98.2 F (36.8 C) 97.6 F (36.4 C)  SpO2: 99% 100%    General: Appears in no acute distress Cardiovascular: S1-S2, regular Respiratory: Minimally decreased breath sounds at lung bases.  Discharge Instructions   Discharge Instructions    Diet - low sodium heart healthy   Complete by: As directed    Discharge instructions   Complete by: As directed    ?   Person Under Monitoring Name: JASON HAUGE  Location: [address] [place] [address]   Infection Prevention Recommendations for Individuals Confirmed to have, or Being Evaluated for, 2019 Novel Coronavirus (COVID-19) Infection Who Receive Care at Home  Individuals who are confirmed to have, or are being evaluated for, COVID-19 should follow the prevention steps below until a healthcare provider or local or state health department says they can return to normal activities.  Stay home except to get medical care You should restrict activities outside your home, except for getting medical care. Do not go to work, school, or public areas, and do not use public transportation or taxis.  Call ahead before visiting your doctor Before your medical appointment, call the  healthcare provider and tell them that you have, or are being evaluated for, COVID-19 infection. This will help the healthcare provider's office take steps to keep other people from getting infected. Ask your healthcare provider to call the local or state health department.  Monitor your symptoms Seek prompt medical attention if your illness is worsening (e.g., difficulty  breathing). Before going to your medical appointment, call the healthcare provider and tell them that you have, or are being evaluated for, COVID-19 infection. Ask your healthcare provider to call the local or state health department.  Wear a facemask You should wear a facemask that covers your nose and mouth when you are in the same room with other people and when you visit a healthcare provider. People who live with or visit you should also wear a facemask while they are in the same room with you.  Separate yourself from other people in your home As much as possible, you should stay in a different room from other people in your home. Also, you should use a separate bathroom, if available.  Avoid sharing household items You should not share dishes, drinking glasses, cups, eating utensils, towels, bedding, or other items with other people in your home. After using these items, you should wash them thoroughly with soap and water.  Cover your coughs and sneezes Cover your mouth and nose with a tissue when you cough or sneeze, or you can cough or sneeze into your sleeve. Throw used tissues in a lined trash can, and immediately wash your hands with soap and water for at least 20 seconds or use an alcohol-based hand rub.  Wash your [employer] your hands often and thoroughly with soap and water for at least 20 seconds. You can use an alcohol-based hand sanitizer if soap and water are not available and if your hands are not visibly dirty. Avoid touching your eyes, nose, and mouth with unwashed hands.   Prevention Steps for Caregivers and Household Members of Individuals Confirmed to have, or Being Evaluated for, COVID-19 Infection Being Cared for in the Home  If you live with, or provide care at home for, a person confirmed to have, or being evaluated for, COVID-19 infection please follow these guidelines to prevent infection:  Follow healthcare provider's instructions Make sure that you  understand and can help the patient follow any healthcare provider instructions for all care.  Provide for the patient's basic needs You should help the patient with basic needs in the home and provide support for getting groceries, prescriptions, and other personal needs.  Monitor the patient's symptoms If they are getting sicker, call his or her medical provider and tell them that the patient has, or is being evaluated for, COVID-19 infection. This will help the healthcare provider's office take steps to keep other people from getting infected. Ask the healthcare provider to call the local or state health department.  Limit the number of people who have contact with the patient If possible, have only one caregiver for the patient. Other household members should stay in another home or place of residence. If this is not possible, they should stay in another room, or be separated from the patient as much as possible. Use a separate bathroom, if available. Restrict visitors who do not have an essential need to be in the home.  Keep older adults, very [place] children, and other sick people away from the patient Keep older adults, very [place] children, and those who have compromised immune systems or chronic health  conditions away from the patient. This includes people with chronic heart, lung, or kidney conditions, diabetes, and cancer.  Ensure good ventilation Make sure that shared spaces in the home have good air flow, such as from an air conditioner or an opened window, weather permitting.  Wash your hands often Wash your hands often and thoroughly with soap and water for at least 20 seconds. You can use an alcohol based hand sanitizer if soap and water are not available and if your hands are not visibly dirty. Avoid touching your eyes, nose, and mouth with unwashed hands. Use disposable paper towels to dry your hands. If not available, use dedicated cloth towels and replace them when they  become wet.  Wear a facemask and gloves Wear a disposable facemask at all times in the room and gloves when you touch or have contact with the patient's blood, body fluids, and/or secretions or excretions, such as sweat, saliva, sputum, nasal mucus, vomit, urine, or feces.  Ensure the mask fits over your nose and mouth tightly, and do not touch it during use. Throw out disposable facemasks and gloves after using them. Do not reuse. Wash your hands immediately after removing your facemask and gloves. If your personal clothing becomes contaminated, carefully remove clothing and launder. Wash your hands after handling contaminated clothing. Place all used disposable facemasks, gloves, and other waste in a lined container before disposing them with other household waste. Remove gloves and wash your hands immediately after handling these items.  Do not share dishes, glasses, or other household items with the patient Avoid sharing household items. You should not share dishes, drinking glasses, cups, eating utensils, towels, bedding, or other items with a patient who is confirmed to have, or being evaluated for, COVID-19 infection. After the person uses these items, you should wash them thoroughly with soap and water.  Wash laundry thoroughly Immediately remove and wash clothes or bedding that have blood, body fluids, and/or secretions or excretions, such as sweat, saliva, sputum, nasal mucus, vomit, urine, or feces, on them. Wear gloves when handling laundry from the patient. Read and follow directions on labels of laundry or clothing items and detergent. In general, wash and dry with the warmest temperatures recommended on the label.  Clean all areas the individual has used often Clean all touchable surfaces, such as counters, tabletops, doorknobs, bathroom fixtures, toilets, phones, keyboards, tablets, and bedside tables, every day. Also, clean any surfaces that may have blood, body fluids, and/or  secretions or excretions on them. Wear gloves when cleaning surfaces the patient has come in contact with. Use a diluted bleach solution (e.g., dilute bleach with 1 part bleach and 10 parts water) or a household disinfectant with a label that says EPA-registered for coronaviruses. To make a bleach solution at home, add 1 tablespoon of bleach to 1 quart (4 cups) of water. For a larger supply, add  cup of bleach to 1 gallon (16 cups) of water. Read labels of cleaning products and follow recommendations provided on product labels. Labels contain instructions for safe and effective use of the cleaning product including precautions you should take when applying the product, such as wearing gloves or eye protection and making sure you have good ventilation during use of the product. Remove gloves and wash hands immediately after cleaning.  Monitor yourself for signs and symptoms of illness Caregivers and household members are considered close contacts, should monitor their health, and will be asked to limit movement outside of the home to the extent  possible. Follow the monitoring steps for close contacts listed on the symptom monitoring form.   ? If you have additional questions, contact your local health department or call the epidemiologist on call at [phone] (available 24/7). ? This guidance is subject to change. For the most up-to-date guidance from [facility], please refer to their website: [email or web address]   Increase activity slowly   Complete by: As directed      Allergies as of 01/07/2019   No Known Allergies     Medication List    STOP taking these medications   amoxicillin 875 MG tablet Commonly known as: AMOXIL   ibuprofen 200 MG tablet Commonly known as: ADVIL     TAKE these medications   acetaminophen 500 MG tablet Commonly known as: TYLENOL Take 1,000 mg by mouth every 6 (six) hours as needed for mild pain or  fever.   amitriptyline 50 MG tablet Commonly known as: ELAVIL Take 1 tablet (50 mg total) by mouth at bedtime.   Apple Cider Vinegar 500 MG Tabs Take 500 mg by mouth daily.   bisoprolol 5 MG tablet Commonly known as: ZEBETA TAKE 1 TABLET BY MOUTH EVERY DAY What changed:   how much to take  how to take this  when to take this   [place] 24 Fe 1-20 MG-MCG(24) tablet Generic drug: Norethindrone Acetate-Ethinyl Estrad-FE Take 1 tablet by mouth daily.   cholecalciferol 25 MCG (1000 UT) tablet Commonly known as: VITAMIN D3 Take 1,000 Units by mouth daily.   dexamethasone 6 MG tablet Commonly known as: DECADRON Take 1 tablet (6 mg total) by mouth daily.   Fish Oil 1000 MG Caps Take 1,000 mg by mouth daily.   fluticasone 50 MCG/ACT nasal spray Commonly known as: FLONASE Place 2 sprays into both nostrils daily as needed for congestion.   hydrochlorothiazide 50 MG tablet Commonly known as: HYDRODIURIL Take 1 tablet (50 mg total) by mouth daily.   Klor-Con M10 10 MEQ tablet Generic drug: potassium chloride TAKE 1 TABLET BY MOUTH EVERY DAY What changed: how much to take   phentermine 37.5 MG tablet Commonly known as: ADIPEX-P Take 37.5 mg by mouth daily.   polyethylene glycol 17 g packet Commonly known as: MIRALAX / GLYCOLAX Take 17 g by mouth daily as needed for moderate constipation.   rizatriptan 10 MG disintegrating tablet Commonly known as: MAXALT-MLT Take 1 tablet (10 mg total) by mouth as needed for migraine. May repeat in 2 hours if needed   topiramate 50 MG tablet Commonly known as: TOPAMAX Take 1 tablet (50 mg total) by mouth 2 (two) times daily.   vitamin B-12 1000 MCG tablet Commonly known as: CYANOCOBALAMIN Take 1,000 mcg by mouth daily.   vitamin C 500 MG tablet Commonly known as: ASCORBIC ACID Take 1,000 mg by mouth daily.   zinc sulfate 220 (50 Zn) MG capsule Take 1 capsule (220 mg total) by mouth daily. Start taking on: January 08, 2019             Durable Medical Equipment  (From admission, onward)         Start     Ordered   01/07/19 1015  For home use only DME oxygen  Once    Question Answer Comment  Length of Need 6 Months   Mode or (Route) Nasal cannula   Liters per Minute 2   Frequency Continuous (stationary and portable oxygen unit needed)   Oxygen conserving device Yes   Oxygen delivery system Gas  01/07/19 1014         No Known Allergies    The results of significant diagnostics from this hospitalization (including imaging, microbiology, ancillary and laboratory) are listed below for reference.    Significant Diagnostic Studies: CT Angio Chest PE W and/or Wo Contrast  Result Date: 12/31/2018 CLINICAL DATA:  Shortness of breath. COVID positive. EXAM: CT ANGIOGRAPHY CHEST WITH CONTRAST TECHNIQUE: Multidetector CT imaging of the chest was performed using the standard protocol during bolus administration of intravenous contrast. Multiplanar CT image reconstructions and MIPs were obtained to evaluate the vascular anatomy. CONTRAST:  100mL OMNIPAQUE IOHEXOL 350 MG/ML SOLN COMPARISON:  One-view chest x-ray 12/31/2018 FINDINGS: Cardiovascular: The heart size is normal. The aortic arch and great vessels within normal limits. Pulmonary artery opacification is excellent. No focal filling defects are present to suggest pulmonary embolus. Pulmonary artery size is within normal limits. Mediastinum/Nodes: No significant mediastinal, hilar, or axillary adenopathy scratched at there is mild prominence of hilar lymphoid tissue bilaterally. Subcarinal lymph node is evident. No significant mediastinal or axillary adenopathy is present otherwise. Thoracic inlet is within normal limits. The esophagus is normal. Lungs/Pleura: Bilateral patchy lower lobe dependent airspace disease is present bilaterally. Less prominent patchy airspace disease is present the upper lobes bilaterally. Most prominent airspace disease is  posteriorly in the right lower lobe. There is no pneumothorax or significant pleural effusion. Upper Abdomen: Diffuse fatty infiltration of the liver is noted. Musculoskeletal: The vertebral body heights are maintained. Calcified disc present at C5-6. Central canal is otherwise widely patent. Review of the MIP images confirms the above findings. IMPRESSION: 1. No pulmonary embolus. 2. Bilateral patchy airspace disease compatible with multifocal pneumonia. 3. Hilar and subcarinal lymphoid tissue is likely reactive. 4. Diffuse fatty infiltration of the liver. Electronically Signed   By: Marin Robertshristopher  Mattern [name]   On: 12/31/2018 17:37   DG Chest Port 1 View  Result Date: 01/04/2019 CLINICAL DATA:  COVID infection.  Worsening shortness of breath. EXAM: PORTABLE CHEST 1 VIEW COMPARISON:  01/02/2019 FINDINGS: 1112 hours. Low lung volumes. Patchy asymmetric ill-defined bilateral airspace disease noted with a mid and lower lung predominance. Opacity has progressed in the left mid lung peripherally. No pleural effusion. The cardiopericardial silhouette is within normal limits for size. The visualized bony structures of the thorax are intact. IMPRESSION: Mild progression of airspace disease in the left mid lung with bilateral patchy ill-defined airspace opacity compatible with multifocal pneumonia Electronically Signed   By: Kennith CenterEric  Mansell [name]   On: 01/04/2019 11:31   DG Chest Port 1 View  Result Date: 01/02/2019 CLINICAL DATA:  Shortness of breath.  COVID positive. EXAM: PORTABLE CHEST 1 VIEW COMPARISON:  12/31/2018 FINDINGS: 1227 hours. Low volumes. The cardiopericardial silhouette is within normal limits for size. Slight progression of patchy bilateral airspace disease seen in the mid and lower lungs predominantly. No associated pleural effusion. The visualized bony structures of the thorax are intact. Telemetry leads overlie the chest. IMPRESSION: Low volume film with slight interval progression of patchy  bilateral airspace disease. Electronically Signed   By: Kennith CenterEric  Mansell [name]   On: 01/02/2019 12:44   DG Chest Portable 1 View  Result Date: 12/31/2018 CLINICAL DATA:  Chest pain and shortness of breath in a patient who tested positive for COVID-19 12/27/2018. EXAM: PORTABLE CHEST 1 VIEW COMPARISON:  PA and lateral chest 06/21/2008. FINDINGS: Patchy airspace disease in the left mid and lower lung zone and right lung base is identified. Lung volumes are somewhat low. Heart size  is normal. No pneumothorax or pleural effusion. IMPRESSION: Patchy basilar airspace disease is more notable on the left and has an appearance worrisome for pneumonia. Electronically Signed   By: Drusilla Kanner [name]   On: 12/31/2018 12:29   CXR Portable same day  Result Date: 01/06/2019 CLINICAL DATA:  Hypoxia. EXAM: PORTABLE CHEST 1 VIEW COMPARISON:  January 04, 2019. FINDINGS: The heart size and mediastinal contours are within normal limits. Stable bilateral patchy airspace opacities are noted concerning for multifocal pneumonia. No pneumothorax or pleural effusion is noted. The visualized skeletal structures are unremarkable. IMPRESSION: Stable bilateral patchy airspace opacities are noted concerning for multifocal pneumonia. Electronically Signed   By: Lupita Raider [name]   On: 01/06/2019 09:50    Microbiology: Recent Results (from the past 240 hour(s))  Blood culture (routine x 2)     Status: None   Collection Time: 12/31/18  1:20 PM   Specimen: BLOOD  Result Value Ref Range Status   Specimen Description BLOOD RIGHT ANTECUBITAL  Final   Special Requests   Final    BOTTLES DRAWN AEROBIC AND ANAEROBIC Blood Culture adequate volume   Culture   Final    NO GROWTH 5 DAYS Performed at [facility], [address], [place], [place] [address]    Report Status 01/05/2019 FINAL  Final  Blood culture (routine x 2)     Status: None   Collection Time: 12/31/18  1:35 PM   Specimen: BLOOD  Result Value Ref Range  Status   Specimen Description BLOOD LEFT ANTECUBITAL  Final   Special Requests   Final    BOTTLES DRAWN AEROBIC AND ANAEROBIC Blood Culture adequate volume   Culture   Final    NO GROWTH 5 DAYS Performed at [facility], [address] [place], [place] [address]    Report Status 01/05/2019 FINAL  Final  Blood Culture (routine x 2)     Status: None (Preliminary result)   Collection Time: 01/02/19 12:45 PM   Specimen: BLOOD RIGHT ARM  Result Value Ref Range Status   Specimen Description BLOOD RIGHT ARM  Final   Special Requests   Final    BOTTLES DRAWN AEROBIC AND ANAEROBIC Blood Culture results may not be optimal due to an inadequate volume of blood received in culture bottles   Culture   Final    NO GROWTH 4 DAYS Performed at [facility], [address], [place], [place] [address]    Report Status PENDING  Incomplete  Respiratory Panel by PCR     Status: None   Collection Time: 01/02/19  7:58 PM   Specimen: Nasopharyngeal Swab; Respiratory  Result Value Ref Range Status   Adenovirus NOT DETECTED NOT DETECTED Final   Coronavirus 229E NOT DETECTED NOT DETECTED Final    Comment: (NOTE) The Coronavirus on the Respiratory Panel, DOES NOT test for the novel  Coronavirus (2019 nCoV)    Coronavirus HKU1 NOT DETECTED NOT DETECTED Final   Coronavirus NL63 NOT DETECTED NOT DETECTED Final   Coronavirus OC43 NOT DETECTED NOT DETECTED Final   Metapneumovirus NOT DETECTED NOT DETECTED Final   Rhinovirus / Enterovirus NOT DETECTED NOT DETECTED Final   Influenza A NOT DETECTED NOT DETECTED Final   Influenza B NOT DETECTED NOT DETECTED Final   Parainfluenza Virus 1 NOT DETECTED NOT DETECTED Final   Parainfluenza Virus 2 NOT DETECTED NOT DETECTED Final   Parainfluenza Virus 3 NOT DETECTED NOT DETECTED Final   Parainfluenza Virus 4  NOT DETECTED NOT DETECTED Final   Respiratory Syncytial Virus NOT DETECTED NOT DETECTED Final   Bordetella pertussis NOT DETECTED NOT DETECTED Final    Chlamydophila pneumoniae NOT DETECTED NOT DETECTED Final   Mycoplasma pneumoniae NOT DETECTED NOT DETECTED Final    Comment: Performed at [facility], [address], [place], [place] [address]     Labs: Basic Metabolic Panel: Recent Labs  Lab 01/03/19 0522 01/04/19 0356 01/05/19 0655 01/06/19 0430 01/07/19 0441  NA 142 137 139 137 136  K 4.2 3.8 3.7 3.7 3.6  CL 108 103 98 99 97*  CO2 24 23 26 24 24   GLUCOSE 174* 162* 102* 144* 150*  BUN 11 16 21* 17 18  CREATININE 0.66 0.61 0.88 0.65 0.66  CALCIUM 8.2* 8.8* 9.0 8.9 9.0   Liver Function Tests: Recent Labs  Lab 01/03/19 0522 01/04/19 0356 01/05/19 0655 01/06/19 0430 01/07/19 0441  AST 75* 62* 70* 52* 34  ALT 80* 72* 73* 72* 58*  ALKPHOS 42 45 48 50 49  BILITOT 0.8 0.3 0.5 0.8 0.8  PROT 6.3* 6.3* 6.6 6.9 6.7  ALBUMIN 2.8* 2.7* 3.1* 3.1* 3.0*   No results for input(s): LIPASE, AMYLASE in the last 168 hours. No results for input(s): AMMONIA in the last 168 hours. CBC: Recent Labs  Lab 12/31/18 1142 01/02/19 1245 01/03/19 0522  WBC 4.2 5.0 2.7*  NEUTROABS  --  4.0 1.8  HGB 14.3 13.3 13.3  HCT 44.3 39.7 40.8  MCV 83.0 79.6* 81.0  PLT 172 175 175       Signed:  01/05/19 MD.  [facility] 01/07/2019, 10:38 AM

## 2019-01-07 NOTE — Discharge Instructions (Signed)
    Person Under Monitoring Name: Kylie Galloway  Location: [place] [address]                                                          You need to quarantine till January 9.    CORONAVIRUS DISEASE 2019 (COVID-19) Guidance for Persons Under Investigation You are being tested for the virus that causes coronavirus disease 2019 (COVID-19). Public health actions are necessary to ensure protection of your health and the health of others, and to prevent further spread of infection. COVID-19 is caused by a virus that can cause symptoms, such as fever, cough, and shortness of breath. The primary transmission from person to person is by coughing or sneezing. On February 06, 2018, the [place] announced a [employer] Emergency of International Concern and on February 07, 2018 the U.[place]. Department of Health and Human Services declared a public health emergency. If the virus that causesCOVID-19 spreads in the community, it could have severe public health consequences.  As a person under investigation for COVID-19, the [place] advises you to adhere to the following guidance until your test results are reported to you. If your test result is positive, you will receive additional information from your provider and your local health department at that time.   Remain at home until you are cleared by your health provider or public health authorities.   Keep a log of visitors to your home using the form provided. Any visitors to your home must be aware of your isolation status.  If you plan to move to a new address or leave the county, notify the local health department in your county.  Call a doctor or seek care if you have an urgent medical need. Before seeking medical care, call ahead and get instructions from the provider before arriving at the medical office, clinic or hospital. Notify  them that you are being tested for the virus that causes COVID-19 so arrangements can be made, as necessary, to prevent transmission to others in the healthcare setting. Next, notify the local health department in your county.  If a medical emergency arises and you need to call 911, inform the first responders that you are being tested for the virus that causes COVID-19. Next, notify the local health department in your county.  Adhere to all guidance set forth by the [place] for [facility] of patients that is based on guidance from the Center for Disease Control and Prevention with suspected or confirmed COVID-19. It is provided with this guidance for Persons Under Investigation.  Your health and the health of our community are our top priorities. Public Health officials remain available to provide assistance and counseling to you about COVID-19 and compliance with this guidance.  Provider: ____________________________________________________________ Date: ______/_____/_________  By signing below, you acknowledge that you have read and agree to comply with this Guidance for Persons Under Investigation. ______________________________________________________________ Date: ______/_____/_________  WHO DO I CALL? You can find a list of local health departments here: [email or web address] Health Department: ____________________________________________________________________ Contact Name: ________________________________________________________________________ Telephone: ___________________________________________________________________________  Marice Potter, [place], Communicable Disease Branch COVID-19 Guidance for Persons Under Investigation March 15, 2018

## 2019-01-07 NOTE — TOC Initial Note (Signed)
Transition of Care [facility]) - Initial/Assessment Note    Patient Details  Name: Kylie Galloway MRN: [id number] Date of Birth: October 24, 1975  [facility] CM/SW Contact:    Benard Halsted, LCSW Phone Number: 01/07/2019, 12:00 PM  Clinical Narrative:                 [employer] delivering oxygen to patient's room for dc home by car. No other needs identified.   Expected Discharge Plan: Home/Self Care Barriers to Discharge: No Barriers Identified   Patient Goals and CMS Choice   CMS Medicare.gov Compare Post Acute Care list provided to:: Patient Choice offered to / list presented to : Patient  Expected Discharge Plan and Services Expected Discharge Plan: Home/Self Care   Discharge Planning Services: CM Consult Post Acute Care Choice: Durable Medical Equipment Living arrangements for the past 2 months: Single Family Home Expected Discharge Date: 01/07/19               DME Arranged: Oxygen DME Agency: [employer] Date DME Agency Contacted: 01/07/19 Time DME Agency Contacted: [id number] Representative spoke with at DME Agency: Thedore Mins            Prior Living Arrangements/Services Living arrangements for the past 2 months: [place]     Do you feel safe going back to the place where you live?: Yes               Activities of Daily Living Home Assistive Devices/Equipment: None ADL Screening (condition at time of admission) Patient's cognitive ability adequate to safely complete daily activities?: Yes Is the patient deaf or have difficulty hearing?: No Does the patient have difficulty seeing, even when wearing glasses/contacts?: No Does the patient have difficulty concentrating, remembering, or making decisions?: No Patient able to express need for assistance with ADLs?: Yes Does the patient have difficulty dressing or bathing?: No Independently performs ADLs?: Yes (appropriate for developmental age) Does the patient have difficulty walking or climbing stairs?:  No Weakness of Legs: None Weakness of Arms/Hands: None  Permission Sought/Granted Permission sought to share information with : Facility Art therapist granted to share information with : Yes, Verbal Permission Granted     Permission granted to share info w AGENCY: [employer]        Emotional Assessment Appearance:: Appears stated age Attitude/Demeanor/Rapport: Gracious Affect (typically observed): Accepting, Appropriate Orientation: : Oriented to Self, Oriented to Place, Oriented to  Time, Oriented to Situation Alcohol / Substance Use: Not Applicable Psych Involvement: No (comment)  Admission diagnosis:  Shortness of breath [R06.02] Hypoxia [R09.02] Pneumonia due to COVID-19 virus [U07.1, J12.89] Patient Active Problem List   Diagnosis Date Noted  . Pneumonia due to COVID-19 virus 01/02/2019  . Respiratory failure with hypoxia ([place]) 01/02/2019  . Ingrown toenail 10/26/2017  . Migraine 12/25/2015  . Hypertension 01/24/2012   PCP:  Darreld Mclean, MD Pharmacy:   [facility] [place], [place] [address] [place] [address] Phone: [phone] Fax: [phone]     Social Determinants of Health (SDOH) Interventions    Readmission Risk Interventions No flowsheet data found.

## 2019-01-08 ENCOUNTER — Encounter: Payer: Self-pay | Admitting: Family Medicine

## 2019-01-12 MED ORDER — BUTALBITAL-APAP-CAFFEINE 50-325-40 MG PO TABS: 1.0000 | ORAL_TABLET | Freq: Four times a day (QID) | ORAL | 0 refills | Status: DC | PRN

## 2019-01-12 NOTE — Addendum Note (Signed)
Addended by: Abbe Amsterdam C on: 01/12/2019 10:04 AM   Modules accepted: Orders

## 2019-01-14 LAB — GLUCOSE, CAPILLARY: Glucose-Capillary: 257 mg/dL — ABNORMAL HIGH (ref 70–99)

## 2019-01-18 NOTE — Progress Notes (Addendum)
[facility] [address], [address] [place], [place] [address] [phone]  Date:  01/19/2019   Name:  Kylie Galloway   DOB:  Oct 28, 1975   MRN:  [id number]  PCP:  Pearline Cables, MD    Chief Complaint: Hospitalization Follow-up (COVID) and Headache   History of Present Illness:  Kylie Galloway is a 44 y.o. very pleasant female patient who presents with the following:  Here today for periodic follow-up visit, and also discussed recent admission due to COVID-19 pneumonia She was admitted from 12/25-12/30-asked to quarantine until January 9 She originally tested positive for COVID-19 on December 19 Was admitted on the 25th with acute hypoxic respiratory failure due to viral pneumonia She was treated with IV Decadron and Remdesivir-discharged home with 5 more days of Decadron Blood pressure medications were held in the hospital, advised to restart HCTZ and bisoprolol on discharge  She notes that she feels ok except for a headache She does tend to have migraine but this seems like a different sort of headache She notes that she will have a HA that is very mild in the morning, gets worse as the day goes on She feels pressure in her head- it makes her feel off balance She does have history of vertigo-however she is not experiencing acute vertigo now She tried some fioricet but it did not help She stopped taking zinc supplementation as she felt like it made her worse   She has a productive cough in the am- brining up thick and dark discharge She notes some pressure in her nose and sinuses - only on the right side If she leans her head back she feel better  She tried going up on her amitriptyline but this has not helped so much   No vomiting The HA is not causing nausea No fever Her head sx are ok in the am but gradually get worse as the day goes by No trauma to her head  NKDA  Flu vaccine: Plans to do once she is fully  recovered Pap:  She is checking her oxygen sats at home- running 98- 100%  She is on OCP -we do not suspect pregnancy  I reviewed her radiology reports from recent admission CT Angio Chest PE W and/or Wo Contrast  Result Date: 12/31/2018 CLINICAL DATA:  Shortness of breath. COVID positive. EXAM: CT ANGIOGRAPHY CHEST WITH CONTRAST TECHNIQUE: Multidetector CT imaging of the chest was performed using the standard protocol during bolus administration of intravenous contrast. Multiplanar CT image reconstructions and MIPs were obtained to evaluate the vascular anatomy. CONTRAST:  OMNIPAQUE IOHEXOL 350 MG/ML SOLN COMPARISON:  One-view chest x-ray 12/31/2018 FINDINGS: Cardiovascular: The heart size is normal. The aortic arch and great vessels within normal limits. Pulmonary artery opacification is excellent. No focal filling defects are present to suggest pulmonary embolus. Pulmonary artery size is within normal limits. Mediastinum/Nodes: No significant mediastinal, hilar, or axillary adenopathy scratched at there is mild prominence of hilar lymphoid tissue bilaterally. Subcarinal lymph node is evident. No significant mediastinal or axillary adenopathy is present otherwise. Thoracic inlet is within normal limits. The esophagus is normal. Lungs/Pleura: Bilateral patchy lower lobe dependent airspace disease is present bilaterally. Less prominent patchy airspace disease is present the upper lobes bilaterally. Most prominent airspace disease is posteriorly in the right lower lobe. There is no pneumothorax or significant pleural effusion. Upper Abdomen: Diffuse fatty infiltration of the liver is noted. Musculoskeletal: The vertebral  body heights are maintained. Calcified disc present at C5-6. Central canal is otherwise widely patent. Review of the MIP images confirms the above findings. IMPRESSION: 1. No pulmonary embolus. 2. Bilateral patchy airspace disease compatible with multifocal pneumonia. 3. Hilar and  subcarinal lymphoid tissue is likely reactive. 4. Diffuse fatty infiltration of the liver. Electronically Signed   By: Marin Robertshristopher  Mattern [name]   On: 12/31/2018 17:37   DG Chest Port 1 View  Result Date: 01/04/2019 CLINICAL DATA:  COVID infection.  Worsening shortness of breath. EXAM: PORTABLE CHEST 1 VIEW COMPARISON:  01/02/2019 FINDINGS: 1112 hours. Low lung volumes. Patchy asymmetric ill-defined bilateral airspace disease noted with a mid and lower lung predominance. Opacity has progressed in the left mid lung peripherally. No pleural effusion. The cardiopericardial silhouette is within normal limits for size. The visualized bony structures of the thorax are intact. IMPRESSION: Mild progression of airspace disease in the left mid lung with bilateral patchy ill-defined airspace opacity compatible with multifocal pneumonia Electronically Signed   By: Kennith CenterEric  Mansell [name]   On: 01/04/2019 11:31   DG Chest Port 1 View  Result Date: 01/02/2019 CLINICAL DATA:  Shortness of breath.  COVID positive. EXAM: PORTABLE CHEST 1 VIEW COMPARISON:  12/31/2018 FINDINGS: 1227 hours. Low volumes. The cardiopericardial silhouette is within normal limits for size. Slight progression of patchy bilateral airspace disease seen in the mid and lower lungs predominantly. No associated pleural effusion. The visualized bony structures of the thorax are intact. Telemetry leads overlie the chest. IMPRESSION: Low volume film with slight interval progression of patchy bilateral airspace disease. Electronically Signed   By: Kennith CenterEric  Mansell [name]   On: 01/02/2019 12:44   DG Chest Portable 1 View  Result Date: 12/31/2018 CLINICAL DATA:  Chest pain and shortness of breath in a patient who tested positive for COVID-19 12/27/2018. EXAM: PORTABLE CHEST 1 VIEW COMPARISON:  PA and lateral chest 06/21/2008. FINDINGS: Patchy airspace disease in the left mid and lower lung zone and right lung base is identified. Lung volumes are somewhat low.  Heart size is normal. No pneumothorax or pleural effusion. IMPRESSION: Patchy basilar airspace disease is more notable on the left and has an appearance worrisome for pneumonia. Electronically Signed   By: Drusilla Kannerhomas  Dalessio [name]   On: 12/31/2018 12:29   CXR Portable same day  Result Date: 01/06/2019 CLINICAL DATA:  Hypoxia. EXAM: PORTABLE CHEST 1 VIEW COMPARISON:  January 04, 2019. FINDINGS: The heart size and mediastinal contours are within normal limits. Stable bilateral patchy airspace opacities are noted concerning for multifocal pneumonia. No pneumothorax or pleural effusion is noted. The visualized skeletal structures are unremarkable. IMPRESSION: Stable bilateral patchy airspace opacities are noted concerning for multifocal pneumonia. Electronically Signed   By: Lupita RaiderJames  Green Jr [name]   On: 01/06/2019 09:50     Patient Active Problem List   Diagnosis Date Noted  . Pneumonia due to COVID-19 virus 01/02/2019  . Respiratory failure with hypoxia (HCC) 01/02/2019  . Ingrown toenail 10/26/2017  . Migraine 12/25/2015  . Hypertension 01/24/2012    Past Medical History:  Diagnosis Date  . Allergy   . Anxiety   . H/O Bell's palsy    rt sided  . Hypertension   . Migraine aura, persistent     Past Surgical History:  Procedure Laterality Date  . CESAREAN SECTION  2010    Social History   Tobacco Use  . Smoking status: Never Smoker  . Smokeless tobacco: Never Used  Substance Use Topics  . Alcohol  use: No  . Drug use: No    Family History  Problem Relation Age of Onset  . CAD Paternal Grandfather 15  . CAD Paternal Grandmother 11  . Diabetes Maternal Grandmother   . Diabetes Maternal Grandfather   . Hypertension Brother     No Known Allergies  Medication list has been reviewed and updated.  Current Outpatient Medications on File Prior to Visit  Medication Sig Dispense Refill  . acetaminophen (TYLENOL) 500 MG tablet Take 1,000 mg by mouth every 6 (six) hours as needed  for mild pain or fever.    Marland Kitchen amitriptyline (ELAVIL) 50 MG tablet Take 1 tablet (50 mg total) by mouth at bedtime. 90 tablet 4  . Apple Cider Vinegar 500 MG TABS Take 500 mg by mouth daily.    . bisoprolol (ZEBETA) 5 MG tablet TAKE 1 TABLET BY MOUTH EVERY DAY (Patient taking differently: Take 5 mg by mouth daily. TAKE 1 TABLET BY MOUTH EVERY DAY) 90 tablet 3  . BLISOVI 24 FE 1-20 MG-MCG(24) tablet Take 1 tablet by mouth daily.    . butalbital-acetaminophen-caffeine (FIORICET) 50-325-40 MG tablet Take 1-2 tablets by mouth every 6 (six) hours as needed for headache. Max 6 per 24 hours 30 tablet 0  . cholecalciferol (VITAMIN D3) 25 MCG (1000 UT) tablet Take 1,000 Units by mouth daily.    Marland Kitchen dexamethasone (DECADRON) 6 MG tablet Take 1 tablet (6 mg total) by mouth daily. 5 tablet 0  . fluticasone (FLONASE) 50 MCG/ACT nasal spray Place 2 sprays into both nostrils daily as needed for congestion.    . hydrochlorothiazide (HYDRODIURIL) 50 MG tablet Take 1 tablet (50 mg total) by mouth daily. 90 tablet 3  . KLOR-CON M10 10 MEQ tablet TAKE 1 TABLET BY MOUTH EVERY DAY (Patient taking differently: Take 10 mEq by mouth daily. ) 90 tablet 1  . Omega-3 Fatty Acids (FISH OIL) 1000 MG CAPS Take 1,000 mg by mouth daily.     . phentermine (ADIPEX-P) 37.5 MG tablet Take 37.5 mg by mouth daily.  3  . polyethylene glycol (MIRALAX / GLYCOLAX) 17 g packet Take 17 g by mouth daily as needed for moderate constipation. 14 each 0  . rizatriptan (MAXALT-MLT) 10 MG disintegrating tablet Take 1 tablet (10 mg total) by mouth as needed for migraine. May repeat in 2 hours if needed 9 tablet 11  . topiramate (TOPAMAX) 50 MG tablet Take 1 tablet (50 mg total) by mouth 2 (two) times daily. 180 tablet 4  . vitamin B-12 (CYANOCOBALAMIN) 1000 MCG tablet Take 1,000 mcg by mouth daily.    . vitamin C (ASCORBIC ACID) 500 MG tablet Take 1,000 mg by mouth daily.     Marland Kitchen zinc sulfate 220 (50 Zn) MG capsule Take 1 capsule (220 mg total) by mouth  daily. 7 capsule 0   No current facility-administered medications on file prior to visit.    Review of Systems:  As per HPI- otherwise negative. She notes some pain in her teeth  Physical Examination: Vitals:   01/19/19 1554  BP: 132/84  Pulse: 91  Temp: (!) 97 F (36.1 C)  SpO2: 96%   Vitals:   01/19/19 1554  Weight: 194 lb 2 oz (88.1 kg)  Height: 5\' 5"  (1.651 m)   Body mass index is 32.3 kg/m. Ideal Body Weight: Weight in (lb) to have BMI = 25: 149.9  GEN: WDWN, NAD, Non-toxic, A & O x 3, overweight, looks well HEENT: Atraumatic, Normocephalic. Neck supple. No masses, No  LAD. Bilateral TM wnl, oropharynx normal.  PEERL,EOMI. she has inflammation of both nasal passages, with mucopurulent discharge present.  She has tenderness to her sinuses to palpation, especially on the right side No meningismus Ears and Nose: No external deformity. CV: RRR, No M/G/R. No JVD. No thrill. No extra heart sounds. PULM: CTA B, no wheezes, crackles, rhonchi. No retractions. No resp. distress. No accessory muscle use. ABD: S, NT, ND, +BS. No rebound. No HSM. EXTR: No c/c/e NEURO Normal gait.  No facial drooping, no speech slurring, no dysfunction of any limb PSYCH: Normally interactive. Conversant. Not depressed or anxious appearing.  Calm demeanor.   Results for orders placed or performed in visit on 01/19/19  Urine culture   Specimen: Urine  Result Value Ref Range   MICRO NUMBER: [id number]    SPECIMEN QUALITY: Adequate    Sample Source NOT GIVEN    STATUS: FINAL    ISOLATE 1: Escherichia coli (A)       Susceptibility   Escherichia coli - URINE CULTURE, REFLEX    AMOX/CLAVULANIC 8 Sensitive     AMPICILLIN >=32 Resistant     AMPICILLIN/SULBACTAM >=32 Resistant     CEFAZOLIN* <=4 Not Reportable      * For infections other than uncomplicated UTIcaused by E. coli, K. pneumoniae or P. mirabilis:Cefazolin is resistant if MIC > or = 8 mcg/mL.(Distinguishing susceptible versus  intermediatefor isolates with MIC < or = 4 mcg/mL requiresadditional testing.)For uncomplicated UTI caused by E. coli,K. pneumoniae or P. mirabilis: Cefazolin issusceptible if MIC <32 mcg/mL and predictssusceptible to the oral agents cefaclor, cefdinir,cefpodoxime, cefprozil, cefuroxime, cephalexinand loracarbef.    CEFEPIME <=1 Sensitive     CEFTRIAXONE <=1 Sensitive     CIPROFLOXACIN >=4 Resistant     LEVOFLOXACIN >=8 Resistant     ERTAPENEM <=0.5 Sensitive     GENTAMICIN <=1 Sensitive     IMIPENEM <=0.25 Sensitive     NITROFURANTOIN <=16 Sensitive     PIP/TAZO <=4 Sensitive     TOBRAMYCIN <=1 Sensitive     TRIMETH/SULFA* <=20 Sensitive      * For infections other than uncomplicated UTIcaused by E. coli, K. pneumoniae or P. mirabilis:Cefazolin is resistant if MIC > or = 8 mcg/mL.(Distinguishing susceptible versus intermediatefor isolates with MIC < or = 4 mcg/mL requiresadditional testing.)For uncomplicated UTI caused by E. coli,K. pneumoniae or P. mirabilis: Cefazolin issusceptible if MIC <32 mcg/mL and predictssusceptible to the oral agents cefaclor, cefdinir,cefpodoxime, cefprozil, cefuroxime, cephalexinand loracarbef.Legend:S = Susceptible  I = IntermediateR = Resistant  NS = Not susceptible* = Not tested  NR = Not reported**NN = See antimicrobic comments  CBC  Result Value Ref Range   WBC 9.4 4.0 - 10.5 K/uL   RBC 5.00 3.87 - 5.11 Mil/uL   Platelets 360.0 150.0 - 400.0 K/uL   Hemoglobin 13.4 12.0 - 15.0 g/dL   HCT 47.6 54.6 - 50.3 %   MCV 80.5 78.0 - 100.0 fl   MCHC 33.2 30.0 - 36.0 g/dL   RDW 54.6 56.8 - 12.7 %  Comprehensive metabolic panel  Result Value Ref Range   Sodium 143 135 - 145 mEq/L   Potassium 3.4 (L) 3.5 - 5.1 mEq/L   Chloride 106 96 - 112 mEq/L   CO2 27 19 - 32 mEq/L   Glucose, Bld 110 (H) 70 - 99 mg/dL   BUN 11 6 - 23 mg/dL   Creatinine, Ser 5.17 0.40 - 1.20 mg/dL   Total Bilirubin 0.4 0.2 - 1.2 mg/dL   Alkaline  Phosphatase 45 39 - 117 U/L   AST 15 0 - 37 U/L    ALT 19 0 - 35 U/L   Total Protein 6.8 6.0 - 8.3 g/dL   Albumin 4.0 3.5 - 5.2 g/dL   GFR 161.09106.03 >60.45>60.00 mL/min   Calcium 9.3 8.4 - 10.5 mg/dL  POCT urine pregnancy  Result Value Ref Range   Preg Test, Ur Negative Negative     Assessment and Plan: Hospital discharge follow-up - Plan: Urine culture, cefdinir (OMNICEF) 300 MG capsule, CBC, Comprehensive metabolic panel, DG Chest 2 View, POCT urine pregnancy  Cough - Plan: cefdinir (OMNICEF) 300 MG capsule  Sinus pain - Plan: cefdinir (OMNICEF) 300 MG capsule  Here today to follow-up from recent hospital discharge.  Lucindy has symptoms of possible sinus infection as well as bronchitis following her COVID-19 illness.  She is having a somewhat unusual headache for the last 10 days-she notes pressure and discomfort in her face, think she may have sinusitis.  She is not having the worst headache of life situation  We discussed her care in detail.  Would like to try a course of Omnicef to see if this may help both her chest and sinus symptoms.  If this does not improve her symptoms in the next couple of days, we will plan to proceed to a CT head.  Lillith agrees with this plan.  We got an hCG today in case a CT scan is necessary  Moderate medical decision making today  Signed Abbe AmsterdamJessica Dois Juarbe, MD   1/12, received her labs-message to patient  Results for orders placed or performed in visit on 01/19/19  Urine culture   Specimen: Urine  Result Value Ref Range   MICRO NUMBER: [id number]    SPECIMEN QUALITY: Adequate    Sample Source NOT GIVEN    STATUS: FINAL    ISOLATE 1: Escherichia coli (A)       Susceptibility   Escherichia coli - URINE CULTURE, REFLEX    AMOX/CLAVULANIC 8 Sensitive     AMPICILLIN >=32 Resistant     AMPICILLIN/SULBACTAM >=32 Resistant     CEFAZOLIN* <=4 Not Reportable      * For infections other than uncomplicated UTIcaused by E. coli, K. pneumoniae or P. mirabilis:Cefazolin is resistant if MIC > or = 8  mcg/mL.(Distinguishing susceptible versus intermediatefor isolates with MIC < or = 4 mcg/mL requiresadditional testing.)For uncomplicated UTI caused by E. coli,K. pneumoniae or P. mirabilis: Cefazolin issusceptible if MIC <32 mcg/mL and predictssusceptible to the oral agents cefaclor, cefdinir,cefpodoxime, cefprozil, cefuroxime, cephalexinand loracarbef.    CEFEPIME <=1 Sensitive     CEFTRIAXONE <=1 Sensitive     CIPROFLOXACIN >=4 Resistant     LEVOFLOXACIN >=8 Resistant     ERTAPENEM <=0.5 Sensitive     GENTAMICIN <=1 Sensitive     IMIPENEM <=0.25 Sensitive     NITROFURANTOIN <=16 Sensitive     PIP/TAZO <=4 Sensitive     TOBRAMYCIN <=1 Sensitive     TRIMETH/SULFA* <=20 Sensitive      * For infections other than uncomplicated UTIcaused by E. coli, K. pneumoniae or P. mirabilis:Cefazolin is resistant if MIC > or = 8 mcg/mL.(Distinguishing susceptible versus intermediatefor isolates with MIC < or = 4 mcg/mL requiresadditional testing.)For uncomplicated UTI caused by E. coli,K. pneumoniae or P. mirabilis: Cefazolin issusceptible if MIC <32 mcg/mL and predictssusceptible to the oral agents cefaclor, cefdinir,cefpodoxime, cefprozil, cefuroxime, cephalexinand loracarbef.Legend:S = Susceptible  I = IntermediateR = Resistant  NS = Not susceptible* = Not tested  NR = Not reported**NN = See antimicrobic comments  CBC  Result Value Ref Range   WBC 9.4 4.0 - 10.5 K/uL   RBC 5.00 3.87 - 5.11 Mil/uL   Platelets 360.0 150.0 - 400.0 K/uL   Hemoglobin 13.4 12.0 - 15.0 g/dL   HCT 40.3 36.0 - 46.0 %   MCV 80.5 78.0 - 100.0 fl   MCHC 33.2 30.0 - 36.0 g/dL   RDW 14.7 11.5 - 15.5 %  Comprehensive metabolic panel  Result Value Ref Range   Sodium 143 135 - 145 mEq/L   Potassium 3.4 (L) 3.5 - 5.1 mEq/L   Chloride 106 96 - 112 mEq/L   CO2 27 19 - 32 mEq/L   Glucose, Bld 110 (H) 70 - 99 mg/dL   BUN 11 6 - 23 mg/dL   Creatinine, Ser 0.62 0.40 - 1.20 mg/dL   Total Bilirubin 0.4 0.2 - 1.2 mg/dL   Alkaline  Phosphatase 45 39 - 117 U/L   AST 15 0 - 37 U/L   ALT 19 0 - 35 U/L   Total Protein 6.8 6.0 - 8.3 g/dL   Albumin 4.0 3.5 - 5.2 g/dL   GFR 106.03 >60.00 mL/min   Calcium 9.3 8.4 - 10.5 mg/dL  POCT urine pregnancy  Result Value Ref Range   Preg Test, Ur Negative Negative   Received her urine culture 1/13- message to pt

## 2019-01-19 ENCOUNTER — Other Ambulatory Visit: Payer: Self-pay

## 2019-01-19 ENCOUNTER — Encounter: Payer: Self-pay | Admitting: Family Medicine

## 2019-01-19 ENCOUNTER — Ambulatory Visit (INDEPENDENT_AMBULATORY_CARE_PROVIDER_SITE_OTHER): Payer: BC Managed Care – PPO | Admitting: Family Medicine

## 2019-01-19 VITALS — BP 132/84 | HR 91 | Temp 97.0°F | Ht 65.0 in | Wt 194.1 lb

## 2019-01-19 DIAGNOSIS — N3000 Acute cystitis without hematuria: Secondary | ICD-10-CM | POA: Diagnosis not present

## 2019-01-19 DIAGNOSIS — R05 Cough: Secondary | ICD-10-CM

## 2019-01-19 DIAGNOSIS — Z09 Encounter for follow-up examination after completed treatment for conditions other than malignant neoplasm: Secondary | ICD-10-CM | POA: Diagnosis not present

## 2019-01-19 DIAGNOSIS — J3489 Other specified disorders of nose and nasal sinuses: Secondary | ICD-10-CM | POA: Diagnosis not present

## 2019-01-19 DIAGNOSIS — R059 Cough, unspecified: Secondary | ICD-10-CM

## 2019-01-19 LAB — POCT URINE PREGNANCY: Preg Test, Ur: NEGATIVE

## 2019-01-19 MED ORDER — CEFDINIR 300 MG PO CAPS: 300.0000 mg | ORAL_CAPSULE | Freq: Two times a day (BID) | ORAL | 0 refills | Status: DC

## 2019-01-19 NOTE — Patient Instructions (Signed)
Great to see you today- I am glad you are gradually getting better Please go to the lab We will start you on omnicef for possible sinus infection/ chest infection If your head is not improving in the next 2-3 days we will order a CT- sooner if you are getting worse

## 2019-01-20 ENCOUNTER — Encounter: Payer: Self-pay | Admitting: Family Medicine

## 2019-01-20 LAB — COMPREHENSIVE METABOLIC PANEL
ALT: 19 U/L (ref 0–35)
AST: 15 U/L (ref 0–37)
Albumin: 4 g/dL (ref 3.5–5.2)
Alkaline Phosphatase: 45 U/L (ref 39–117)
BUN: 11 mg/dL (ref 6–23)
CO2: 27 mEq/L (ref 19–32)
Calcium: 9.3 mg/dL (ref 8.4–10.5)
Chloride: 106 mEq/L (ref 96–112)
Creatinine, Ser: 0.62 mg/dL (ref 0.40–1.20)
GFR: 106.03 mL/min (ref >60.00)
Glucose, Bld: 110 mg/dL — ABNORMAL HIGH (ref 70–99)
Potassium: 3.4 mEq/L — ABNORMAL LOW (ref 3.5–5.1)
Sodium: 143 mEq/L (ref 135–145)
Total Bilirubin: 0.4 mg/dL (ref 0.2–1.2)
Total Protein: 6.8 g/dL (ref 6.0–8.3)

## 2019-01-20 LAB — CBC
HCT: 40.3 % (ref 36.0–46.0)
Hemoglobin: 13.4 g/dL (ref 12.0–15.0)
MCHC: 33.2 g/dL (ref 30.0–36.0)
MCV: 80.5 fl (ref 78.0–100.0)
Platelets: 360 10*3/uL (ref 150.0–400.0)
RBC: 5 Mil/uL (ref 3.87–5.11)
RDW: 14.7 % (ref 11.5–15.5)
WBC: 9.4 10*3/uL (ref 4.0–10.5)

## 2019-01-21 ENCOUNTER — Encounter: Payer: Self-pay | Admitting: Family Medicine

## 2019-01-21 DIAGNOSIS — R519 Headache, unspecified: Secondary | ICD-10-CM

## 2019-01-21 LAB — URINE CULTURE
MICRO NUMBER:: 10028047
SPECIMEN QUALITY:: ADEQUATE

## 2019-01-23 ENCOUNTER — Encounter: Payer: Self-pay | Admitting: Family Medicine

## 2019-01-23 ENCOUNTER — Ambulatory Visit (HOSPITAL_BASED_OUTPATIENT_CLINIC_OR_DEPARTMENT_OTHER)
Admission: RE | Admit: 2019-01-23 | Discharge: 2019-01-23 | Disposition: A | Discharge status: Home / Self Care | Payer: BC Managed Care – PPO | Source: Ambulatory Visit | Attending: Family Medicine | Admitting: Family Medicine

## 2019-01-23 ENCOUNTER — Other Ambulatory Visit: Payer: Self-pay

## 2019-01-23 DIAGNOSIS — R519 Headache, unspecified: Secondary | ICD-10-CM | POA: Insufficient documentation

## 2019-01-30 ENCOUNTER — Encounter: Payer: Self-pay | Admitting: Family Medicine

## 2019-02-16 ENCOUNTER — Other Ambulatory Visit: Payer: Self-pay | Admitting: Family Medicine

## 2019-02-16 DIAGNOSIS — I10 Essential (primary) hypertension: Secondary | ICD-10-CM

## 2019-03-03 ENCOUNTER — Encounter: Payer: Self-pay | Admitting: Family Medicine

## 2019-03-10 ENCOUNTER — Encounter: Payer: Self-pay | Admitting: Family Medicine

## 2019-03-11 ENCOUNTER — Other Ambulatory Visit: Payer: Self-pay | Admitting: Family Medicine

## 2019-03-11 DIAGNOSIS — E876 Hypokalemia: Secondary | ICD-10-CM

## 2019-03-14 ENCOUNTER — Ambulatory Visit: Payer: BC Managed Care – PPO | Attending: Internal Medicine

## 2019-03-14 DIAGNOSIS — Z23 Encounter for immunization: Secondary | ICD-10-CM | POA: Insufficient documentation

## 2019-03-14 NOTE — Progress Notes (Signed)
   Covid-19 Vaccination Clinic  Name:  Kylie Galloway    MRN: [id number] DOB: September 25, 1975  03/14/2019  Kylie Galloway was observed post Covid-19 immunization for 15 minutes without incident. She was provided with Vaccine Information Sheet and instruction to access the V-Safe system.   Kylie Galloway was instructed to call 911 with any severe reactions post vaccine: Marland Kitchen Difficulty breathing  . Swelling of face and throat  . A fast heartbeat  . A bad rash all over body  . Dizziness and weakness   Immunizations Administered    Name Date Dose VIS Date Route   Pfizer COVID-19 Vaccine 03/14/2019  4:07 PM 0.3 mL 12/19/2018 Intramuscular   Manufacturer: [employer], [employer]   Lot: [id number]   NDC: [address]-6

## 2019-03-31 ENCOUNTER — Encounter: Payer: Self-pay | Admitting: Family Medicine

## 2019-04-04 ENCOUNTER — Ambulatory Visit: Payer: BC Managed Care – PPO | Attending: Internal Medicine

## 2019-04-04 DIAGNOSIS — Z23 Encounter for immunization: Secondary | ICD-10-CM

## 2019-04-04 NOTE — Progress Notes (Signed)
   Covid-19 Vaccination Clinic  Name:  BRITTINEE RISK    MRN: [id number] DOB: 1975-11-09  04/04/2019  Ms. Caponigro was observed post Covid-19 immunization for 15 minutes without incident. She was provided with Vaccine Information Sheet and instruction to access the V-Safe system.   Ms. Beauchesne was instructed to call 911 with any severe reactions post vaccine: Marland Kitchen Difficulty breathing  . Swelling of face and throat  . A fast heartbeat  . A bad rash all over body  . Dizziness and weakness   Immunizations Administered    Name Date Dose VIS Date Route   Pfizer COVID-19 Vaccine 04/04/2019  3:57 PM 0.3 mL 12/19/2018 Intramuscular   Manufacturer: [employer], [employer]   Lot: [id number]   NDC: [address]-3

## 2019-04-06 ENCOUNTER — Other Ambulatory Visit: Payer: Self-pay

## 2019-04-06 ENCOUNTER — Encounter: Payer: Self-pay | Admitting: Family Medicine

## 2019-04-06 ENCOUNTER — Ambulatory Visit: Payer: BC Managed Care – PPO | Admitting: Family Medicine

## 2019-04-06 VITALS — BP 138/78 | HR 92 | Temp 97.0°F | Ht 65.0 in | Wt 200.0 lb

## 2019-04-06 DIAGNOSIS — G43109 Migraine with aura, not intractable, without status migrainosus: Secondary | ICD-10-CM | POA: Diagnosis not present

## 2019-04-06 DIAGNOSIS — G43909 Migraine, unspecified, not intractable, without status migrainosus: Secondary | ICD-10-CM

## 2019-04-06 DIAGNOSIS — R200 Anesthesia of skin: Secondary | ICD-10-CM | POA: Diagnosis not present

## 2019-04-06 MED ORDER — AMITRIPTYLINE HCL 50 MG PO TABS: 50.0000 mg | ORAL_TABLET | Freq: Every day | ORAL | 4 refills | Status: DC

## 2019-04-06 MED ORDER — AJOVY 225 MG/1.5ML ~~LOC~~ SOAJ: 225.0000 mg | SUBCUTANEOUS | 3 refills | Status: DC

## 2019-04-06 NOTE — Progress Notes (Signed)
I reviewed note and agree with plan.   Kylie Vinzant R. Elayna Tobler, MD 04/06/2019, 5:54 PM Certified in Neurology, Neurophysiology and Neuroimaging  [facility] [address], [address] [place], [place] [address] [phone]  

## 2019-04-06 NOTE — Patient Instructions (Signed)
Continue amitriptyline 50mg  daily, continue topiramate 50mg  twice daily. We will add Avovy injections every month. Continue rizatriptan as needed   Follow up with ortho for carpal tunnel, wear braces as directed  Stay well hydrated, work on healthy well balanced diet and try to exercise regularly  Follow up with me in 6 months  Fremanezumab injection What is this medicine? FREMANEZUMAB (fre ma NEZ ue mab) is used to prevent migraine headaches. This medicine may be used for other purposes; ask your health care provider or pharmacist if you have questions. COMMON BRAND NAME(S): AJOVY What should I tell my health care provider before I take this medicine? They need to know if you have any of these conditions:  an unusual or allergic reaction to fremanezumab, other medicines, foods, dyes, or preservatives  pregnant or trying to get pregnant  breast-feeding How should I use this medicine? This medicine is for injection under the skin. You will be taught how to prepare and give this medicine. Use exactly as directed. Take your medicine at regular intervals. Do not take your medicine more often than directed. It is important that you put your used needles and syringes in a special sharps container. Do not put them in a trash can. If you do not have a sharps container, call your pharmacist or healthcare provider to get one. Talk to your pediatrician regarding the use of this medicine in children. Special care may be needed. Overdosage: If you think you have taken too much of this medicine contact a poison control center or emergency room at once. NOTE: This medicine is only for you. Do not share this medicine with others. What if I miss a dose? If you miss a dose, take it as soon as you can. If it is almost time for your next dose, take only that dose. Do not take double or extra doses. What may interact with this medicine? Interactions are not expected. This list may not describe all  possible interactions. Give your health care provider a list of all the medicines, herbs, non-prescription drugs, or dietary supplements you use. Also tell them if you smoke, drink alcohol, or use illegal drugs. Some items may interact with your medicine. What should I watch for while using this medicine? Tell your doctor or healthcare professional if your symptoms do not start to get better or if they get worse. What side effects may I notice from receiving this medicine? Side effects that you should report to your doctor or health care professional as soon as possible:  allergic reactions like skin rash, itching or hives, swelling of the face, lips, or tongue Side effects that usually do not require medical attention (report these to your doctor or health care professional if they continue or are bothersome):  pain, redness, or irritation at site where injected This list may not describe all possible side effects. Call your doctor for medical advice about side effects. You may report side effects to FDA at 1-800-FDA-1088. Where should I keep my medicine? Keep out of the reach of children. You will be instructed on how to store this medicine. Throw away any unused medicine after the expiration date on the label. NOTE: This sheet is a summary. It may not cover all possible information. If you have questions about this medicine, talk to your doctor, pharmacist, or health care provider.  2020 Elsevier/Gold Standard (2016-09-24 17:22:56)   Migraine Headache A migraine headache is a very strong throbbing pain on one side or both sides of  your head. This type of headache can also cause other symptoms. It can last from 4 hours to 3 days. Talk with your doctor about what things may bring on (trigger) this condition. What are the causes? The exact cause of this condition is not known. This condition may be triggered or caused by:  Drinking alcohol.  Smoking.  Taking medicines, such  as: ? Medicine used to treat chest pain (nitroglycerin). ? Birth control pills. ? Estrogen. ? Some blood pressure medicines.  Eating or drinking certain products.  Doing physical activity. Other things that may trigger a migraine headache include:  Having a menstrual period.  Pregnancy.  Hunger.  Stress.  Not getting enough sleep or getting too much sleep.  Weather changes.  Tiredness (fatigue). What increases the risk?  Being 32-23 years old.  Being female.  Having a family history of migraine headaches.  Being Caucasian.  Having depression or anxiety.  Being very overweight. What are the signs or symptoms?  A throbbing pain. This pain may: ? Happen in any area of the head, such as on one side or both sides. ? Make it hard to do daily activities. ? Get worse with physical activity. ? Get worse around bright lights or loud noises.  Other symptoms may include: ? Feeling sick to your stomach (nauseous). ? Vomiting. ? Dizziness. ? Being sensitive to bright lights, loud noises, or smells.  Before you get a migraine headache, you may get warning signs (an aura). An aura may include: ? Seeing flashing lights or having blind spots. ? Seeing bright spots, halos, or zigzag lines. ? Having tunnel vision or blurred vision. ? Having numbness or a tingling feeling. ? Having trouble talking. ? Having weak muscles.  Some people have symptoms after a migraine headache (postdromal phase), such as: ? Tiredness. ? Trouble thinking (concentrating). How is this treated?  Taking medicines that: ? Relieve pain. ? Relieve the feeling of being sick to your stomach. ? Prevent migraine headaches.  Treatment may also include: ? Having acupuncture. ? Avoiding foods that bring on migraine headaches. ? Learning ways to control your body functions (biofeedback). ? Therapy to help you know and deal with negative thoughts (cognitive behavioral therapy). Follow these  instructions at home: Medicines  Take over-the-counter and prescription medicines only as told by your doctor.  Ask your doctor if the medicine prescribed to you: ? Requires you to avoid driving or using heavy machinery. ? Can cause trouble pooping (constipation). You may need to take these steps to prevent or treat trouble pooping:  Drink enough fluid to keep your pee (urine) pale yellow.  Take over-the-counter or prescription medicines.  Eat foods that are high in fiber. These include beans, whole grains, and fresh fruits and vegetables.  Limit foods that are high in fat and sugar. These include fried or sweet foods. Lifestyle  Do not drink alcohol.  Do not use any products that contain nicotine or tobacco, such as cigarettes, e-cigarettes, and chewing tobacco. If you need help quitting, ask your doctor.  Get at least 8 hours of sleep every night.  Limit and deal with stress. General instructions      Keep a journal to find out what may bring on your migraine headaches. For example, write down: ? What you eat and drink. ? How much sleep you get. ? Any change in what you eat or drink. ? Any change in your medicines.  If you have a migraine headache: ? Avoid things that make your  symptoms worse, such as bright lights. ? It may help to lie down in a dark, quiet room. ? Do not drive or use heavy machinery. ? Ask your doctor what activities are safe for you.  Keep all follow-up visits as told by your doctor. This is important. Contact a doctor if:  You get a migraine headache that is different or worse than others you have had.  You have more than 15 headache days in one month. Get help right away if:  Your migraine headache gets very bad.  Your migraine headache lasts longer than 72 hours.  You have a fever.  You have a stiff neck.  You have trouble seeing.  Your muscles feel weak or like you cannot control them.  You start to lose your balance a  lot.  You start to have trouble walking.  You pass out (faint).  You have a seizure. Summary  A migraine headache is a very strong throbbing pain on one side or both sides of your head. These headaches can also cause other symptoms.  This condition may be treated with medicines and changes to your lifestyle.  Keep a journal to find out what may bring on your migraine headaches.  Contact a doctor if you get a migraine headache that is different or worse than others you have had.  Contact your doctor if you have more than 15 headache days in a month. This information is not intended to replace advice given to you by your health care provider. Make sure you discuss any questions you have with your health care provider. Document Revised: 04/18/2018 Document Reviewed: 02/06/2018 Elsevier Patient Education  2020 [employer].

## 2019-04-06 NOTE — Progress Notes (Signed)
PATIENT: Kylie Galloway DOB: 23-Nov-1975  REASON FOR VISIT: follow up HISTORY FROM: patient  Chief Complaint  Patient presents with  . Follow-up    pt alone, rm 1. pt states that she was doing well and then developed covid in dec and since then she has been having migraines very frequently.      HISTORY OF PRESENT ILLNESS: Today 04/06/19 Kylie Galloway is a 44 y.o. female here today for follow up for migraines. She continues amitriptyline 50mg  at bedtime and topiramate 50mg  BID. She was diagnosed with Covid in December and hospitalized for 7 days with pneumonia. She reports that headaches have worsened since. She is having near daily headaches with 10-12 migrainous days each month. She does have black spots in vision prior to headaches. She has used rizatriptan with some relief but this makes her sleepy.   She is planning to see ortho in April for carpal tunnel. She is wearing wrist braces without much benefit.    HISTORY: (copied from Dr Gladstone Lighter note on 10/06/2018)  UPDATE (10/06/18, VRP): Since last visit, doing well. Symptoms are stable. Severity is mild. No alleviating or aggravating factors. Tolerating meds. Only 1-2 HA per month. Weather and allergies can aggravate HA.   UPDATE (09/30/17, VRP): Since last visit, doing well. Symptoms are mild. Severity is mild. No alleviating or aggravating factors. Tolerating meds. Avg 1 HA per month in general. Increasing numbness in hands and feet.  UPDATE (03/25/17, VRP): Since last visit, was doing well until 3-4 months ago; now increasing HA 10-12 per month. Tolerating meds. No alleviating or aggravating factors. Sleep is suboptimal (5-6 hours per night or less).    UPDATE 06/23/15: Since last visit, doing well. HA are reduced. Only needed triptan 3-4 times in last 6 months. Tolerating TPX.   UPDATE 12/16/14: Since last visit 5-8 migraines, likely weather related. No other issues. Overall doing well.  UPDATE 08/10/14: Since last visit,  doing better. Only 3 days of migraine since last visit. Tolerating TPX. Rizatriptan also working well. Overall doing well. MRI brain was normal. Still no specific triggers. Incidentally patient developed small rash on right perioral region 1 week ago, which started out as "cold sore". Patient denies any burning, numbness or pain with this rash. Rash seems to be stabilized and no longer expanding.  PRIOR HPI (06/25/14): 44 year old right-handed female here for evaluation of headaches, dizziness, visual changes. Since 2010 patient has had intermittent episodes of headache, frontal, occipital, sometimes unilateral pressure and throbbing sensation with nausea, photophobia, proceeded by seeing spots and vision changes. She was having 1 or less episodes per month. Gradually these increased uptake 3-4 per month. In 2016 she has had 5 episodes. Recent episodes have changed to include severe dizzy, spinning sensation, lasting much longer than previously. Prior episodes used the last 1-2 hours at a time. Now episodes can last 5 hours up to 2-3 days at a time. No prior diagnosis of migraine. Patient's daughter has some headaches but not officially diagnosed. Patient denies any specific triggering or aggravating factors. No food, caffeine, hormonal, sleep or stress triggers. Patient has tried Motrin, BC powder, Tylenol, Aleve, Excedrin migraine in the past for headache treatment, with mild relief. Patient has remote history of Bell's palsy on the right side 2003 with incomplete recovery. Patient has had intermittent bilateral hand numbness in the past.   REVIEW OF SYSTEMS: Out of a complete 14 system review of symptoms, the patient complains only of the following symptoms, headaches, numbness of  hands and all other reviewed systems are negative.  ALLERGIES: No Known Allergies  HOME MEDICATIONS: Outpatient Medications Prior to Visit  Medication Sig Dispense Refill  . acetaminophen (TYLENOL) 500 MG tablet Take 1,000  mg by mouth every 6 (six) hours as needed for mild pain or fever.    Marland Kitchen Apple Cider Vinegar 500 MG TABS Take 500 mg by mouth daily.    . bisoprolol (ZEBETA) 5 MG tablet TAKE 1 TABLET BY MOUTH EVERY DAY (Patient taking differently: Take 5 mg by mouth daily. TAKE 1 TABLET BY MOUTH EVERY DAY) 90 tablet 3  . BLISOVI 24 FE 1-20 MG-MCG(24) tablet Take 1 tablet by mouth daily.    . cholecalciferol (VITAMIN D3) 25 MCG (1000 UT) tablet Take 1,000 Units by mouth daily.    . fluticasone (FLONASE) 50 MCG/ACT nasal spray Place 2 sprays into both nostrils daily as needed for congestion.    . hydrochlorothiazide (HYDRODIURIL) 50 MG tablet TAKE 1 TABLET BY MOUTH EVERY DAY 90 tablet 3  . KLOR-CON M10 10 MEQ tablet TAKE 1 TABLET BY MOUTH EVERY DAY 90 tablet 1  . Omega-3 Fatty Acids (FISH OIL) 1000 MG CAPS Take 1,000 mg by mouth daily.     . phentermine (ADIPEX-P) 37.5 MG tablet Take 37.5 mg by mouth daily.  3  . polyethylene glycol (MIRALAX / GLYCOLAX) 17 g packet Take 17 g by mouth daily as needed for moderate constipation. 14 each 0  . rizatriptan (MAXALT-MLT) 10 MG disintegrating tablet Take 1 tablet (10 mg total) by mouth as needed for migraine. May repeat in 2 hours if needed 9 tablet 11  . topiramate (TOPAMAX) 50 MG tablet Take 1 tablet (50 mg total) by mouth 2 (two) times daily. 180 tablet 4  . vitamin B-12 (CYANOCOBALAMIN) 1000 MCG tablet Take 1,000 mcg by mouth daily.    . vitamin C (ASCORBIC ACID) 500 MG tablet Take 1,000 mg by mouth daily.     Marland Kitchen amitriptyline (ELAVIL) 50 MG tablet Take 1 tablet (50 mg total) by mouth at bedtime. (Patient taking differently: Take 75 mg by mouth at bedtime. ) 90 tablet 4  . butalbital-acetaminophen-caffeine (FIORICET) 50-325-40 MG tablet Take 1-2 tablets by mouth every 6 (six) hours as needed for headache. Max 6 per 24 hours 30 tablet 0  . cefdinir (OMNICEF) 300 MG capsule Take 1 capsule (300 mg total) by mouth 2 (two) times daily. 20 capsule 0  . dexamethasone (DECADRON) 6  MG tablet Take 1 tablet (6 mg total) by mouth daily. 5 tablet 0  . zinc sulfate 220 (50 Zn) MG capsule Take 1 capsule (220 mg total) by mouth daily. 7 capsule 0   No facility-administered medications prior to visit.    PAST MEDICAL HISTORY: Past Medical History:  Diagnosis Date  . Allergy   . Anxiety   . H/O Bell's palsy    rt sided  . Hypertension   . Migraine aura, persistent     PAST SURGICAL HISTORY: Past Surgical History:  Procedure Laterality Date  . CESAREAN SECTION  2010    FAMILY HISTORY: Family History  Problem Relation Age of Onset  . CAD Paternal Grandfather 72  . CAD Paternal Grandmother 61  . Diabetes Maternal Grandmother   . Diabetes Maternal Grandfather   . Hypertension Brother     SOCIAL HISTORY: Social History   Socioeconomic History  . Marital status: Married    Spouse name: Thayer Ohm  . Number of children: 3  . Years of education: 63  .  Highest education level: Not on file  Occupational History  . Not on file  Tobacco Use  . Smoking status: Never Smoker  . Smokeless tobacco: Never Used  Substance and Sexual Activity  . Alcohol use: No  . Drug use: No  . Sexual activity: Yes    Comment: married  Other Topics Concern  . Not on file  Social History Narrative   Works as a Scientist, research (medical).  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     Social Determinants of Health   Financial Resource Strain:   . Difficulty of Paying Living Expenses:   Food Insecurity:   . Worried About Programme researcher, broadcasting/film/video in the Last Year:   . Barista in the Last Year:   Transportation Needs:   . Freight forwarder (Medical):   Marland Kitchen Lack of Transportation (Non-Medical):   Physical Activity:   . Days of Exercise per Week:   . Minutes of Exercise per Session:   Stress:   . Feeling of Stress :   Social Connections:   . Frequency of Communication with Friends and Family:   . Frequency of Social Gatherings with Friends and Family:   . Attends Religious  Services:   . Active Member of Clubs or Organizations:   . Attends Banker Meetings:   Marland Kitchen Marital Status:   Intimate Partner Violence:   . Fear of Current or Ex-Partner:   . Emotionally Abused:   Marland Kitchen Physically Abused:   . Sexually Abused:       PHYSICAL EXAM  Vitals:   04/06/19 1531  BP: 138/78  Pulse: 92  Temp: (!) 97 F (36.1 C)  Weight: 200 lb (90.7 kg)  Height: 5\' 5"  (1.651 m)   Body mass index is 33.28 kg/m.  Generalized: Well developed, in no acute distress  Cardiology: normal rate and rhythm, no murmur noted Respiratory: Clear to auscultation bilaterally Neurological examination  Mentation: Alert oriented to time, place, history taking. Follows all commands speech and language fluent Cranial nerve II-XII: Pupils were equal round reactive to light. Extraocular movements were full, visual field were full on confrontational test. Facial sensation and strength were normal. Head turning and shoulder shrug  were normal and symmetric. Motor: The motor testing reveals 5 over 5 strength of all 4 extremities. Good symmetric motor tone is noted throughout.  Sensory: Sensory testing is intact to soft touch on all 4 extremities. No evidence of extinction is noted.  Coordination: Cerebellar testing reveals good finger-nose-finger and heel-to-shin bilaterally.  Gait and station: Gait is normal.   DIAGNOSTIC DATA (LABS, IMAGING, TESTING) - I reviewed patient records, labs, notes, testing and imaging myself where available.  No flowsheet data found.   Lab Results  Component Value Date   WBC 9.4 01/19/2019   HGB 13.4 01/19/2019   HCT 40.3 01/19/2019   MCV 80.5 01/19/2019   PLT 360.0 01/19/2019      Component Value Date/Time   NA 143 01/19/2019 1629   K 3.4 (L) 01/19/2019 1629   CL 106 01/19/2019 1629   CO2 27 01/19/2019 1629   GLUCOSE 110 (H) 01/19/2019 1629   BUN 11 01/19/2019 1629   CREATININE 0.62 01/19/2019 1629   CREATININE 0.61 04/26/2014 1414    CALCIUM 9.3 01/19/2019 1629   PROT 6.8 01/19/2019 1629   ALBUMIN 4.0 01/19/2019 1629   AST 15 01/19/2019 1629   ALT 19 01/19/2019 1629   ALKPHOS 45 01/19/2019 1629   BILITOT 0.4 01/19/2019 1629  GFRNONAA >60 01/07/2019 0441   GFRAA >60 01/07/2019 0441   Lab Results  Component Value Date   CHOL 140 06/25/2018   HDL 35.10 (L) 06/25/2018   LDLCALC 70 06/25/2018   TRIG 233 (H) 01/02/2019   CHOLHDL 4 06/25/2018   Lab Results  Component Value Date   HGBA1C 6.1 06/25/2018   No results found for: VITAMINB12 Lab Results  Component Value Date   TSH 2.06 06/10/2017       ASSESSMENT AND PLAN 44 y.o. year old female  has a past medical history of Allergy, Anxiety, H/O Bell's palsy, Hypertension, and Migraine aura, persistent. here with     ICD-10-CM   1. Migraine with aura and without status migrainosus, not intractable  G43.109   2. Bilateral hand numbness  R20.0   3. Migraine without status migrainosus, not intractable, unspecified migraine type  G43.909 amitriptyline (ELAVIL) 50 MG tablet    Kylie Galloway has noted an increase in intensity and frequency of headaches with migrainous symptoms since being diagnosed with Covid in December.  She is tolerating topiramate and amitriptyline.  We will continue topiramate 50 mg twice daily and reduce amitriptyline dose back to 50 mg at bedtime.  We will add Ajovy injections every 30 days.  She was educated on appropriate administration and storage of this medication.  Side effects discussed.  Additional information provided in AVS.  She will continue rizatriptan as needed for abortive therapy.  She was advised to stay well-hydrated, work on a well-balanced diet and try to exercise regularly.  She will follow-up closely with primary care.  She will see Ortho next month for concerns of carpal tunnel.  She will follow-up with me in 6 months, sooner if needed.  She verbalizes understanding and agreement with this plan.   No orders of the defined types  were placed in this encounter.    Meds ordered this encounter  Medications  . Fremanezumab-vfrm (AJOVY) 225 MG/1.5ML SOAJ    Sig: Inject 225 mg into the skin every 30 (thirty) days.    Dispense:  3 pen    Refill:  3    Order Specific Question:   Supervising Provider    Answer:   Anson Fret [id number]  . amitriptyline (ELAVIL) 50 MG tablet    Sig: Take 1 tablet (50 mg total) by mouth at bedtime.    Dispense:  90 tablet    Refill:  4    Order Specific Question:   Supervising Provider    Answer:   Anson Fret [id number]      I spent 15 minutes with the patient. 50% of this time was spent counseling and educating patient on plan of care and medications.    Shawnie Dapper, FNP-C 04/06/2019, 4:10 PM [facility] [address], [address] [place], [place] [address] [phone]

## 2019-04-09 ENCOUNTER — Ambulatory Visit (HOSPITAL_BASED_OUTPATIENT_CLINIC_OR_DEPARTMENT_OTHER)
Admission: RE | Admit: 2019-04-09 | Discharge: 2019-04-09 | Disposition: A | Discharge status: Home / Self Care | Payer: BC Managed Care – PPO | Source: Ambulatory Visit | Attending: Family Medicine | Admitting: Family Medicine

## 2019-04-09 ENCOUNTER — Other Ambulatory Visit: Payer: Self-pay

## 2019-04-09 DIAGNOSIS — Z09 Encounter for follow-up examination after completed treatment for conditions other than malignant neoplasm: Secondary | ICD-10-CM | POA: Diagnosis not present

## 2019-04-29 ENCOUNTER — Encounter: Payer: Self-pay | Admitting: Family Medicine

## 2019-05-10 NOTE — Progress Notes (Addendum)
[facility] [address] [place], [place] [address] [phone]  Date:  05/11/2019   Name:  Kylie Galloway   DOB:  1975/12/13   MRN:  [id number]  PCP:  Pearline Cables, MD    Chief Complaint: Loss of Hair   History of Present Illness:  Kylie Galloway is a 44 y.o. very pleasant female patient who presents with the following:  Forrester woman with history of hypertension and migraine headaches She also had a bout with COVID-19 over the holidays, she was admitted for 5 days in December with pneumonia and respiratory failure/hypoxia.  She received IV remdesivir and Decadron  During her hospital follow-up, she was having more severe headaches.  We got a CT of her head on January 15 which was normal She also saw her neurology provider, Shawnie Dapper, NP at the end of March-she is being treated with Topamax and amitriptyline, and is started Ajovy injections- this is working for her at least somewhat She just got her 2nd Ajovy injection She still does use her inhaler a couple of times a week, but her breathing is basically back to normal   Pap- will be done per her GYN  COVID-19 series is complete Can do routine labs today She has not done a mammogram yet but plans to do so soon   She continues to note hair loss-this is her main concern today This has been present for 2.5 months She will lose a lot of hair when she brushes her hair  Her hairdresser has noted that her hair is quite thin, especially around the hairline.  She does not have any distinct bald spots   Patient Active Problem List   Diagnosis Date Noted  . Pneumonia due to COVID-19 virus 01/02/2019  . Respiratory failure with hypoxia (HCC) 01/02/2019  . Ingrown toenail 10/26/2017  . Migraine 12/25/2015  . Hypertension 01/24/2012    Past Medical History:  Diagnosis Date  . Allergy   . Anxiety   . H/O Bell's palsy    rt sided  . Hypertension   . Migraine aura,  persistent     Past Surgical History:  Procedure Laterality Date  . CESAREAN SECTION  2010    Social History   Tobacco Use  . Smoking status: Never Smoker  . Smokeless tobacco: Never Used  Substance Use Topics  . Alcohol use: No  . Drug use: No    Family History  Problem Relation Age of Onset  . CAD Paternal Grandfather 41  . CAD Paternal Grandmother 8  . Diabetes Maternal Grandmother   . Diabetes Maternal Grandfather   . Hypertension Brother     No Known Allergies  Medication list has been reviewed and updated.  Current Outpatient Medications on File Prior to Visit  Medication Sig Dispense Refill  . acetaminophen (TYLENOL) 500 MG tablet Take 1,000 mg by mouth every 6 (six) hours as needed for mild pain or fever.    Marland Kitchen amitriptyline (ELAVIL) 50 MG tablet Take 1 tablet (50 mg total) by mouth at bedtime. 90 tablet 4  . Apple Cider Vinegar 500 MG TABS Take 500 mg by mouth daily.    . bisoprolol (ZEBETA) 5 MG tablet TAKE 1 TABLET BY MOUTH EVERY DAY (Patient taking differently: Take 5 mg by mouth daily. TAKE 1 TABLET BY MOUTH EVERY DAY) 90 tablet 3  . BLISOVI 24 FE 1-20 MG-MCG(24) tablet Take 1 tablet by mouth daily.    Marland Kitchen  cholecalciferol (VITAMIN D3) 25 MCG (1000 UT) tablet Take 1,000 Units by mouth daily.    . fluticasone (FLONASE) 50 MCG/ACT nasal spray Place 2 sprays into both nostrils daily as needed for congestion.    . Fremanezumab-vfrm (AJOVY) 225 MG/1.5ML SOAJ Inject 225 mg into the skin every 30 (thirty) days. 3 pen 3  . hydrochlorothiazide (HYDRODIURIL) 50 MG tablet TAKE 1 TABLET BY MOUTH EVERY DAY 90 tablet 3  . KLOR-CON M10 10 MEQ tablet TAKE 1 TABLET BY MOUTH EVERY DAY 90 tablet 1  . Omega-3 Fatty Acids (FISH OIL) 1000 MG CAPS Take 1,000 mg by mouth daily.     . phentermine (ADIPEX-P) 37.5 MG tablet Take 37.5 mg by mouth daily.  3  . polyethylene glycol (MIRALAX / GLYCOLAX) 17 g packet Take 17 g by mouth daily as needed for moderate constipation. 14 each 0  .  rizatriptan (MAXALT-MLT) 10 MG disintegrating tablet Take 1 tablet (10 mg total) by mouth as needed for migraine. May repeat in 2 hours if needed 9 tablet 11  . topiramate (TOPAMAX) 50 MG tablet Take 1 tablet (50 mg total) by mouth 2 (two) times daily. 180 tablet 4  . vitamin B-12 (CYANOCOBALAMIN) 1000 MCG tablet Take 1,000 mcg by mouth daily.    . vitamin C (ASCORBIC ACID) 500 MG tablet Take 1,000 mg by mouth daily.      No current facility-administered medications on file prior to visit.    Review of Systems:  As per HPI- otherwise negative.   Physical Examination: Vitals:   05/11/19 1558  BP: 131/88  Pulse: 93  Resp: 17  SpO2: 98%   Vitals:   05/11/19 1558  Weight: 198 lb (89.8 kg)  Height: 5\' 5"  (1.651 m)   Body mass index is 32.95 kg/m. Ideal Body Weight: Weight in (lb) to have BMI = 25: 149.9  GEN: no acute distress.  Obese, otherwise looks well HEENT: Atraumatic, Normocephalic. Bilateral TM wnl, oropharynx normal.  PEERL,EOMI.   Ears and Nose: No external deformity. CV: RRR, No M/G/R. No JVD. No thrill. No extra heart sounds. PULM: CTA B, no wheezes, crackles, rhonchi. No retractions. No resp. distress. No accessory muscle use. ABD: S, NT, ND, +BS. No rebound. No HSM. EXTR: No c/c/e PSYCH: Normally interactive. Conversant.  Her hair is thin around the hairline, though she seems to have a lot of short new hairs coming in.  I am hopeful that her hair loss is resolving.  The rest of her scalp is relatively normal, no bald spots are noted   Assessment and Plan: Screening for hyperlipidemia - Plan: Lipid panel  Screening for deficiency anemia - Plan: CBC  Screening for diabetes mellitus - Plan: Comprehensive metabolic panel, Hemoglobin A1c  Essential hypertension - Plan: Comprehensive metabolic panel  Screening for thyroid disorder - Plan: TSH  Hair loss - Plan: Ferritin  Here today for a follow-up visit.  Patient had a serious bout of COVID-19 late last year,  and then suffered from increasing headaches.  I suspect she may have stress-induced hair loss her children effluvium We will check her TSH and ferritin today Encouraged her to try Rogaine for a few months Will plan further follow- up pending labs. Blood pressure currently well controlled  Moderate medical decision making today  This visit occurred during the SARS-CoV-2 public health emergency.  Safety protocols were in place, including screening questions prior to the visit, additional usage of staff PPE, and extensive cleaning of exam room while observing appropriate contact  time as indicated for disinfecting solutions.   Moderate medical decision making today Signed Lamar Blinks, MD  addnd 5/4- received her labs as follows  Results for orders placed or performed in visit on 05/11/19  CBC  Result Value Ref Range   WBC 9.4 4.0 - 10.5 K/uL   RBC 5.40 (H) 3.87 - 5.11 Mil/uL   Platelets 307.0 150.0 - 400.0 K/uL   Hemoglobin 14.4 12.0 - 15.0 g/dL   HCT 42.6 36.0 - 46.0 %   MCV 78.7 78.0 - 100.0 fl   MCHC 33.9 30.0 - 36.0 g/dL   RDW 13.8 11.5 - 15.5 %  Comprehensive metabolic panel  Result Value Ref Range   Sodium 138 135 - 145 mEq/L   Potassium 3.0 (L) 3.5 - 5.1 mEq/L   Chloride 98 96 - 112 mEq/L   CO2 29 19 - 32 mEq/L   Glucose, Bld 132 (H) 70 - 99 mg/dL   BUN 12 6 - 23 mg/dL   Creatinine, Ser 0.79 0.40 - 1.20 mg/dL   Total Bilirubin 0.3 0.2 - 1.2 mg/dL   Alkaline Phosphatase 46 39 - 117 U/L   AST 15 0 - 37 U/L   ALT 15 0 - 35 U/L   Total Protein 7.3 6.0 - 8.3 g/dL   Albumin 4.2 3.5 - 5.2 g/dL   GFR 80.04 >60.00 mL/min   Calcium 9.6 8.4 - 10.5 mg/dL  Hemoglobin A1c  Result Value Ref Range   Hgb A1c MFr Bld 6.1 4.6 - 6.5 %  Lipid panel  Result Value Ref Range   Cholesterol 156 0 - 200 mg/dL   Triglycerides 171.0 (H) 0.0 - 149.0 mg/dL   HDL 42.70 >39.00 mg/dL   VLDL 34.2 0.0 - 40.0 mg/dL   LDL Cholesterol 79 0 - 99 mg/dL   Total CHOL/HDL Ratio 4    NonHDL 113.55    TSH  Result Value Ref Range   TSH 3.35 0.35 - 4.50 uIU/mL  Ferritin  Result Value Ref Range   Ferritin 29.1 10.0 - 291.0 ng/mL   Message to pt

## 2019-05-10 NOTE — Patient Instructions (Addendum)
It was great to see you again today, I will be in touch with your labs as soon as possible Please get some OTC Rogaine for women and try it Let me know how this works for you Assuming labs are ok let's plan to visit in 6 months

## 2019-05-11 ENCOUNTER — Ambulatory Visit: Payer: BC Managed Care – PPO | Admitting: Family Medicine

## 2019-05-11 ENCOUNTER — Encounter: Payer: Self-pay | Admitting: Family Medicine

## 2019-05-11 ENCOUNTER — Other Ambulatory Visit: Payer: Self-pay

## 2019-05-11 VITALS — BP 131/88 | HR 93 | Resp 17 | Ht 65.0 in | Wt 198.0 lb

## 2019-05-11 DIAGNOSIS — L659 Nonscarring hair loss, unspecified: Secondary | ICD-10-CM | POA: Diagnosis not present

## 2019-05-11 DIAGNOSIS — I10 Essential (primary) hypertension: Secondary | ICD-10-CM

## 2019-05-11 DIAGNOSIS — Z130 Encounter for screening for diseases of the blood and blood-forming organs and certain disorders involving the immune mechanism: Secondary | ICD-10-CM

## 2019-05-11 DIAGNOSIS — Z13220 Encounter for screening for lipoid disorders: Secondary | ICD-10-CM

## 2019-05-11 DIAGNOSIS — Z1329 Encounter for screening for other suspected endocrine disorder: Secondary | ICD-10-CM | POA: Diagnosis not present

## 2019-05-11 DIAGNOSIS — E876 Hypokalemia: Secondary | ICD-10-CM

## 2019-05-11 DIAGNOSIS — Z131 Encounter for screening for diabetes mellitus: Secondary | ICD-10-CM | POA: Diagnosis not present

## 2019-05-12 ENCOUNTER — Encounter: Payer: Self-pay | Admitting: Family Medicine

## 2019-05-12 DIAGNOSIS — G5603 Carpal tunnel syndrome, bilateral upper limbs: Secondary | ICD-10-CM

## 2019-05-12 LAB — LIPID PANEL
Cholesterol: 156 mg/dL (ref 0–200)
HDL: 42.7 mg/dL (ref >39.00)
LDL Cholesterol: 79 mg/dL (ref 0–99)
NonHDL: 113.55
Total CHOL/HDL Ratio: 4
Triglycerides: 171 mg/dL — ABNORMAL HIGH (ref 0.0–149.0)
VLDL: 34.2 mg/dL (ref 0.0–40.0)

## 2019-05-12 LAB — CBC
HCT: 42.6 % (ref 36.0–46.0)
Hemoglobin: 14.4 g/dL (ref 12.0–15.0)
MCHC: 33.9 g/dL (ref 30.0–36.0)
MCV: 78.7 fl (ref 78.0–100.0)
Platelets: 307 10*3/uL (ref 150.0–400.0)
RBC: 5.4 Mil/uL — ABNORMAL HIGH (ref 3.87–5.11)
RDW: 13.8 % (ref 11.5–15.5)
WBC: 9.4 10*3/uL (ref 4.0–10.5)

## 2019-05-12 LAB — COMPREHENSIVE METABOLIC PANEL
ALT: 15 U/L (ref 0–35)
AST: 15 U/L (ref 0–37)
Albumin: 4.2 g/dL (ref 3.5–5.2)
Alkaline Phosphatase: 46 U/L (ref 39–117)
BUN: 12 mg/dL (ref 6–23)
CO2: 29 mEq/L (ref 19–32)
Calcium: 9.6 mg/dL (ref 8.4–10.5)
Chloride: 98 mEq/L (ref 96–112)
Creatinine, Ser: 0.79 mg/dL (ref 0.40–1.20)
GFR: 80.04 mL/min (ref >60.00)
Glucose, Bld: 132 mg/dL — ABNORMAL HIGH (ref 70–99)
Potassium: 3 mEq/L — ABNORMAL LOW (ref 3.5–5.1)
Sodium: 138 mEq/L (ref 135–145)
Total Bilirubin: 0.3 mg/dL (ref 0.2–1.2)
Total Protein: 7.3 g/dL (ref 6.0–8.3)

## 2019-05-12 LAB — TSH: TSH: 3.35 u[IU]/mL (ref 0.35–4.50)

## 2019-05-12 LAB — FERRITIN: Ferritin: 29.1 ng/mL (ref 10.0–291.0)

## 2019-05-12 LAB — HEMOGLOBIN A1C: Hgb A1c MFr Bld: 6.1 % (ref 4.6–6.5)

## 2019-05-12 MED ORDER — POTASSIUM CHLORIDE CRYS ER 20 MEQ PO TBCR: 20.0000 meq | EXTENDED_RELEASE_TABLET | Freq: Every day | ORAL | 3 refills | Status: DC

## 2019-05-12 NOTE — Addendum Note (Signed)
Addended by: Abbe Amsterdam C on: 05/12/2019 05:44 PM   Modules accepted: Orders

## 2019-06-09 ENCOUNTER — Encounter: Payer: Self-pay | Admitting: Family Medicine

## 2019-06-10 ENCOUNTER — Other Ambulatory Visit: Payer: Self-pay | Admitting: Family Medicine

## 2019-06-10 MED ORDER — NURTEC 75 MG PO TBDP: 75.0000 mg | ORAL_TABLET | Freq: Every day | ORAL | 11 refills | Status: DC | PRN

## 2019-06-10 NOTE — Progress Notes (Signed)
nurtec 

## 2019-06-23 ENCOUNTER — Telehealth: Payer: Self-pay | Admitting: Family Medicine

## 2019-06-23 NOTE — Telephone Encounter (Signed)
Bill from [employer]	[place] has called to check on the authorization of pt's Nurtec he can be reached at [phone] xt [phone]

## 2019-06-23 NOTE — Telephone Encounter (Signed)
CMM intiated key [id number] for Nurtec tabsl G43.109 amitriptyline, topiramate, rizatriptan, motrin, BC powder, tylenol, excedrin migraine, ajovy. Determination pending.

## 2019-06-24 NOTE — Telephone Encounter (Signed)
PA for Nurtec approved  Approval period 06/23/2019-06/22/2020  Notice faxed to pharmacy

## 2019-07-20 ENCOUNTER — Other Ambulatory Visit: Payer: Self-pay | Admitting: Family Medicine

## 2019-07-20 DIAGNOSIS — I10 Essential (primary) hypertension: Secondary | ICD-10-CM

## 2019-07-23 ENCOUNTER — Encounter: Payer: Self-pay | Admitting: Family Medicine

## 2019-08-12 ENCOUNTER — Telehealth: Payer: Self-pay | Admitting: Family Medicine

## 2019-08-12 NOTE — Telephone Encounter (Signed)
Nurtec (Joelle) follow up on if PA are being submitted for Rimegepant Sulfate (NURTEC) 75 MG TBDP. Call [phone] Ext [phone].

## 2019-08-13 ENCOUNTER — Encounter: Payer: Self-pay | Admitting: Family Medicine

## 2019-08-13 MED ORDER — ALBUTEROL SULFATE HFA 108 (90 BASE) MCG/ACT IN AERS: 2.0000 | INHALATION_SPRAY | Freq: Four times a day (QID) | RESPIRATORY_TRACT | 2 refills | Status: DC | PRN

## 2019-09-02 NOTE — Telephone Encounter (Signed)
Per previous note from 06/23/19 - Nurtec PA completed on covermymeds (key  [id number]). PA for Nurtec approved. Approval period 06/23/2019-06/22/2020.  I called Joelle back at [facility] and left a message on her voicemail with this information. Provided our number to call back if needed.

## 2019-09-02 NOTE — Telephone Encounter (Signed)
Joelle from [employer] Auth for Nurtec has called back to inquire about the status of the pa on Nurtec for pt.  Please call [phone] xt [phone]

## 2019-10-08 ENCOUNTER — Ambulatory Visit: Payer: BC Managed Care – PPO | Admitting: Family Medicine

## 2019-10-31 ENCOUNTER — Other Ambulatory Visit: Payer: Self-pay | Admitting: Diagnostic Neuroimaging

## 2019-11-07 ENCOUNTER — Other Ambulatory Visit: Payer: Self-pay | Admitting: Family Medicine

## 2019-11-11 DIAGNOSIS — R7303 Prediabetes: Secondary | ICD-10-CM | POA: Insufficient documentation

## 2019-11-11 NOTE — Progress Notes (Addendum)
[facility] [address] [place], [place] [address] [phone]  Date:  11/12/2019   Name:  Kylie Galloway   DOB:  Jun 09, 1975   MRN:  [id number]  PCP:  Pearline Cables, MD    Chief Complaint: Hyperlipidemia (6 month follow up)   History of Present Illness:  Kylie Galloway is a 44 y.o. very pleasant female patient who presents with the following:  Patient today for follow-up visit-29-month recheck History of migraine headache, hypertension, prediabetes Last seen by myself in May of this year She was admitted with COVID-19 pneumonia over the holidays 2020-thankfully she recovered  She sees neurology for her migraine headaches, at her last visit she had just started using Ajovy injections She feels like this helps her some- however towards the end of the month it will wear off She also has nurtec to use as needed during the day-this does help.  Overall her headaches are improved but not completely suppressed  Her home BP has looked ok- no concerns here unless she is really stressed  She takes hctz and also potassium-she uses this more for lower extremity swelling as opposed to blood pressure.  She tends to have lower extremity edema that gets worse as the day goes on, typical of venous insufficiency She tries to avoid salt as this will increase her swelling She is on her feet all day long- she works in [employer] She has tried to use compression socks, but have not been able to find any that are comfortable on her legs  Diabetes does run in her family   BP Readings from Last 3 Encounters:  11/12/19 124/70  05/11/19 131/88  04/06/19 138/78    Pap smear- she has GYN  mammo- will order  Flu vaccine- prefers to wait as she just had her covid booster She has completed her COVID-19 series including booster Patient Active Problem List   Diagnosis Date Noted  . Prediabetes 11/11/2019  . Pneumonia due to COVID-19  virus 01/02/2019  . Respiratory failure with hypoxia (HCC) 01/02/2019  . Ingrown toenail 10/26/2017  . Migraine 12/25/2015  . Hypertension 01/24/2012    Past Medical History:  Diagnosis Date  . Allergy   . Anxiety   . H/O Bell's palsy    rt sided  . Hypertension   . Migraine aura, persistent     Past Surgical History:  Procedure Laterality Date  . CESAREAN SECTION  2010    Social History   Tobacco Use  . Smoking status: Never Smoker  . Smokeless tobacco: Never Used  Substance Use Topics  . Alcohol use: No  . Drug use: No    Family History  Problem Relation Age of Onset  . CAD Paternal Grandfather 38  . CAD Paternal Grandmother 17  . Diabetes Maternal Grandmother   . Diabetes Maternal Grandfather   . Hypertension Brother     No Known Allergies  Medication list has been reviewed and updated.  Current Outpatient Medications on File Prior to Visit  Medication Sig Dispense Refill  . acetaminophen (TYLENOL) 500 MG tablet Take 1,000 mg by mouth every 6 (six) hours as needed for mild pain or fever.    Marland Kitchen albuterol (VENTOLIN HFA) 108 (90 Base) MCG/ACT inhaler TAKE 2 PUFFS BY MOUTH EVERY 6 HOURS AS NEEDED FOR WHEEZE OR SHORTNESS OF BREATH 18 each 2  . amitriptyline (ELAVIL) 50 MG tablet Take 1 tablet (50 mg total) by  mouth at bedtime. 90 tablet 4  . Apple Cider Vinegar 500 MG TABS Take 500 mg by mouth daily.    . bisoprolol (ZEBETA) 5 MG tablet Take 1 tablet (5 mg total) by mouth daily. 90 tablet 1  . cholecalciferol (VITAMIN D3) 25 MCG (1000 UT) tablet Take 1,000 Units by mouth daily.    . fluticasone (FLONASE) 50 MCG/ACT nasal spray Place 2 sprays into both nostrils daily as needed for congestion.    . Fremanezumab-vfrm (AJOVY) 225 MG/1.5ML SOAJ Inject 225 mg into the skin every 30 (thirty) days. 3 pen 3  . hydrochlorothiazide (HYDRODIURIL) 50 MG tablet TAKE 1 TABLET BY MOUTH EVERY DAY 90 tablet 3  . Omega-3 Fatty Acids (FISH OIL) 1000 MG CAPS Take 1,000 mg by mouth  daily.     . phentermine (ADIPEX-P) 37.5 MG tablet Take 37.5 mg by mouth daily.  3  . polyethylene glycol (MIRALAX / GLYCOLAX) 17 g packet Take 17 g by mouth daily as needed for moderate constipation. 14 each 0  . potassium chloride SA (KLOR-CON) 20 MEQ tablet Take 1 tablet (20 mEq total) by mouth daily. 90 tablet 3  . Rimegepant Sulfate (NURTEC) 75 MG TBDP Take 75 mg by mouth daily as needed (take for abortive therapy of migraine, no more than 1 tablet in 24 hours or 10 per month). 10 tablet 11  . rizatriptan (MAXALT-MLT) 10 MG disintegrating tablet Take 1 tablet (10 mg total) by mouth as needed for migraine. May repeat in 2 hours if needed 9 tablet 11  . topiramate (TOPAMAX) 50 MG tablet TAKE 1 TABLET BY MOUTH TWICE A DAY 180 tablet 4  . vitamin B-12 (CYANOCOBALAMIN) 1000 MCG tablet Take 1,000 mcg by mouth daily.    . vitamin C (ASCORBIC ACID) 500 MG tablet Take 1,000 mg by mouth daily.      No current facility-administered medications on file prior to visit.    Review of Systems:  As per HPI- otherwise negative.   Physical Examination: Vitals:   11/12/19 1600  BP: 124/70  Pulse: 69  Resp: 16  SpO2: 98%   Vitals:   11/12/19 1600  Weight: 205 lb (93 kg)  Height: 5\' 5"  (1.651 m)   Body mass index is 34.11 kg/m. Ideal Body Weight: Weight in (lb) to have BMI = 25: 149.9  GEN: no acute distress.  Obese, looks well HEENT: Atraumatic, Normocephalic.  Ears and Nose: No external deformity. CV: RRR, No M/G/R. No JVD. No thrill. No extra heart sounds. PULM: CTA B, no wheezes, crackles, rhonchi. No retractions. No resp. distress. No accessory muscle use. ABD: S, NT, ND, +BS. No rebound. No HSM. EXTR: No c/c.  Trace edema is present in both feet.  Patient states her swelling is not bad right now.  She does have generous calves which could make compression socks fitting difficult PSYCH: Normally interactive. Conversant.    Assessment and Plan: Encounter for screening mammogram for  malignant neoplasm of breast - Plan: MM 3D SCREEN BREAST BILATERAL  Prediabetes - Plan: Basic metabolic panel, Hemoglobin A1c  Lower extremity edema  Hypokalemia  Ordered mammogram to screen for breast cancer Prediabetes, A1c pending Lower extremity edema-likely due to venous insufficiency.  Encourage patient to keep trying to find a compression socks that will work for her.  I do think it would be helpful She does not feel that HCTZ is all that helpful however.  I advised her that she can stop taking this-stop potassium as well if she holds  HCTZ.  Monitor blood pressure.  If blood pressure does not go up, she does not have to continue HCTZ.  If blood pressure does go up we can either go back on HCTZ or use an alternative medication.  She will keep me posted This visit occurred during the SARS-CoV-2 public health emergency.  Safety protocols were in place, including screening questions prior to the visit, additional usage of staff PPE, and extensive cleaning of exam room while observing appropriate contact time as indicated for disinfecting solutions.    Signed Abbe Amsterdam, MD  11/5, received labs as below.  Message to patient  Results for orders placed or performed in visit on 11/12/19  Basic metabolic panel  Result Value Ref Range   Glucose, Bld 120 (H) 65 - 99 mg/dL   BUN 17 7 - 25 mg/dL   Creat 5.02 7.74 - 1.28 mg/dL   BUN/Creatinine Ratio NOT APPLICABLE 6 - 22 (calc)   Sodium 142 135 - 146 mmol/L   Potassium 3.0 (L) 3.5 - 5.3 mmol/L   Chloride 101 98 - 110 mmol/L   CO2 30 20 - 32 mmol/L   Calcium 9.5 8.6 - 10.2 mg/dL  Hemoglobin N8M  Result Value Ref Range   Hgb A1c MFr Bld 6.1 (H) <5.7 % of total Hgb   Mean Plasma Glucose 128 (calc)   eAG (mmol/L) 7.1 (calc)

## 2019-11-11 NOTE — Patient Instructions (Addendum)
It was great to see you again today, take care!   I will be in touch with your labs If you don't feel like the hctz is helping you can stop it (stop potassium also in this case)- just watch your BP, if it goes up we may need to continue HCTZ or try something else for BP if you prefer  Keep looking for compression that will work for you- I do think it will make your legs feel better!

## 2019-11-12 ENCOUNTER — Other Ambulatory Visit: Payer: Self-pay

## 2019-11-12 ENCOUNTER — Ambulatory Visit: Payer: BC Managed Care – PPO | Admitting: Family Medicine

## 2019-11-12 ENCOUNTER — Encounter: Payer: Self-pay | Admitting: Family Medicine

## 2019-11-12 VITALS — BP 124/70 | HR 69 | Resp 16 | Ht 65.0 in | Wt 205.0 lb

## 2019-11-12 DIAGNOSIS — R600 Localized edema: Secondary | ICD-10-CM | POA: Diagnosis not present

## 2019-11-12 DIAGNOSIS — R7303 Prediabetes: Secondary | ICD-10-CM

## 2019-11-12 DIAGNOSIS — E876 Hypokalemia: Secondary | ICD-10-CM

## 2019-11-12 DIAGNOSIS — Z1231 Encounter for screening mammogram for malignant neoplasm of breast: Secondary | ICD-10-CM

## 2019-11-13 ENCOUNTER — Encounter: Payer: Self-pay | Admitting: Family Medicine

## 2019-11-13 LAB — HEMOGLOBIN A1C
Hgb A1c MFr Bld: 6.1 % of total Hgb — ABNORMAL HIGH (ref <5.7)
Mean Plasma Glucose: 128 (calc)
eAG (mmol/L): 7.1 (calc)

## 2019-11-13 LAB — BASIC METABOLIC PANEL
BUN: 17 mg/dL (ref 7–25)
CO2: 30 mmol/L (ref 20–32)
Calcium: 9.5 mg/dL (ref 8.6–10.2)
Chloride: 101 mmol/L (ref 98–110)
Creat: 0.82 mg/dL (ref 0.50–1.10)
Glucose, Bld: 120 mg/dL — ABNORMAL HIGH (ref 65–99)
Potassium: 3 mmol/L — ABNORMAL LOW (ref 3.5–5.3)
Sodium: 142 mmol/L (ref 135–146)

## 2019-11-25 ENCOUNTER — Other Ambulatory Visit: Payer: Self-pay | Admitting: Family Medicine

## 2019-11-25 DIAGNOSIS — Z5181 Encounter for therapeutic drug level monitoring: Secondary | ICD-10-CM

## 2019-11-25 MED ORDER — TRIAMTERENE-HCTZ 37.5-25 MG PO TABS: 1.0000 | ORAL_TABLET | Freq: Every day | ORAL | 4 refills | Status: DC

## 2019-12-11 ENCOUNTER — Encounter: Payer: Self-pay | Admitting: Family Medicine

## 2019-12-24 ENCOUNTER — Ambulatory Visit: Payer: BC Managed Care – PPO | Admitting: Neurology

## 2019-12-27 ENCOUNTER — Other Ambulatory Visit: Payer: Self-pay | Admitting: Family Medicine

## 2019-12-27 DIAGNOSIS — I10 Essential (primary) hypertension: Secondary | ICD-10-CM

## 2019-12-29 ENCOUNTER — Ambulatory Visit
Admission: RE | Admit: 2019-12-29 | Discharge: 2019-12-29 | Disposition: A | Discharge status: Home / Self Care | Payer: BC Managed Care – PPO | Source: Ambulatory Visit | Attending: Family Medicine | Admitting: Family Medicine

## 2019-12-29 ENCOUNTER — Other Ambulatory Visit: Payer: Self-pay

## 2019-12-29 DIAGNOSIS — Z1231 Encounter for screening mammogram for malignant neoplasm of breast: Secondary | ICD-10-CM

## 2020-01-04 ENCOUNTER — Other Ambulatory Visit: Payer: Self-pay | Admitting: Family Medicine

## 2020-01-04 ENCOUNTER — Encounter: Payer: Self-pay | Admitting: Family Medicine

## 2020-01-04 ENCOUNTER — Other Ambulatory Visit: Payer: Self-pay

## 2020-01-04 ENCOUNTER — Other Ambulatory Visit (INDEPENDENT_AMBULATORY_CARE_PROVIDER_SITE_OTHER): Payer: BC Managed Care – PPO

## 2020-01-04 DIAGNOSIS — Z5181 Encounter for therapeutic drug level monitoring: Secondary | ICD-10-CM | POA: Diagnosis not present

## 2020-01-04 DIAGNOSIS — R928 Other abnormal and inconclusive findings on diagnostic imaging of breast: Secondary | ICD-10-CM

## 2020-01-04 DIAGNOSIS — R921 Mammographic calcification found on diagnostic imaging of breast: Secondary | ICD-10-CM

## 2020-01-04 LAB — BASIC METABOLIC PANEL
BUN: 13 mg/dL (ref 6–23)
CO2: 25 mEq/L (ref 19–32)
Calcium: 9 mg/dL (ref 8.4–10.5)
Chloride: 104 mEq/L (ref 96–112)
Creatinine, Ser: 0.72 mg/dL (ref 0.40–1.20)
GFR: 103.39 mL/min (ref >60.00)
Glucose, Bld: 167 mg/dL — ABNORMAL HIGH (ref 70–99)
Potassium: 4 mEq/L (ref 3.5–5.1)
Sodium: 139 mEq/L (ref 135–145)

## 2020-02-04 ENCOUNTER — Telehealth: Payer: Self-pay | Admitting: Family Medicine

## 2020-02-04 DIAGNOSIS — G43909 Migraine, unspecified, not intractable, without status migrainosus: Secondary | ICD-10-CM

## 2020-02-04 DIAGNOSIS — G43109 Migraine with aura, not intractable, without status migrainosus: Secondary | ICD-10-CM

## 2020-02-04 MED ORDER — AJOVY 225 MG/1.5ML ~~LOC~~ SOAJ: 225.0000 mg | SUBCUTANEOUS | 3 refills | Status: DC

## 2020-02-04 NOTE — Telephone Encounter (Addendum)
Called [facility] spoke with Aquisi who stated the Ajovy Rx had 3 pens remaining on refill. She encountered an error when trying to refill and the Rx was deleted. She is asking for a new Rx to be sent. I advised her we will resend Ajovy Rx. She verbalized understanding, appreciation. New Rx sent for 1 pen with 3 refills. Patient has FU scheduled.

## 2020-02-04 NOTE — Telephone Encounter (Signed)
[facility] is asking for a call from RN to discuss a request for a refill of pt's Fremanezumab-vfrm (AJOVY) 225 MG/1.5ML SOAJ along with refills, please call and ask for Aquisi

## 2020-02-04 NOTE — Addendum Note (Signed)
Addended by: [place] Pink on: 02/04/2020 03:43 PM   Modules accepted: Orders

## 2020-02-07 ENCOUNTER — Other Ambulatory Visit: Payer: Self-pay | Admitting: Family Medicine

## 2020-02-12 ENCOUNTER — Ambulatory Visit
Admission: RE | Admit: 2020-02-12 | Discharge: 2020-02-12 | Disposition: A | Discharge status: Home / Self Care | Payer: Self-pay | Source: Ambulatory Visit | Attending: Family Medicine | Admitting: Family Medicine

## 2020-02-12 ENCOUNTER — Other Ambulatory Visit: Payer: Self-pay | Admitting: Family Medicine

## 2020-02-12 ENCOUNTER — Other Ambulatory Visit: Payer: Self-pay

## 2020-02-12 DIAGNOSIS — R928 Other abnormal and inconclusive findings on diagnostic imaging of breast: Secondary | ICD-10-CM

## 2020-02-12 DIAGNOSIS — R921 Mammographic calcification found on diagnostic imaging of breast: Secondary | ICD-10-CM

## 2020-02-15 ENCOUNTER — Encounter: Payer: Self-pay | Admitting: Neurology

## 2020-02-15 ENCOUNTER — Ambulatory Visit: Payer: BC Managed Care – PPO | Admitting: Neurology

## 2020-02-15 ENCOUNTER — Telehealth: Payer: Self-pay

## 2020-02-15 DIAGNOSIS — G43909 Migraine, unspecified, not intractable, without status migrainosus: Secondary | ICD-10-CM

## 2020-02-15 DIAGNOSIS — G43109 Migraine with aura, not intractable, without status migrainosus: Secondary | ICD-10-CM

## 2020-02-15 MED ORDER — AMITRIPTYLINE HCL 50 MG PO TABS: 50.0000 mg | ORAL_TABLET | Freq: Every day | ORAL | 4 refills | Status: DC

## 2020-02-15 MED ORDER — NURTEC 75 MG PO TBDP: 75.0000 mg | ORAL_TABLET | Freq: Every day | ORAL | 11 refills | Status: DC | PRN

## 2020-02-15 MED ORDER — TOPIRAMATE 50 MG PO TABS: 50.0000 mg | ORAL_TABLET | Freq: Two times a day (BID) | ORAL | 4 refills | Status: DC

## 2020-02-15 NOTE — Patient Instructions (Signed)
Continue current medications  Call for any worsening headaches  See you back in 1 year  

## 2020-02-15 NOTE — Progress Notes (Signed)
PATIENT: Kylie Galloway DOB: 1975/10/04  REASON FOR VISIT: follow up HISTORY FROM: patient  HISTORY OF PRESENT ILLNESS: Today 02/15/20  HISTORY Update 02/15/2020 SS: Kylie Galloway is a 45 year old female with history of migraine headaches.  She remains on amitriptyline and Topamax.  Ajovy was added at her last visit due to worsening headaches following Covid.  Headaches are much improved, Ajovy has been quite helpful.  Has a few more migraines during the last week of the month, into the first week of her injection.  She does great weeks 2 and 3, hardly any headaches.  Stress and weather are triggers.  Nurtec works great for abortive therapy.  Gets 10 tablets a month, never uses her whole supply.  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  No changes to overall health.  Here today for evaluation unaccompanied.  04/06/2019 AL: Kylie Galloway is a 45 y.o. female here today for follow up for migraines. She continues amitriptyline 50mg  at bedtime and topiramate 50mg  BID. She was diagnosed with Covid in December and hospitalized for 7 days with pneumonia. She reports that headaches have worsened since. She is having near daily headaches with 10-12 migrainous days each month. She does have black spots in vision prior to headaches. She has used rizatriptan with some relief but this makes her sleepy.   She is planning to see ortho in April for carpal tunnel. She is wearing wrist braces without much benefit.    HISTORY: (copied from Dr January note on 10/06/2018)  UPDATE (10/06/18, VRP): Since last visit, doingwell. Symptoms arestable. Severityis mild. No alleviating or aggravating factors. Toleratingmeds.Only 1-2 HA per month. Weather and allergies can aggravate HA.  UPDATE (09/30/17, VRP): Since last visit, doing well. Symptoms are mild. Severity is mild. No alleviating or aggravating factors. Tolerating meds. Avg 1 HA per month in general. Increasing numbness in hands and  feet.  UPDATE (03/25/17, VRP): Since last visit, was doing well until 3-4 months ago; now increasing HA 10-12 per month. Tolerating meds. No alleviating or aggravating factors. Sleep is suboptimal (5-6 hours per night or less).   UPDATE 06/23/15: Since last visit, doing well. HA are reduced. Only needed triptan 3-4 times in last 6 months. Tolerating TPX.   UPDATE 12/16/14: Since last visit 5-8 migraines, likely weather related. No other issues. Overall doing well.  UPDATE 08/10/14: Since last visit, doing better. Only 3 days of migraine since last visit. Tolerating TPX. Rizatriptan also working well. Overall doing well. MRI brain was normal. Still no specific triggers. Incidentally patient developed small rash on right perioral region 1 week ago, which started out as "cold sore". Patient denies any burning, numbness or pain with this rash. Rash seems to be stabilized and no longer expanding.  PRIOR HPI (06/25/14): 45 year old right-handed female here for evaluation of headaches, dizziness, visual changes. Since 2010 patient has had intermittent episodes of headache, frontal, occipital, sometimes unilateral pressure and throbbing sensation with nausea, photophobia, proceeded by seeing spots and vision changes. She was having 1 or less episodes per month. Gradually these increased uptake 3-4 per month. In 2016 she has had 5 episodes. Recent episodes have changed to include severe dizzy, spinning sensation, lasting much longer than previously. Prior episodes used the last 1-2 hours at a time. Now episodes can last 5 hours up to 2-3 days at a time. No prior diagnosis of migraine. Patient's daughter has some headaches but not officially diagnosed. Patient denies any specific triggering or aggravating factors.  No food, caffeine, hormonal, sleep or stress triggers. Patient has tried Motrin, BC powder, Tylenol, Aleve, Excedrin migraine in the past for headache treatment, with mild relief. Patient has remote  history of Bell's palsy on the right side 2003 with incomplete recovery. Patient has had intermittent bilateral hand numbness in the past.   REVIEW OF SYSTEMS: Out of a complete 14 system review of symptoms, the patient complains only of the following symptoms, and all other reviewed systems are negative.  Headache  ALLERGIES: No Known Allergies  HOME MEDICATIONS: Outpatient Medications Prior to Visit  Medication Sig Dispense Refill  . acetaminophen (TYLENOL) 500 MG tablet Take 1,000 mg by mouth every 6 (six) hours as needed for mild pain or fever.    Marland Kitchen albuterol (VENTOLIN HFA) 108 (90 Base) MCG/ACT inhaler TAKE 2 PUFFS BY MOUTH EVERY 6 HOURS AS NEEDED FOR WHEEZE OR SHORTNESS OF BREATH 18 each 2  . Apple Cider Vinegar 500 MG TABS Take 500 mg by mouth daily.    . bisoprolol (ZEBETA) 5 MG tablet Take 1 tablet (5 mg total) by mouth daily. 90 tablet 1  . cholecalciferol (VITAMIN D3) 25 MCG (1000 UT) tablet Take 1,000 Units by mouth daily.    . fluticasone (FLONASE) 50 MCG/ACT nasal spray Place 2 sprays into both nostrils daily as needed for congestion.    . Fremanezumab-vfrm (AJOVY) 225 MG/1.5ML SOAJ Inject 225 mg into the skin every 30 (thirty) days. 1.5 mL 3  . Omega-3 Fatty Acids (FISH OIL) 1000 MG CAPS Take 1,000 mg by mouth daily.     . phentermine (ADIPEX-P) 37.5 MG tablet Take 37.5 mg by mouth daily.  3  . polyethylene glycol (MIRALAX / GLYCOLAX) 17 g packet Take 17 g by mouth daily as needed for moderate constipation. 14 each 0  . triamterene-hydrochlorothiazide (MAXZIDE-25) 37.5-25 MG tablet TAKE 1 TABLET BY MOUTH DAILY. USE AS NEEDED FOR LEG SWELLING 90 tablet 0  . vitamin B-12 (CYANOCOBALAMIN) 1000 MCG tablet Take 1,000 mcg by mouth daily.    . vitamin C (ASCORBIC ACID) 500 MG tablet Take 1,000 mg by mouth daily.     Marland Kitchen amitriptyline (ELAVIL) 50 MG tablet Take 1 tablet (50 mg total) by mouth at bedtime. 90 tablet 4  . Rimegepant Sulfate (NURTEC) 75 MG TBDP Take 75 mg by mouth daily as  needed (take for abortive therapy of migraine, no more than 1 tablet in 24 hours or 10 per month). 10 tablet 11  . topiramate (TOPAMAX) 50 MG tablet TAKE 1 TABLET BY MOUTH TWICE A DAY 180 tablet 4  . rizatriptan (MAXALT-MLT) 10 MG disintegrating tablet Take 1 tablet (10 mg total) by mouth as needed for migraine. May repeat in 2 hours if needed 9 tablet 11   No facility-administered medications prior to visit.    PAST MEDICAL HISTORY: Past Medical History:  Diagnosis Date  . Allergy   . Anxiety   . H/O Bell's palsy    rt sided  . Hypertension   . Migraine aura, persistent     PAST SURGICAL HISTORY: Past Surgical History:  Procedure Laterality Date  . CESAREAN SECTION  2010    FAMILY HISTORY: Family History  Problem Relation Age of Onset  . CAD Paternal Grandfather 53  . CAD Paternal Grandmother 69  . Diabetes Maternal Grandmother   . Diabetes Maternal Grandfather   . Hypertension Brother     SOCIAL HISTORY: Social History   Socioeconomic History  . Marital status: Married    Spouse name:  Chris  . Number of children: 3  . Years of education: 24  . Highest education level: Not on file  Occupational History  . Not on file  Tobacco Use  . Smoking status: Never Smoker  . Smokeless tobacco: Never Used  Substance and Sexual Activity  . Alcohol use: No  . Drug use: No  . Sexual activity: Yes    Comment: married  Other Topics Concern  . Not on file  Social History Narrative   Works as a Scientist, research (medical).  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     Social Determinants of Health   Financial Resource Strain: Not on file  Food Insecurity: Not on file  Transportation Needs: Not on file  Physical Activity: Not on file  Stress: Not on file  Social Connections: Not on file  Intimate Partner Violence: Not on file   PHYSICAL EXAM  Vitals:   02/15/20 1338  BP: 114/72  Pulse: 83  Weight: 210 lb (95.3 kg)  Height: 5\' 5"  (1.651 m)   Body mass index is  34.95 kg/m.  Generalized: Well developed, in no acute distress   Neurological examination  Mentation: Alert oriented to time, place, history taking. Follows all commands speech and language fluent Cranial nerve II-XII: Pupils were equal round reactive to light. Extraocular movements were full, visual field were full on confrontational test. Facial sensation and strength were normal. Head turning and shoulder shrug  were normal and symmetric. Motor: The motor testing reveals 5 over 5 strength of all 4 extremities. Good symmetric motor tone is noted throughout.  Sensory: Sensory testing is intact to soft touch on all 4 extremities. No evidence of extinction is noted.  Coordination: Cerebellar testing reveals good finger-nose-finger and heel-to-shin bilaterally.  Gait and station: Gait is normal. Reflexes: Deep tendon reflexes are symmetric and normal bilaterally.   DIAGNOSTIC DATA (LABS, IMAGING, TESTING) - I reviewed patient records, labs, notes, testing and imaging myself where available.  Lab Results  Component Value Date   WBC 9.4 05/11/2019   HGB 14.4 05/11/2019   HCT 42.6 05/11/2019   MCV 78.7 05/11/2019   PLT 307.0 05/11/2019      Component Value Date/Time   NA 139 01/04/2020 1031   K 4.0 01/04/2020 1031   CL 104 01/04/2020 1031   CO2 25 01/04/2020 1031   GLUCOSE 167 (H) 01/04/2020 1031   BUN 13 01/04/2020 1031   CREATININE 0.72 01/04/2020 1031   CREATININE 0.82 11/12/2019 1615   CALCIUM 9.0 01/04/2020 1031   PROT 7.3 05/11/2019 1621   ALBUMIN 4.2 05/11/2019 1621   AST 15 05/11/2019 1621   ALT 15 05/11/2019 1621   ALKPHOS 46 05/11/2019 1621   BILITOT 0.3 05/11/2019 1621   GFRNONAA >60 01/07/2019 0441   GFRAA >60 01/07/2019 0441   Lab Results  Component Value Date   CHOL 156 05/11/2019   HDL 42.70 05/11/2019   LDLCALC 79 05/11/2019   TRIG 171.0 (H) 05/11/2019   CHOLHDL 4 05/11/2019   Lab Results  Component Value Date   HGBA1C 6.1 (H) 11/12/2019   No  results found for: VITAMINB12 Lab Results  Component Value Date   TSH 3.35 05/11/2019   ASSESSMENT AND PLAN 45 y.o. year old female  has a past medical history of Allergy, Anxiety, H/O Bell's palsy, Hypertension, and Migraine aura, persistent. here with:  1.  Chronic migraine headache -Excellent benefit with Ajovy, currently happy with migraine control -Continue Ajovy 225 mg injection for migraine prevention -Continue amitriptyline  50 mg at bedtime -Continue Topamax 50 mg twice a day -Wants to keep amitriptyline, Topamax at current dosing -Continue Nurtec as needed for acute headache -Follow-up in 1 year or sooner if needed  I spent 20 minutes of face-to-face and non-face-to-face time with patient.  This included previsit chart review, lab review, study review, order entry, electronic health record documentation, patient education.  Margie Ege, AGNP-C, DNP 02/15/2020, 2:03 PM [facility] [address], [address] [place], [place] [address] [phone]

## 2020-02-15 NOTE — Progress Notes (Signed)
I reviewed note and agree with plan.   Suanne Marker, MD 02/15/2020, 3:47 PM Certified in Neurology, Neurophysiology and Neuroimaging  [facility] [address] [place], [place] [address] [phone]

## 2020-02-15 NOTE — Telephone Encounter (Signed)
Attempted to call pt, LVM for call back  °

## 2020-02-25 ENCOUNTER — Other Ambulatory Visit: Payer: Self-pay

## 2020-02-25 ENCOUNTER — Ambulatory Visit
Admission: RE | Admit: 2020-02-25 | Discharge: 2020-02-25 | Disposition: A | Discharge status: Home / Self Care | Payer: BC Managed Care – PPO | Source: Ambulatory Visit | Attending: Family Medicine | Admitting: Family Medicine

## 2020-02-25 ENCOUNTER — Other Ambulatory Visit (HOSPITAL_COMMUNITY): Payer: Self-pay | Admitting: Diagnostic Radiology

## 2020-02-25 ENCOUNTER — Encounter: Payer: Self-pay | Admitting: Family Medicine

## 2020-02-25 DIAGNOSIS — R921 Mammographic calcification found on diagnostic imaging of breast: Secondary | ICD-10-CM

## 2020-02-25 HISTORY — PX: BREAST BIOPSY: SHX20

## 2020-03-01 ENCOUNTER — Telehealth: Payer: BC Managed Care – PPO | Admitting: Physician Assistant

## 2020-03-01 DIAGNOSIS — J019 Acute sinusitis, unspecified: Secondary | ICD-10-CM

## 2020-03-01 MED ORDER — AMOXICILLIN-POT CLAVULANATE 875-125 MG PO TABS: 1.0000 | ORAL_TABLET | Freq: Two times a day (BID) | ORAL | 0 refills | Status: DC

## 2020-03-01 NOTE — Progress Notes (Signed)
We are sorry that you are not feeling well.  Here is how we plan to help!  Based on what you have shared with me it looks like you have sinusitis.  It is likely that your migraine and vertigo are related to your sinus infection, however if these worsen in any way, please have an immediate face to face visit for further evaluation.  As below, I will write an antibiotic.  Please continue using your nasal spray.  Sinusitis is inflammation and infection in the sinus cavities of the head.  Based on your presentation I believe you most likely have Acute Bacterial Sinusitis.  This is an infection caused by bacteria and is treated with antibiotics. I have prescribed Augmentin 875mg /125mg  one tablet twice daily with food, for 7 days. You may use an oral decongestant such as Mucinex D or if you have glaucoma or high blood pressure use plain Mucinex. Saline nasal spray help and can safely be used as often as needed for congestion.  If you develop worsening sinus pain, fever or notice severe headache and vision changes, or if symptoms are not better after completion of antibiotic, please schedule an appointment with a health care provider.    Sinus infections are not as easily transmitted as other respiratory infection, however we still recommend that you avoid close contact with loved ones, especially the very [place] and elderly.  Remember to wash your hands thoroughly throughout the day as this is the number one way to prevent the spread of infection!  Home Care:  Only take medications as instructed by your medical team.  Complete the entire course of an antibiotic.  Do not take these medications with alcohol.  A steam or ultrasonic humidifier can help congestion.  You can place a towel over your head and breathe in the steam from hot water coming from a faucet.  Avoid close contacts especially the very [place] and the elderly.  Cover your mouth when you cough or sneeze.  Always remember to wash your  hands.  Get Help Right Away If:  You develop worsening fever or sinus pain.  You develop a severe head ache or visual changes.  Your symptoms persist after you have completed your treatment plan.  Make sure you  Understand these instructions.  Will watch your condition.  Will get help right away if you are not doing well or get worse.  Your e-visit answers were reviewed by a board certified advanced clinical practitioner to complete your personal care plan.  Depending on the condition, your plan could have included both over the counter or prescription medications.  If there is a problem please reply  once you have received a response from your provider.  Your safety is important to .  If you have drug allergies check your prescription carefully.    You can use MyChart to ask questions about today's visit, request a non-urgent call back, or ask for a work or school excuse for 24 hours related to this e-Visit. If it has been greater than 24 hours you will need to follow up with your provider, or enter a new e-Visit to address those concerns.  You will get an e-mail in the next two days asking about your experience.  I hope that your e-visit has been valuable and will speed your recovery. Thank you for using e-visits.   Greater than 5 minutes, yet less than 10 minutes of time have been spent researching, coordinating, and implementing care for this patient today

## 2020-03-03 ENCOUNTER — Ambulatory Visit: Payer: BC Managed Care – PPO | Admitting: Neurology

## 2020-03-08 ENCOUNTER — Ambulatory Visit: Payer: BC Managed Care – PPO | Admitting: Diagnostic Neuroimaging

## 2020-04-04 ENCOUNTER — Encounter: Payer: Self-pay | Admitting: Family Medicine

## 2020-04-04 MED ORDER — SILVER SULFADIAZINE 1 % EX CREA: 1.0000 "application " | TOPICAL_CREAM | Freq: Every day | CUTANEOUS | 0 refills | Status: AC

## 2020-05-01 ENCOUNTER — Other Ambulatory Visit: Payer: Self-pay | Admitting: Family Medicine

## 2020-05-06 ENCOUNTER — Telehealth: Payer: Self-pay | Admitting: Neurology

## 2020-05-06 DIAGNOSIS — G43909 Migraine, unspecified, not intractable, without status migrainosus: Secondary | ICD-10-CM

## 2020-05-06 DIAGNOSIS — G43109 Migraine with aura, not intractable, without status migrainosus: Secondary | ICD-10-CM

## 2020-05-06 NOTE — Telephone Encounter (Signed)
Pt is being told that there is a refusal of her refill on Fremanezumab-vfrm (AJOVY) 225 MG/1.5ML SOAJ.  Pt being told she is unknown to provider, please call

## 2020-05-09 MED ORDER — AJOVY 225 MG/1.5ML ~~LOC~~ SOAJ: 225.0000 mg | SUBCUTANEOUS | 3 refills | Status: DC

## 2020-05-09 NOTE — Telephone Encounter (Signed)
I have sent Rx to pharmacy

## 2020-05-09 NOTE — Addendum Note (Signed)
Addended by: Rocky Link A on: 05/09/2020 11:31 AM   Modules accepted: Orders

## 2020-05-09 NOTE — Telephone Encounter (Signed)
I don't see any record of the pharmacy sending [place] a request. I sent patient a mychart message making her aware that they may be sending it to the wrong office. I asked her to confirm the pharmacy and we would send in the refill for her.

## 2020-05-19 ENCOUNTER — Telehealth: Payer: BC Managed Care – PPO | Admitting: Physician Assistant

## 2020-05-19 DIAGNOSIS — J039 Acute tonsillitis, unspecified: Secondary | ICD-10-CM

## 2020-05-19 DIAGNOSIS — J0110 Acute frontal sinusitis, unspecified: Secondary | ICD-10-CM

## 2020-05-19 MED ORDER — AMOXICILLIN-POT CLAVULANATE 875-125 MG PO TABS: 1.0000 | ORAL_TABLET | Freq: Two times a day (BID) | ORAL | 0 refills | Status: DC

## 2020-05-19 NOTE — Progress Notes (Signed)
We are sorry that you are not feeling well.  Here is how we plan to help!  Based on what you have shared with me it looks like you have sinusitis.  Sinusitis is inflammation and infection in the sinus cavities of the head.  Based on your presentation I believe you most likely have Acute Bacterial Sinusitis and tonisllitis.  This is an infection caused by bacteria and is treated with antibiotics. I have prescribed Augmentin 875mg /125mg  one tablet twice daily with food, for 10 days. You may use an oral decongestant such as Mucinex D or if you have glaucoma or high blood pressure use plain Mucinex. Saline nasal spray help and can safely be used as often as needed for congestion.  If you develop worsening sinus pain, fever or notice severe headache and vision changes, or if symptoms are not better after completion of antibiotic, please schedule an appointment with a health care provider.    Sinus infections are not as easily transmitted as other respiratory infection, however we still recommend that you avoid close contact with loved ones, especially the very [place] and elderly.  Remember to wash your hands thoroughly throughout the day as this is the number one way to prevent the spread of infection!  Home Care:  Only take medications as instructed by your medical team.  Complete the entire course of an antibiotic.  Do not take these medications with alcohol.  A steam or ultrasonic humidifier can help congestion.  You can place a towel over your head and breathe in the steam from hot water coming from a faucet.  Avoid close contacts especially the very [place] and the elderly.  Cover your mouth when you cough or sneeze.  Always remember to wash your hands.  Get Help Right Away If:  You develop worsening fever or sinus pain.  You develop a severe head ache or visual changes.  Your symptoms persist after you have completed your treatment plan.  Make sure you  Understand these  instructions.  Will watch your condition.  Will get help right away if you are not doing well or get worse.  Your e-visit answers were reviewed by a board certified advanced clinical practitioner to complete your personal care plan.  Depending on the condition, your plan could have included both over the counter or prescription medications.  If there is a problem please reply  once you have received a response from your provider.  Your safety is important to .  If you have drug allergies check your prescription carefully.    You can use MyChart to ask questions about today's visit, request a non-urgent call back, or ask for a work or school excuse for 24 hours related to this e-Visit. If it has been greater than 24 hours you will need to follow up with your provider, or enter a new e-Visit to address those concerns.  You will get an e-mail in the next two days asking about your experience.  I hope that your e-visit has been valuable and will speed your recovery. Thank you for using e-visits.

## 2020-05-19 NOTE — Progress Notes (Signed)
I have spent 5 minutes in review of e-visit questionnaire, review and updating patient chart, medical decision making and response to patient.   Arissa Fagin Cody Keerstin Bjelland, PA-C    

## 2020-05-22 ENCOUNTER — Other Ambulatory Visit: Payer: Self-pay | Admitting: Family Medicine

## 2020-05-22 DIAGNOSIS — I10 Essential (primary) hypertension: Secondary | ICD-10-CM

## 2020-07-22 ENCOUNTER — Other Ambulatory Visit: Payer: Self-pay | Admitting: *Deleted

## 2020-07-22 ENCOUNTER — Encounter: Payer: Self-pay | Admitting: Neurology

## 2020-07-22 ENCOUNTER — Other Ambulatory Visit: Payer: Self-pay | Admitting: Neurology

## 2020-07-22 DIAGNOSIS — G43909 Migraine, unspecified, not intractable, without status migrainosus: Secondary | ICD-10-CM

## 2020-07-22 DIAGNOSIS — G43109 Migraine with aura, not intractable, without status migrainosus: Secondary | ICD-10-CM

## 2020-07-22 MED ORDER — AJOVY 225 MG/1.5ML ~~LOC~~ SOAJ: 225.0000 mg | SUBCUTANEOUS | 1 refills | Status: DC

## 2020-07-25 ENCOUNTER — Other Ambulatory Visit: Payer: Self-pay | Admitting: Neurology

## 2020-07-25 ENCOUNTER — Telehealth: Payer: Self-pay | Admitting: *Deleted

## 2020-07-25 DIAGNOSIS — G43909 Migraine, unspecified, not intractable, without status migrainosus: Secondary | ICD-10-CM

## 2020-07-25 DIAGNOSIS — G43109 Migraine with aura, not intractable, without status migrainosus: Secondary | ICD-10-CM

## 2020-07-25 NOTE — Telephone Encounter (Signed)
I called the pharmacy. Per [facility], her prescription is ready to pick up now ($5 copay). I will reply to her mychart message to provide this update. No PA is needed at this time.

## 2020-07-25 NOTE — Telephone Encounter (Signed)
Received this response:  Your PA request has been closed. AJOVY INJ 225/1. Refill too soon.

## 2020-07-25 NOTE — Telephone Encounter (Signed)
PA for Ajovy 225mg  started on covermymeds (key: [id number]). Pt has coverage through [facility] [employer]. Decision pending.

## 2020-09-01 ENCOUNTER — Other Ambulatory Visit: Payer: Self-pay | Admitting: Family Medicine

## 2020-09-18 ENCOUNTER — Other Ambulatory Visit: Payer: Self-pay | Admitting: Family Medicine

## 2020-10-19 ENCOUNTER — Other Ambulatory Visit: Payer: Self-pay | Admitting: Family Medicine

## 2021-01-02 IMAGING — CT CT ANGIO CHEST
2 of 7 series · 18 of 46 positions shown · IV contrast (OMNI)
Comparison: One-view chest x-ray 12/31/2018

CLINICAL DATA: Shortness of breath. COVID positive.

EXAM:
CT ANGIOGRAPHY CHEST WITH CONTRAST
TECHNIQUE: Multidetector CT imaging of the chest was performed using the
standard protocol during bolus administration of intravenous
contrast. Multiplanar CT image reconstructions and MIPs were
obtained to evaluate the vascular anatomy.
CONTRAST:  100mL OMNIPAQUE IOHEXOL 350 MG/ML SOLN

[Series 5: thins · axial · 0.88mm/px · z∈[+1184,+1426]mm · 15 of 272 slices shown]
[im 15/272  lung]
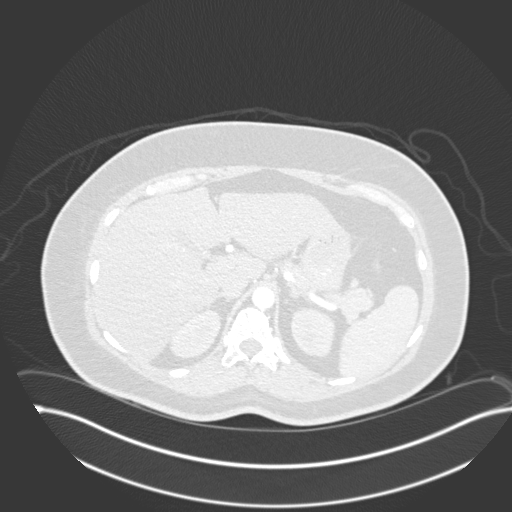
[im 29/272  soft-tissue]
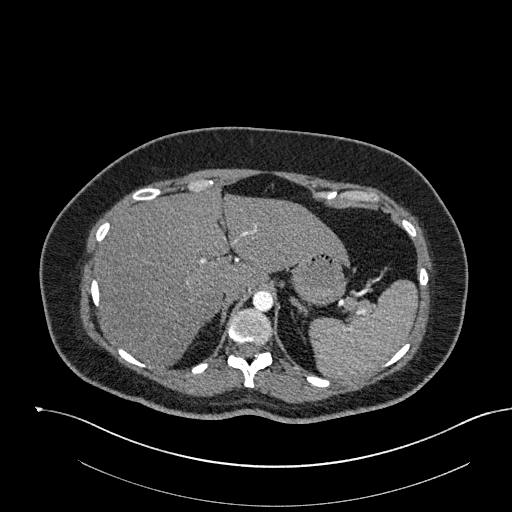
[im 58/272  lung]
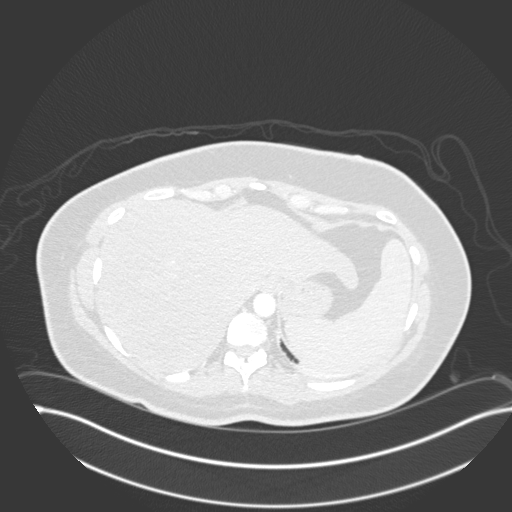
[im 72/272  soft-tissue]
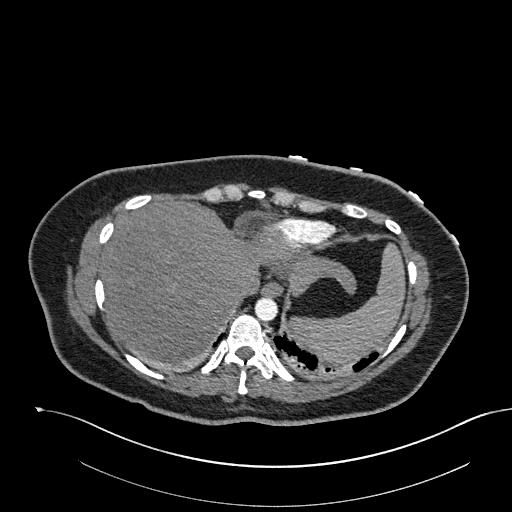
[im 86/272  lung]
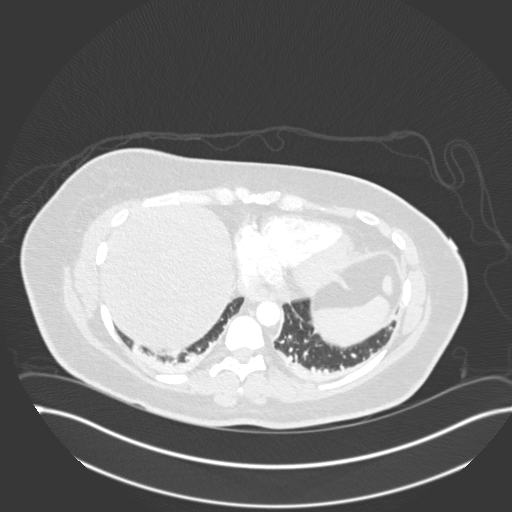
[im 100/272  soft-tissue]
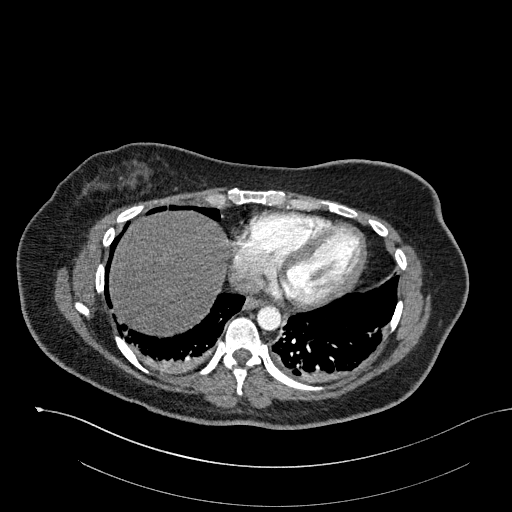
[im 115/272  lung]
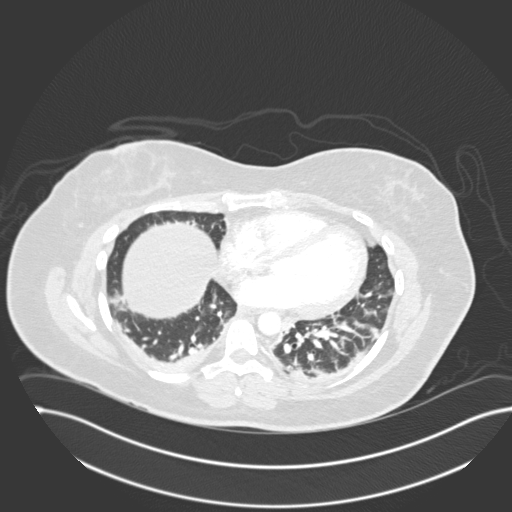
[im 143/272  soft-tissue]
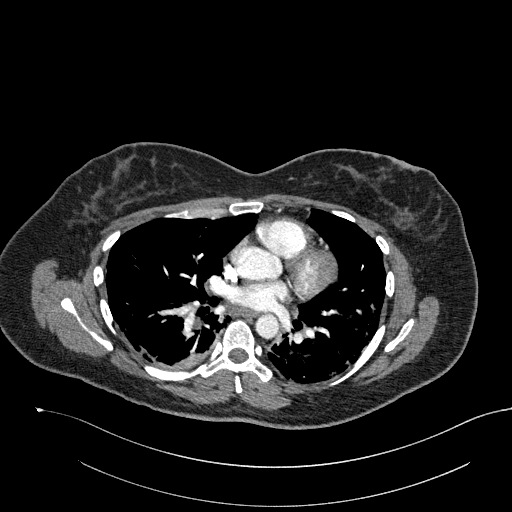
[im 157/272  lung]
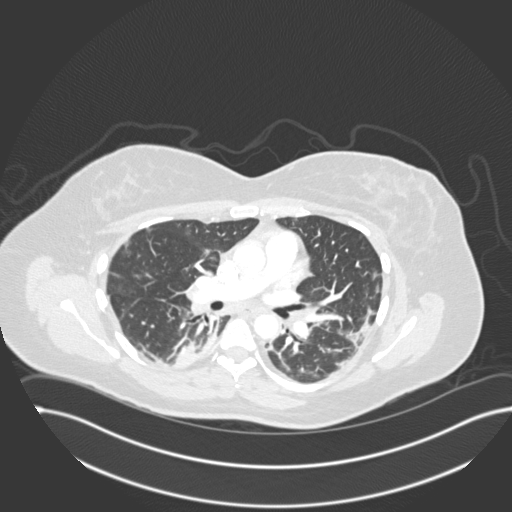
[im 172/272  soft-tissue]
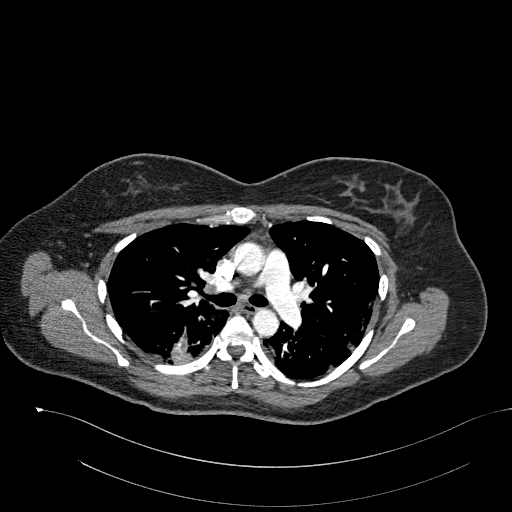
[im 186/272  lung]
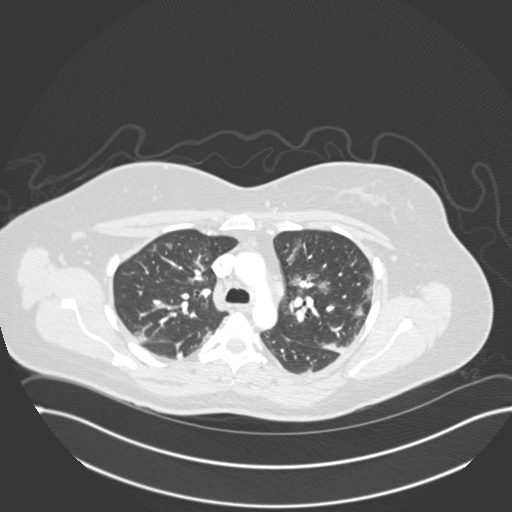
[im 200/272  soft-tissue]
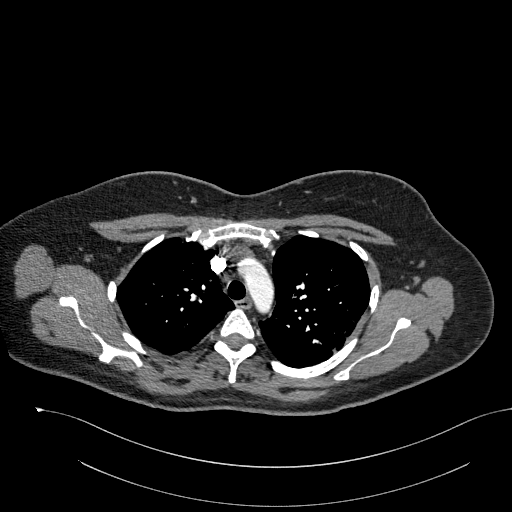
[im 229/272  lung]
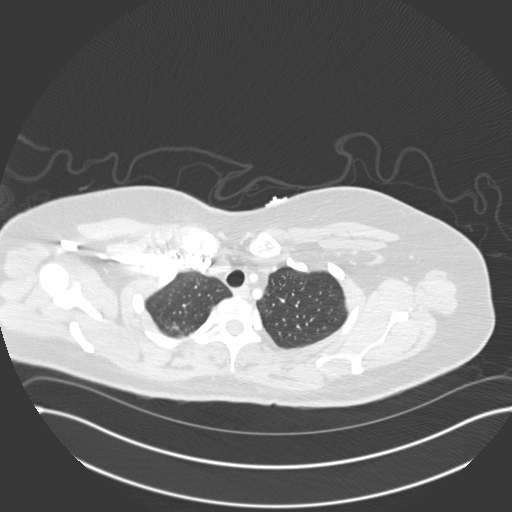
[im 243/272  soft-tissue]
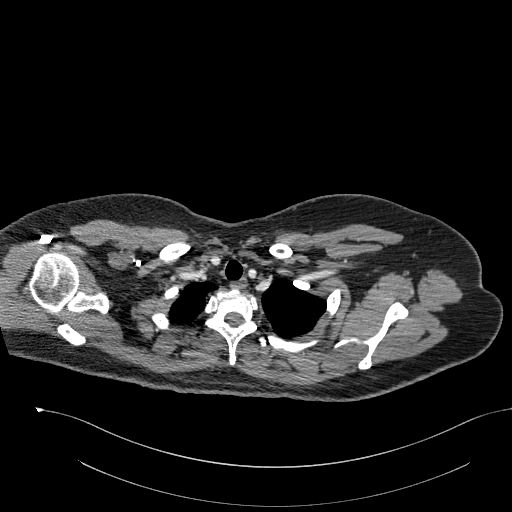
[im 257/272  lung]
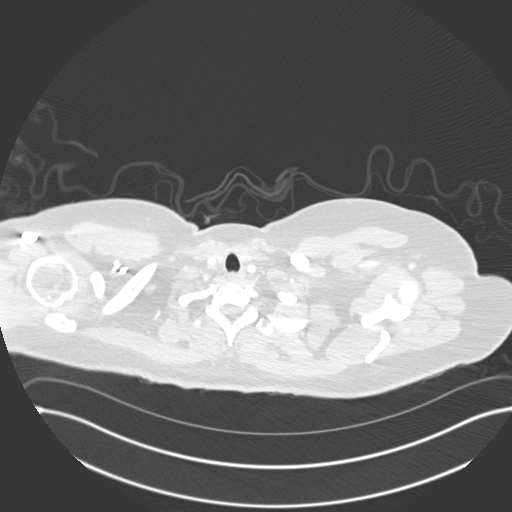

[Series 7: coronal mpr · coronal · 0.56mm/px · 3 of 151 slices shown]
[im 38/151  soft-tissue]
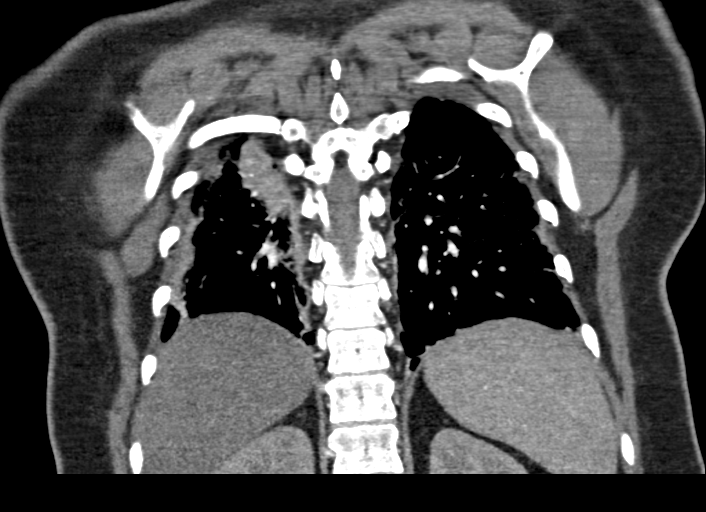
[im 76/151  soft-tissue]
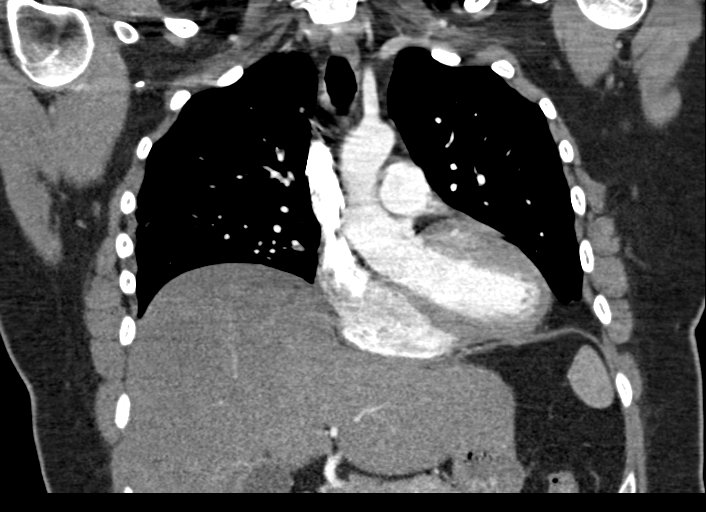
[im 113/151  soft-tissue]
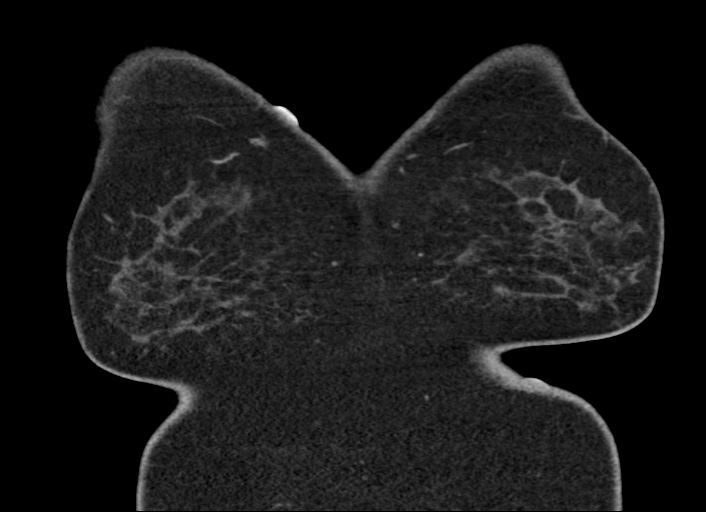

[18 of 46 positions shown; findings below may reference images not displayed]

FINDINGS: Cardiovascular: The heart size is normal. The aortic arch and great
vessels within normal limits.

Pulmonary artery opacification is excellent. No focal filling
defects are present to suggest pulmonary embolus. Pulmonary artery
size is within normal limits.

Mediastinum/Nodes: No significant mediastinal, hilar, or axillary
adenopathy scratched at there is mild prominence of hilar lymphoid
tissue bilaterally. Subcarinal lymph node is evident. No significant
mediastinal or axillary adenopathy is present otherwise. Thoracic
inlet is within normal limits. The esophagus is normal.

Lungs/Pleura: Bilateral patchy lower lobe dependent airspace disease
is present bilaterally. Less prominent patchy airspace disease is
present the upper lobes bilaterally. Most prominent airspace disease
is posteriorly in the right lower lobe. There is no pneumothorax or
significant pleural effusion.

Upper Abdomen: Diffuse fatty infiltration of the liver is noted.

Musculoskeletal: The vertebral body heights are maintained.
Calcified disc present at C5-6. Central canal is otherwise widely
patent.

Review of the MIP images confirms the above findings.
IMPRESSION: 1. No pulmonary embolus.
2. Bilateral patchy airspace disease compatible with multifocal
pneumonia.
3. Hilar and subcarinal lymphoid tissue is likely reactive.
4. Diffuse fatty infiltration of the liver.

## 2021-01-04 IMAGING — DX DG CHEST 1V PORT
1 series · 1 of 1 positions shown · non-contrast
Comparison: 12/31/2018

CLINICAL DATA: Shortness of breath.  COVID positive.

EXAM:
PORTABLE CHEST 1 VIEW

[chest ap]
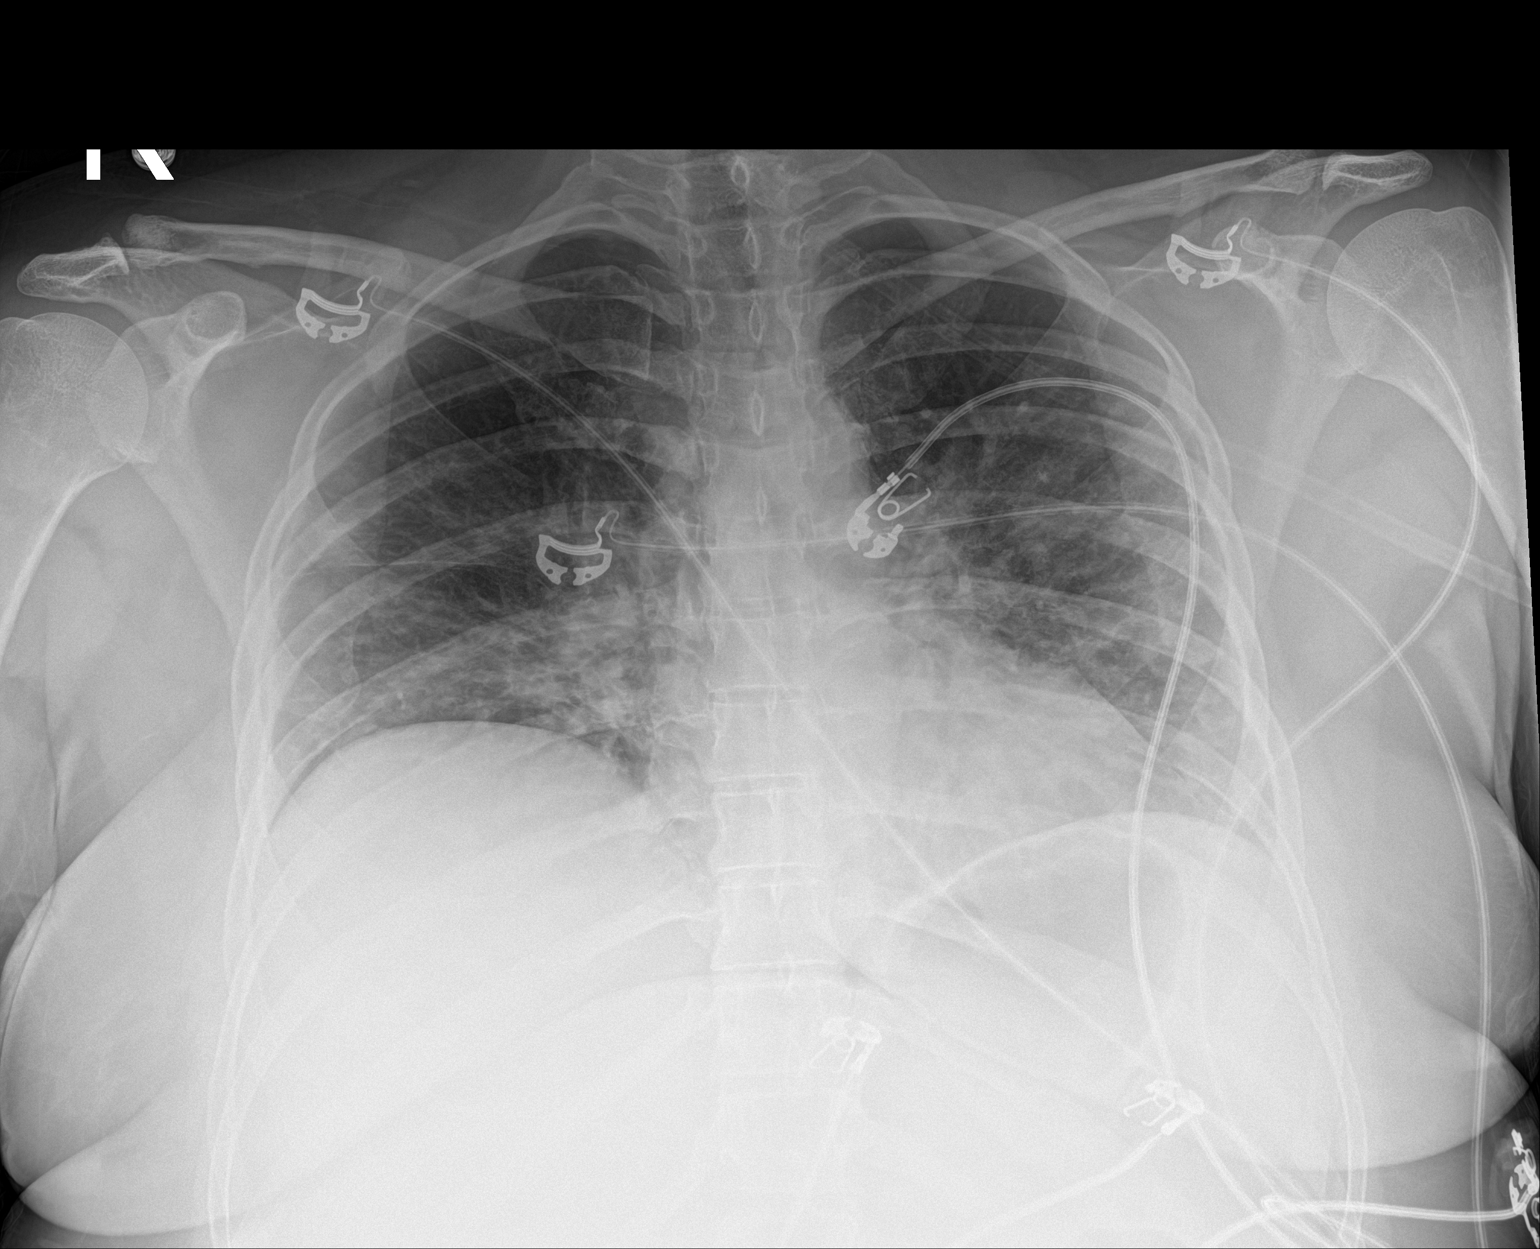

[1 of 1 positions shown; findings below may reference images not displayed]

FINDINGS: 7664 hours. Low volumes. The cardiopericardial silhouette is within
normal limits for size. Slight progression of patchy bilateral
airspace disease seen in the mid and lower lungs predominantly. No
associated pleural effusion. The visualized bony structures of the
thorax are intact. Telemetry leads overlie the chest.
IMPRESSION: Low volume film with slight interval progression of patchy bilateral
airspace disease.

## 2021-02-06 ENCOUNTER — Other Ambulatory Visit: Payer: Self-pay | Admitting: Neurology

## 2021-02-06 ENCOUNTER — Encounter: Payer: Self-pay | Admitting: Family Medicine

## 2021-02-06 DIAGNOSIS — G43109 Migraine with aura, not intractable, without status migrainosus: Secondary | ICD-10-CM

## 2021-02-06 DIAGNOSIS — G43909 Migraine, unspecified, not intractable, without status migrainosus: Secondary | ICD-10-CM

## 2021-02-07 ENCOUNTER — Other Ambulatory Visit: Payer: Self-pay | Admitting: Neurology

## 2021-02-07 DIAGNOSIS — G43109 Migraine with aura, not intractable, without status migrainosus: Secondary | ICD-10-CM

## 2021-02-07 DIAGNOSIS — G43909 Migraine, unspecified, not intractable, without status migrainosus: Secondary | ICD-10-CM

## 2021-02-07 MED ORDER — AJOVY 225 MG/1.5ML ~~LOC~~ SOAJ: 225.0000 mg | SUBCUTANEOUS | 0 refills | Status: DC

## 2021-02-14 NOTE — Progress Notes (Signed)
PATIENT: Kylie Galloway DOB: 01/16/1975  REASON FOR VISIT: follow up HISTORY FROM: patient  Chief Complaint  Patient presents with   Follow-up    Rm 10, alone. Here for yearly migraine f/u. Pt reports doing well. Able to manage migraines, Nurtec has been helpful.     HISTORY OF PRESENT ILLNESS: 02/15/21 ALL: Kylie Galloway returns for follow up for migraines. She continues Ajovy, topiramate 50mg  BID and amitriptyline 50mg  QHS. Nurtec used for abortive therapy. She reports having 4-5 headaches in a month. The Nurtec is helpful for her most of the time.    02/15/2020 SS:  Kylie Galloway is a 46 year old female with history of migraine headaches.  She remains on amitriptyline and Topamax. Ajovy was added at her last visit due to worsening headaches following Covid.   Headaches are much improved, Ajovy has been quite helpful.  Has a few more migraines during the last week of the month, into the first week of her injection.  She does great weeks 2 and 3, hardly any headaches.  Stress and weather are triggers.  Nurtec works great for abortive therapy.  Gets 10 tablets a month, never uses her whole supply.  She is a [employer] at a [employer].  No changes to overall health.  Here today for evaluation unaccompanied.  04/06/2019 ALL: Kylie Galloway is a 46 y.o. female here today for follow up for migraines. She continues amitriptyline 50mg  at bedtime and topiramate 50mg  BID. She was diagnosed with Covid in December and hospitalized for 7 days with pneumonia. She reports that headaches have worsened since. She is having near daily headaches with 10-12 migrainous days each month. She does have black spots in vision prior to headaches. She has used rizatriptan with some relief but this makes her sleepy.   She is planning to see ortho in April for carpal tunnel. She is wearing wrist braces without much benefit.    HISTORY: (copied from Dr note on 10/06/2018)  UPDATE (10/06/18, VRP):  Since last visit, doing well. Symptoms are stable. Severity is mild. No alleviating or aggravating factors. Tolerating meds. Only 1-2 HA per month. Weather and allergies can aggravate HA.    UPDATE (09/30/17, VRP): Since last visit, doing well. Symptoms are mild. Severity is mild. No alleviating or aggravating factors. Tolerating meds. Avg 1 HA per month in general. Increasing numbness in hands and feet.   UPDATE (03/25/17, VRP): Since last visit, was doing well until 3-4 months ago; now increasing HA 10-12 per month. Tolerating meds. No alleviating or aggravating factors. Sleep is suboptimal (5-6 hours per night or less).     UPDATE 06/23/15: Since last visit, doing well. HA are reduced. Only needed triptan 3-4 times in last 6 months. Tolerating TPX.    UPDATE 12/16/14: Since last visit 5-8 migraines, likely weather related. No other issues. Overall doing well.   UPDATE 08/10/14: Since last visit, doing better. Only 3 days of migraine since last visit. Tolerating TPX. Rizatriptan also working well. Overall doing well. MRI brain was normal. Still no specific triggers. Incidentally patient developed small rash on right perioral region 1 week ago, which started out as "cold sore". Patient denies any burning, numbness or pain with this rash. Rash seems to be stabilized and no longer expanding.   PRIOR HPI (06/25/14): 46 year old right-handed female here for evaluation of headaches, dizziness, visual changes. Since 2010 patient has had intermittent episodes of headache, frontal, occipital, sometimes unilateral pressure and throbbing sensation with nausea, photophobia,  proceeded by seeing spots and vision changes. She was having 1 or less episodes per month. Gradually these increased uptake 3-4 per month. In 2016 she has had 5 episodes. Recent episodes have changed to include severe dizzy, spinning sensation, lasting much longer than previously. Prior episodes used the last 1-2 hours at a time. Now episodes can last  5 hours up to 2-3 days at a time. No prior diagnosis of migraine. Patient's daughter has some headaches but not officially diagnosed. Patient denies any specific triggering or aggravating factors. No food, caffeine, hormonal, sleep or stress triggers. Patient has tried Motrin, BC powder, Tylenol, Aleve, Excedrin migraine in the past for headache treatment, with mild relief. Patient has remote history of Bell's palsy on the right side 2003 with incomplete recovery. Patient has had intermittent bilateral hand numbness in the past.   REVIEW OF SYSTEMS: Out of a complete 14 system review of symptoms, the patient complains only of the following symptoms, headaches, numbness of hands and all other reviewed systems are negative.  ALLERGIES: No Known Allergies  HOME MEDICATIONS: Outpatient Medications Prior to Visit  Medication Sig Dispense Refill   acetaminophen (TYLENOL) 500 MG tablet Take 1,000 mg by mouth every 6 (six) hours as needed for mild pain or fever.     albuterol (VENTOLIN HFA) 108 (90 Base) MCG/ACT inhaler Inhale 2 puffs into the lungs every 6 (six) hours as needed for wheezing or shortness of breath. NEEDS OFFICE VISIT BEFORE ANY FURTHER REFILLS 18 each 0   amitriptyline (ELAVIL) 50 MG tablet Take 1 tablet (50 mg total) by mouth at bedtime. 90 tablet 4   Apple Cider Vinegar 500 MG TABS Take 500 mg by mouth daily.     bisoprolol (ZEBETA) 5 MG tablet Take 1 tablet (5 mg total) by mouth daily. 90 tablet 1   cholecalciferol (VITAMIN D3) 25 MCG (1000 UT) tablet Take 1,000 Units by mouth daily.     fluticasone (FLONASE) 50 MCG/ACT nasal spray Place 2 sprays into both nostrils daily as needed for congestion.     Omega-3 Fatty Acids (FISH OIL) 1000 MG CAPS Take 1,000 mg by mouth daily.      phentermine (ADIPEX-P) 37.5 MG tablet Take 37.5 mg by mouth daily.  3   polyethylene glycol (MIRALAX / GLYCOLAX) 17 g packet Take 17 g by mouth daily as needed for moderate constipation. 14 each 0   silver  sulfADIAZINE (SILVADENE) 1 % cream Apply 1 application topically daily. 50 g 0   triamterene-hydrochlorothiazide (MAXZIDE-25) 37.5-25 MG tablet Take 1 tablet by mouth daily. Use as needed for leg swelling 90 tablet 1   vitamin B-12 (CYANOCOBALAMIN) 1000 MCG tablet Take 1,000 mcg by mouth daily.     vitamin C (ASCORBIC ACID) 500 MG tablet Take 1,000 mg by mouth daily.      Fremanezumab-vfrm (AJOVY) 225 MG/1.5ML SOAJ Inject 225 mg into the skin every 30 (thirty) days. 4.5 mL 0   Rimegepant Sulfate (NURTEC) 75 MG TBDP Take 75 mg by mouth daily as needed (take for abortive therapy of migraine, no more than 1 tablet in 24 hours or 10 per month). 10 tablet 11   topiramate (TOPAMAX) 50 MG tablet Take 1 tablet (50 mg total) by mouth 2 (two) times daily. 180 tablet 4   amoxicillin-clavulanate (AUGMENTIN) 875-125 MG tablet Take 1 tablet by mouth 2 (two) times daily. 20 tablet 0   No facility-administered medications prior to visit.    PAST MEDICAL HISTORY: Past Medical History:  Diagnosis Date  Allergy    Anxiety    H/O Bell's palsy    rt sided   Hypertension    Migraine aura, persistent     PAST SURGICAL HISTORY: Past Surgical History:  Procedure Laterality Date   CESAREAN SECTION  2010    FAMILY HISTORY: Family History  Problem Relation Age of Onset   CAD Paternal Grandfather 38   CAD Paternal Grandmother 80   Diabetes Maternal Grandmother    Diabetes Maternal Grandfather    Hypertension Brother     SOCIAL HISTORY: Social History   Socioeconomic History   Marital status: Married    Spouse name: Thayer Ohm   Number of children: 3   Years of education: 12   Highest education level: Not on file  Occupational History   Not on file  Tobacco Use   Smoking status: Never   Smokeless tobacco: Never  Substance and Sexual Activity   Alcohol use: No   Drug use: No   Sexual activity: Yes    Comment: married  Other Topics Concern   Not on file  Social History Narrative   Works as a  Scientist, research (medical).  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     Social Determinants of Health   Financial Resource Strain: Not on file  Food Insecurity: Not on file  Transportation Needs: Not on file  Physical Activity: Not on file  Stress: Not on file  Social Connections: Not on file  Intimate Partner Violence: Not on file      PHYSICAL EXAM  Vitals:   02/15/21 1511  BP: 118/75  Pulse: 95  Weight: 204 lb (92.5 kg)  Height: 5\' 5"  (1.651 m)    Body mass index is 33.95 kg/m.  Generalized: Well developed, in no acute distress  Cardiology: normal rate and rhythm, no murmur noted Respiratory: Clear to auscultation bilaterally Neurological examination  Mentation: Alert oriented to time, place, history taking. Follows all commands speech and language fluent Cranial nerve II-XII: Pupils were equal round reactive to light. Extraocular movements were full, visual field were full on confrontational test. Facial sensation and strength were normal. Head turning and shoulder shrug  were normal and symmetric. Motor: The motor testing reveals 5 over 5 strength of all 4 extremities. Good symmetric motor tone is noted throughout.  Sensory: Sensory testing is intact to soft touch on all 4 extremities. No evidence of extinction is noted.  Coordination: Cerebellar testing reveals good finger-nose-finger and heel-to-shin bilaterally.  Gait and station: Gait is normal.   DIAGNOSTIC DATA (LABS, IMAGING, TESTING) - I reviewed patient records, labs, notes, testing and imaging myself where available.  No flowsheet data found.   Lab Results  Component Value Date   WBC 9.4 05/11/2019   HGB 14.4 05/11/2019   HCT 42.6 05/11/2019   MCV 78.7 05/11/2019   PLT 307.0 05/11/2019      Component Value Date/Time   NA 139 01/04/2020 1031   K 4.0 01/04/2020 1031   CL 104 01/04/2020 1031   CO2 25 01/04/2020 1031   GLUCOSE 167 (H) 01/04/2020 1031   BUN 13 01/04/2020 1031   CREATININE 0.72  01/04/2020 1031   CREATININE 0.82 11/12/2019 1615   CALCIUM 9.0 01/04/2020 1031   PROT 7.3 05/11/2019 1621   ALBUMIN 4.2 05/11/2019 1621   AST 15 05/11/2019 1621   ALT 15 05/11/2019 1621   ALKPHOS 46 05/11/2019 1621   BILITOT 0.3 05/11/2019 1621   GFRNONAA >60 01/07/2019 0441   GFRAA >60 01/07/2019 0441  Lab Results  Component Value Date   CHOL 156 05/11/2019   HDL 42.70 05/11/2019   LDLCALC 79 05/11/2019   TRIG 171.0 (H) 05/11/2019   CHOLHDL 4 05/11/2019   Lab Results  Component Value Date   HGBA1C 6.1 (H) 11/12/2019   No results found for: VITAMINB12 Lab Results  Component Value Date   TSH 3.35 05/11/2019       ASSESSMENT AND PLAN 46 y.o. year old female  has a past medical history of Allergy, Anxiety, H/O Bell's palsy, Hypertension, and Migraine aura, persistent. here with     ICD-10-CM   1. Migraine without status migrainosus, not intractable, unspecified migraine type  G43.909 Fremanezumab-vfrm (AJOVY) 225 MG/1.5ML SOAJ    2. Migraine with aura and without status migrainosus, not intractable  G43.109 Fremanezumab-vfrm (AJOVY) 225 MG/1.5ML SOAJ       Carlei She is tolerating Topiramate, Ajovy, and amitriptyline.  An we will continue topiramate 50 mg twice daily and amitriptyline 50 mg at bedtime.  She was educated on appropriate administration and storage of this medication.  Side effects discussed. She will continue Nurtec as needed for abortive therapy. We discussed that she can add Tylenol and Ibuprofen for additional assistance with migraines. She was advised to stay well-hydrated, work on a well-balanced diet and try to exercise regularly. She will follow-up closely with primary care. She will follow-up with me in 1 year, sooner if needed.  She verbalizes understanding and agreement with this plan.   No orders of the defined types were placed in this encounter.    Meds ordered this encounter  Medications   Rimegepant Sulfate (NURTEC) 75 MG TBDP    Sig:  Take 75 mg by mouth daily as needed (take for abortive therapy of migraine, no more than 1 tablet in 24 hours or 10 per month).    Dispense:  10 tablet    Refill:  11   topiramate (TOPAMAX) 50 MG tablet    Sig: Take 1 tablet (50 mg total) by mouth 2 (two) times daily.    Dispense:  180 tablet    Refill:  4    Order Specific Question:   Supervising Provider    Answer:   Anson Fret [[id number]]   Fremanezumab-vfrm (AJOVY) 225 MG/1.5ML SOAJ    Sig: Inject 225 mg into the skin every 30 (thirty) days.    Dispense:  4.5 mL    Refill:  3    Order Specific Question:   Supervising Provider    Answer:   Anson Fret [[id number]]     KVQ QVZDG, FNP-C 02/15/2021, 3:56 PM [facility] [address], [address] [place], [place] [address] [phone]

## 2021-02-14 NOTE — Patient Instructions (Addendum)
Below is our plan:  We will continue topiramate 50 mg twice daily, amitriptyline 50 mg at bedtime, Ajovy monthly, and Nurtec as needed for abortive therapy. As discussed, try adding Tylenol and Ibuprofen for additional assistance with migraines.   Please make sure you are staying well hydrated. I recommend 50-60 ounces daily. Well balanced diet and regular exercise encouraged. Consistent sleep schedule with 6-8 hours recommended.   Please continue follow up with care team as directed.   Follow up with me in 1 year or sooner if needed.  You may receive a survey regarding today's visit. I encourage you to leave honest feed back as I do use this information to improve patient care. Thank you for seeing me today!

## 2021-02-15 ENCOUNTER — Other Ambulatory Visit: Payer: Self-pay

## 2021-02-15 ENCOUNTER — Ambulatory Visit: Payer: BC Managed Care – PPO | Admitting: Family Medicine

## 2021-02-15 ENCOUNTER — Encounter: Payer: Self-pay | Admitting: Family Medicine

## 2021-02-15 VITALS — BP 118/75 | HR 95 | Ht 65.0 in | Wt 204.0 lb

## 2021-02-15 DIAGNOSIS — G43909 Migraine, unspecified, not intractable, without status migrainosus: Secondary | ICD-10-CM

## 2021-02-15 DIAGNOSIS — G43109 Migraine with aura, not intractable, without status migrainosus: Secondary | ICD-10-CM | POA: Diagnosis not present

## 2021-02-15 MED ORDER — AJOVY 225 MG/1.5ML ~~LOC~~ SOAJ: 225.0000 mg | SUBCUTANEOUS | 3 refills | Status: DC

## 2021-02-15 MED ORDER — NURTEC 75 MG PO TBDP: 75.0000 mg | ORAL_TABLET | Freq: Every day | ORAL | 11 refills | Status: DC | PRN

## 2021-02-15 MED ORDER — TOPIRAMATE 50 MG PO TABS: 50.0000 mg | ORAL_TABLET | Freq: Two times a day (BID) | ORAL | 4 refills | Status: DC

## 2021-02-19 ENCOUNTER — Other Ambulatory Visit: Payer: Self-pay | Admitting: Family Medicine

## 2021-02-19 ENCOUNTER — Other Ambulatory Visit: Payer: Self-pay | Admitting: Neurology

## 2021-02-19 DIAGNOSIS — G43909 Migraine, unspecified, not intractable, without status migrainosus: Secondary | ICD-10-CM

## 2021-02-20 NOTE — Telephone Encounter (Signed)
Rx refilled.

## 2021-04-03 ENCOUNTER — Other Ambulatory Visit: Payer: Self-pay | Admitting: Family Medicine

## 2021-04-03 DIAGNOSIS — Z1231 Encounter for screening mammogram for malignant neoplasm of breast: Secondary | ICD-10-CM

## 2021-04-06 ENCOUNTER — Other Ambulatory Visit: Payer: Self-pay | Admitting: Family Medicine

## 2021-04-06 ENCOUNTER — Ambulatory Visit
Admission: RE | Admit: 2021-04-06 | Discharge: 2021-04-06 | Disposition: A | Discharge status: Home / Self Care | Payer: BC Managed Care – PPO | Source: Ambulatory Visit

## 2021-04-06 DIAGNOSIS — Z1231 Encounter for screening mammogram for malignant neoplasm of breast: Secondary | ICD-10-CM

## 2021-04-06 DIAGNOSIS — I10 Essential (primary) hypertension: Secondary | ICD-10-CM

## 2021-04-09 NOTE — Progress Notes (Addendum)
[facility] conservation officer at [employer] ?[address], [address] ?[place], [place] [address] ?336 [phone] ?Fax [phone] ? ?Date:  04/13/2021  ? ?Name:  Kylie Galloway   DOB:  06-20-1975   MRN:  [id number] ? ?PCP:  Kylie Cables, MD  ? ? ?Chief Complaint: Annual Exam (Cpe- Not fasting //Weight gain over the last 2 months ) ? ? ?History of Present Illness: ? ?Kylie Galloway is a 46 y.o. very pleasant female patient who presents with the following: ? ?Here today for a CPE ?Last seen by myself 11/21- History of migraine headache, hypertension, prediabetes ?She sees neurology for migraine: ?Kylie Galloway She is tolerating Topiramate, Ajovy, and amitriptyline.  An we will continue topiramate 50 mg twice daily and amitriptyline 50 mg at bedtime.  She was educated on appropriate administration and storage of this medication.  Side effects discussed. She will continue Nurtec as needed for abortive therapy. We discussed that she can add Tylenol and Ibuprofen for additional assistance with migraines. ? ?She notes that her HA are under reasonable control- worse when she is under stress  ?Pap may be due- will do at GYN  ?Covid booster ?Hep C screening can be done - do today  ?She is not fasting today  ?Still has mirena in place  ? ?Lab Results  ?Component Value Date  ? HGBA1C 6.1 (H) 11/12/2019  ? ?Pt had GDM with her youngest, she is concerned that her glucose might be high  ?She has gained some weight- she is not sure why  ?She is watching her diet  ?Her schedule is variable which can be stressful  ?She is frustrated by weight gain ?She does not have any contraindication for GLP and would be interested in using this option for weight loss  ? ?Wt Readings from Last 3 Encounters:  ?04/13/21 217 lb 6.4 oz (98.6 kg)  ?02/15/21 204 lb (92.5 kg)  ?02/15/20 210 lb (95.3 kg)  ? ? ? ?Patient Active Problem List  ? Diagnosis Date Noted  ? Prediabetes 11/11/2019  ? Pneumonia due to COVID-19 virus 01/02/2019  ? Respiratory failure with  hypoxia (HCC) 01/02/2019  ? Ingrown toenail 10/26/2017  ? Migraine 12/25/2015  ? Hypertension 01/24/2012  ? ? ?Past Medical History:  ?Diagnosis Date  ? Allergy   ? Anxiety   ? H/O Bell's palsy   ? rt sided  ? Hypertension   ? Migraine aura, persistent   ? ? ?Past Surgical History:  ?Procedure Laterality Date  ? BREAST BIOPSY Right 02/25/2020  ? CESAREAN SECTION  01/09/2008  ? ? ?Social History  ? ?Tobacco Use  ? Smoking status: Never  ? Smokeless tobacco: Never  ?Substance Use Topics  ? Alcohol use: No  ? Drug use: No  ? ? ?Family History  ?Problem Relation Age of Onset  ? CAD Paternal Grandfather 87  ? CAD Paternal Grandmother 68  ? Diabetes Maternal Grandmother   ? Diabetes Maternal Grandfather   ? Hypertension Brother   ? ? ?No Known Allergies ? ?Medication list has been reviewed and updated. ? ?Current Outpatient Medications on File Prior to Visit  ?Medication Sig Dispense Refill  ? acetaminophen (TYLENOL) 500 MG tablet Take 1,000 mg by mouth every 6 (six) hours as needed for mild pain or fever.    ? albuterol (VENTOLIN HFA) 108 (90 Base) MCG/ACT inhaler INHALE 2 PUFFS INTO THE LUNGS EVERY 6 HRS AS NEEDED FOR WHEEZING OR SHORTNESS OF BREATH. NEED TO SEE DOC BEFORE  ADDITIONAL REFILLS 18 each 0  ? amitriptyline (ELAVIL) 50 MG tablet TAKE 1 TABLET BY MOUTH EVERYDAY AT BEDTIME 90 tablet 4  ? Apple Cider Vinegar 500 MG TABS Take 500 mg by mouth daily.    ? bisoprolol (ZEBETA) 5 MG tablet TAKE 1 TABLET (5 MG TOTAL) BY MOUTH DAILY. 90 tablet 1  ? cholecalciferol (VITAMIN D3) 25 MCG (1000 UT) tablet Take 1,000 Units by mouth daily.    ? fluticasone (FLONASE) 50 MCG/ACT nasal spray Place 2 sprays into both nostrils daily as needed for congestion.    ? Fremanezumab-vfrm (AJOVY) 225 MG/1.5ML SOAJ Inject 225 mg into the skin every 30 (thirty) days. 4.5 mL 3  ? levonorgestrel (MIRENA, 52 MG,) 20 MCG/DAY IUD Mirena 21 mcg/24 hours (8 yrs) 52 mg intrauterine device ? Take 1 device by intrauterine route.    ? Omega-3 Fatty  Acids (FISH OIL) 1000 MG CAPS Take 1,000 mg by mouth daily.     ? phentermine (ADIPEX-P) 37.5 MG tablet Take 37.5 mg by mouth daily.  3  ? polyethylene glycol (MIRALAX / GLYCOLAX) 17 g packet Take 17 g by mouth daily as needed for moderate constipation. 14 each 0  ? Rimegepant Sulfate (NURTEC) 75 MG TBDP Take 75 mg by mouth daily as needed (take for abortive therapy of migraine, no more than 1 tablet in 24 hours or 10 per month). 10 tablet 11  ? silver sulfADIAZINE (SILVADENE) 1 % cream Apply 1 application topically daily. 50 g 0  ? topiramate (TOPAMAX) 50 MG tablet Take 1 tablet (50 mg total) by mouth 2 (two) times daily. 180 tablet 4  ? triamterene-hydrochlorothiazide (MAXZIDE-25) 37.5-25 MG tablet TAKE 1 TABLET BY MOUTH DAILY. USE AS NEEDED FOR LEG SWELLING 90 tablet 1  ? vitamin B-12 (CYANOCOBALAMIN) 1000 MCG tablet Take 1,000 mcg by mouth daily.    ? vitamin C (ASCORBIC ACID) 500 MG tablet Take 1,000 mg by mouth daily.     ? ?No current facility-administered medications on file prior to visit.  ? ? ?Review of Systems: ? ?As per HPI- otherwise negative. ? ? ?Physical Examination: ?Vitals:  ? 04/13/21 1434  ?BP: 128/78  ?Pulse: 86  ?Temp: 98 ?F (36.7 ?C)  ?SpO2: 98%  ? ?Vitals:  ? 04/13/21 1434  ?Weight: 217 lb 6.4 oz (98.6 kg)  ?Height:  (1.651 m)  ? ?Body mass index is 36.18 kg/m?. ?Ideal Body Weight: Weight in (lb) to have BMI = 25: 149.9 ? ?GEN: no acute distress. Obese, looks well  ?HEENT: Atraumatic, Normocephalic.  Bilateral TM wnl, oropharynx normal.  PEERL,EOMI.   ?Ears and Nose: No external deformity. ?CV: RRR, No M/G/R. No JVD. No thrill. No extra heart sounds. ?PULM: CTA B, no wheezes, crackles, rhonchi. No retractions. No resp. distress. No accessory muscle use. ?ABD: S, NT, ND. No rebound. No HSM. ?EXTR: No c/c/e ?PSYCH: Normally interactive. Conversant.  ?Right shin- well demarcated patch of dermatitis, appears spongiotic in type  ? ?Assessment and Plan: ?Prediabetes - Plan: Comprehensive  metabolic panel, Hemoglobin A1c, CANCELED: Comprehensive metabolic panel, CANCELED: Hemoglobin A1c ? ?Screening for hyperlipidemia - Plan: Lipid panel, CANCELED: Lipid panel ? ?Screening for deficiency anemia - Plan: CBC, CANCELED: CBC ? ?Essential hypertension - Plan: CBC, Comprehensive metabolic panel, CANCELED: CBC, CANCELED: Comprehensive metabolic panel ? ?Screening for thyroid disorder - Plan: TSH, CANCELED: TSH ? ?Encounter for hepatitis C screening test for low risk patient - Plan: Hepatitis C antibody, CANCELED: Hepatitis C antibody ? ?Dermatitis - Plan: triamcinolone ointment (KENALOG)  0.5 % ? ?Obesity (BMI 35.0-39.9 without comorbidity) - Plan: Semaglutide-Weight Management (WEGOVY) 0.25 MG/0.5ML SOAJ ?Seen today for a CPE ?Encouraged healthy diet and exercise routine ?Triamcinolone as needed for dermatitis on right shin ?Rx for [facility]- pt will let me know if she cannot get this covered  ? ?Signed ?Lamar Blinks, MD ?Received labs 4/8 message to pt ? ?Results for orders placed or performed in visit on 04/13/21  ?CBC  ?Result Value Ref Range  ? WBC 11.0 (H) 3.8 - 10.8 Thousand/uL  ? RBC 5.05 3.80 - 5.10 Million/uL  ? Hemoglobin 13.6 11.7 - 15.5 g/dL  ? HCT 40.7 35.0 - 45.0 %  ? MCV 80.6 80.0 - 100.0 fL  ? MCH 26.9 (L) 27.0 - 33.0 pg  ? MCHC 33.4 32.0 - 36.0 g/dL  ? RDW 13.6 11.0 - 15.0 %  ? Platelets 276 140 - 400 Thousand/uL  ? MPV 10.1 7.5 - 12.5 fL  ?Comprehensive metabolic panel  ?Result Value Ref Range  ? Glucose, Bld 160 (H) 65 - 99 mg/dL  ? BUN 11 7 - 25 mg/dL  ? Creat 0.87 0.50 - 0.99 mg/dL  ? BUN/Creatinine Ratio NOT APPLICABLE 6 - 22 (calc)  ? Sodium 140 135 - 146 mmol/L  ? Potassium 3.4 (L) 3.5 - 5.3 mmol/L  ? Chloride 103 98 - 110 mmol/L  ? CO2 24 20 - 32 mmol/L  ? Calcium 9.3 8.6 - 10.2 mg/dL  ? Total Protein 6.7 6.1 - 8.1 g/dL  ? Albumin 3.9 3.6 - 5.1 g/dL  ? Globulin 2.8 1.9 - 3.7 g/dL (calc)  ? AG Ratio 1.4 1.0 - 2.5 (calc)  ? Total Bilirubin 0.3 0.2 - 1.2 mg/dL  ? Alkaline phosphatase  (APISO) 79 31 - 125 U/L  ? AST 13 10 - 30 U/L  ? ALT 14 6 - 29 U/L  ?Hemoglobin A1c  ?Result Value Ref Range  ? Hgb A1c MFr Bld 6.4 (H) <5.7 % of total Hgb  ? Mean Plasma Glucose 137 mg/dL  ? eAG (mmol/L) 7.6

## 2021-04-09 NOTE — Patient Instructions (Addendum)
Good to see you today - I will be in touch with your labs asap ?Try the steroid ointment as needed for the rash on your leg ?We will try Rx Wegovy for weight loss- let me know if you are not able to get this filled ?This is a GLP-1 drug; you can also check with your insurance to see what is covered for weight loss (some plans only cover for diabetes treatment  ?

## 2021-04-10 ENCOUNTER — Other Ambulatory Visit: Payer: Self-pay | Admitting: Family Medicine

## 2021-04-10 DIAGNOSIS — R928 Other abnormal and inconclusive findings on diagnostic imaging of breast: Secondary | ICD-10-CM

## 2021-04-11 ENCOUNTER — Other Ambulatory Visit: Payer: Self-pay | Admitting: Family Medicine

## 2021-04-13 ENCOUNTER — Ambulatory Visit (INDEPENDENT_AMBULATORY_CARE_PROVIDER_SITE_OTHER): Payer: BC Managed Care – PPO | Admitting: Family Medicine

## 2021-04-13 ENCOUNTER — Encounter: Payer: Self-pay | Admitting: Family Medicine

## 2021-04-13 ENCOUNTER — Encounter: Payer: BC Managed Care – PPO | Admitting: Family Medicine

## 2021-04-13 ENCOUNTER — Telehealth: Payer: Self-pay

## 2021-04-13 VITALS — BP 128/78 | HR 86 | Temp 98.0°F | Ht 65.0 in | Wt 217.4 lb

## 2021-04-13 DIAGNOSIS — Z1159 Encounter for screening for other viral diseases: Secondary | ICD-10-CM

## 2021-04-13 DIAGNOSIS — R7303 Prediabetes: Secondary | ICD-10-CM

## 2021-04-13 DIAGNOSIS — Z13220 Encounter for screening for lipoid disorders: Secondary | ICD-10-CM | POA: Diagnosis not present

## 2021-04-13 DIAGNOSIS — Z130 Encounter for screening for diseases of the blood and blood-forming organs and certain disorders involving the immune mechanism: Secondary | ICD-10-CM | POA: Diagnosis not present

## 2021-04-13 DIAGNOSIS — E669 Obesity, unspecified: Secondary | ICD-10-CM

## 2021-04-13 DIAGNOSIS — R635 Abnormal weight gain: Secondary | ICD-10-CM

## 2021-04-13 DIAGNOSIS — Z1329 Encounter for screening for other suspected endocrine disorder: Secondary | ICD-10-CM

## 2021-04-13 DIAGNOSIS — I10 Essential (primary) hypertension: Secondary | ICD-10-CM

## 2021-04-13 DIAGNOSIS — L309 Dermatitis, unspecified: Secondary | ICD-10-CM

## 2021-04-13 MED ORDER — TRIAMCINOLONE ACETONIDE 0.5 % EX OINT: 1.0000 "application " | TOPICAL_OINTMENT | Freq: Two times a day (BID) | CUTANEOUS | 0 refills | Status: DC

## 2021-04-13 MED ORDER — WEGOVY 0.25 MG/0.5ML ~~LOC~~ SOAJ: 0.2500 mg | SUBCUTANEOUS | 2 refills | Status: DC

## 2021-04-13 NOTE — Telephone Encounter (Signed)
PA initiated via Covermymeds; KEY: [id number]. Awaiting determination.  ?

## 2021-04-14 LAB — COMPREHENSIVE METABOLIC PANEL
AG Ratio: 1.4 (calc) (ref 1.0–2.5)
ALT: 14 U/L (ref 6–29)
AST: 13 U/L (ref 10–30)
Albumin: 3.9 g/dL (ref 3.6–5.1)
Alkaline phosphatase (APISO): 79 U/L (ref 31–125)
BUN: 11 mg/dL (ref 7–25)
CO2: 24 mmol/L (ref 20–32)
Calcium: 9.3 mg/dL (ref 8.6–10.2)
Chloride: 103 mmol/L (ref 98–110)
Creat: 0.87 mg/dL (ref 0.50–0.99)
Globulin: 2.8 g/dL (calc) (ref 1.9–3.7)
Glucose, Bld: 160 mg/dL — ABNORMAL HIGH (ref 65–99)
Potassium: 3.4 mmol/L — ABNORMAL LOW (ref 3.5–5.3)
Sodium: 140 mmol/L (ref 135–146)
Total Bilirubin: 0.3 mg/dL (ref 0.2–1.2)
Total Protein: 6.7 g/dL (ref 6.1–8.1)

## 2021-04-14 LAB — HEPATITIS C ANTIBODY
Hepatitis C Ab: NONREACTIVE
SIGNAL TO CUT-OFF: 0.1 (ref <1.00)

## 2021-04-14 LAB — CBC
HCT: 40.7 % (ref 35.0–45.0)
Hemoglobin: 13.6 g/dL (ref 11.7–15.5)
MCH: 26.9 pg — ABNORMAL LOW (ref 27.0–33.0)
MCHC: 33.4 g/dL (ref 32.0–36.0)
MCV: 80.6 fL (ref 80.0–100.0)
MPV: 10.1 fL (ref 7.5–12.5)
Platelets: 276 10*3/uL (ref 140–400)
RBC: 5.05 10*6/uL (ref 3.80–5.10)
RDW: 13.6 % (ref 11.0–15.0)
WBC: 11 10*3/uL — ABNORMAL HIGH (ref 3.8–10.8)

## 2021-04-14 LAB — LIPID PANEL
Cholesterol: 174 mg/dL (ref <200)
HDL: 36 mg/dL — ABNORMAL LOW (ref >=50)
LDL Cholesterol (Calc): 105 mg/dL (calc) — ABNORMAL HIGH
Non-HDL Cholesterol (Calc): 138 mg/dL (calc) — ABNORMAL HIGH (ref <130)
Total CHOL/HDL Ratio: 4.8 (calc) (ref <5.0)
Triglycerides: 211 mg/dL — ABNORMAL HIGH (ref <150)

## 2021-04-14 LAB — HEMOGLOBIN A1C
Hgb A1c MFr Bld: 6.4 % of total Hgb — ABNORMAL HIGH (ref <5.7)
Mean Plasma Glucose: 137 mg/dL
eAG (mmol/L): 7.6 mmol/L

## 2021-04-14 LAB — TSH: TSH: 2.34 mIU/L

## 2021-04-15 ENCOUNTER — Encounter: Payer: Self-pay | Admitting: Family Medicine

## 2021-04-17 NOTE — Telephone Encounter (Signed)
PA approved. Effective 04/13/2021 - 11/13/2021 ?

## 2021-04-19 ENCOUNTER — Ambulatory Visit
Admission: RE | Admit: 2021-04-19 | Discharge: 2021-04-19 | Disposition: A | Discharge status: Home / Self Care | Payer: BC Managed Care – PPO | Source: Ambulatory Visit | Attending: Family Medicine | Admitting: Family Medicine

## 2021-04-19 DIAGNOSIS — R928 Other abnormal and inconclusive findings on diagnostic imaging of breast: Secondary | ICD-10-CM

## 2021-05-04 ENCOUNTER — Other Ambulatory Visit: Payer: Self-pay | Admitting: Family Medicine

## 2021-05-04 ENCOUNTER — Encounter: Payer: Self-pay | Admitting: Family Medicine

## 2021-05-04 MED ORDER — SEMAGLUTIDE-WEIGHT MANAGEMENT 1 MG/0.5ML ~~LOC~~ SOAJ: 1.0000 mg | SUBCUTANEOUS | 3 refills | Status: DC

## 2021-05-08 ENCOUNTER — Other Ambulatory Visit: Payer: Self-pay | Admitting: *Deleted

## 2021-05-08 ENCOUNTER — Encounter: Payer: Self-pay | Admitting: Family Medicine

## 2021-05-08 DIAGNOSIS — G43109 Migraine with aura, not intractable, without status migrainosus: Secondary | ICD-10-CM

## 2021-05-08 DIAGNOSIS — G43909 Migraine, unspecified, not intractable, without status migrainosus: Secondary | ICD-10-CM

## 2021-05-08 MED ORDER — AJOVY 225 MG/1.5ML ~~LOC~~ SOAJ: 225.0000 mg | SUBCUTANEOUS | 3 refills | Status: DC

## 2021-05-18 LAB — RESULTS CONSOLE HPV: CHL HPV: NEGATIVE

## 2021-05-18 LAB — HM PAP SMEAR

## 2021-06-04 ENCOUNTER — Other Ambulatory Visit: Payer: Self-pay | Admitting: Family Medicine

## 2021-06-04 ENCOUNTER — Encounter: Payer: Self-pay | Admitting: Family Medicine

## 2021-06-05 MED ORDER — WEGOVY 1.7 MG/0.75ML ~~LOC~~ SOAJ: 1.7000 mg | SUBCUTANEOUS | 2 refills | Status: DC

## 2021-06-07 MED ORDER — WEGOVY 1.7 MG/0.75ML ~~LOC~~ SOAJ: 1.7000 mg | SUBCUTANEOUS | 2 refills | Status: DC

## 2021-06-07 NOTE — Addendum Note (Signed)
Addended by: Lamar Blinks C on: 06/07/2021 09:10 PM   Modules accepted: Orders

## 2021-06-08 ENCOUNTER — Ambulatory Visit: Payer: BC Managed Care – PPO | Admitting: Podiatry

## 2021-06-08 DIAGNOSIS — B070 Plantar wart: Secondary | ICD-10-CM

## 2021-06-08 NOTE — Patient Instructions (Signed)
Take dressing off in 8 hours and wash the foot with soap and water. If it is hurting or becomes uncomfortable before the 8 hours, go ahead and remove the bandage and wash the area.  If it blisters, apply antibiotic ointment and a band-aid.  Monitor for any signs/symptoms of infection. Call the office immediately if any occur or go directly to the emergency room. Call with any questions/concerns.   

## 2021-06-10 NOTE — Progress Notes (Signed)
Subjective:   Patient ID: Kylie Galloway, female   DOB: 46 y.o.   MRN: [id number]   HPI 46 year old female presents the office today with concerns of a wart, skin lesion above her left heel which are about 4 months ago.  She tried over-the-counter wart remover pads without any improvement.  Area is tender with walking.  No drainage or pus or any swelling.  No other concerns.   Review of Systems  All other systems reviewed and are negative.  Past Medical History:  Diagnosis Date   Allergy    Anxiety    H/O Bell's palsy    rt sided   Hypertension    Migraine aura, persistent     Past Surgical History:  Procedure Laterality Date   BREAST BIOPSY Right 02/25/2020   CESAREAN SECTION  01/09/2008     Current Outpatient Medications:    acetaminophen (TYLENOL) 500 MG tablet, Take 1,000 mg by mouth every 6 (six) hours as needed for mild pain or fever., Disp: , Rfl:    albuterol (VENTOLIN HFA) 108 (90 Base) MCG/ACT inhaler, INHALE 2 PUFFS INTO THE LUNGS EVERY 6 HRS AS NEEDED FOR WHEEZING OR SHORTNESS OF BREATH. MAKE APPT., Disp: 18 each, Rfl: 0   amitriptyline (ELAVIL) 50 MG tablet, TAKE 1 TABLET BY MOUTH EVERYDAY AT BEDTIME, Disp: 90 tablet, Rfl: 4   Apple Cider Vinegar 500 MG TABS, Take 500 mg by mouth daily., Disp: , Rfl:    bisoprolol (ZEBETA) 5 MG tablet, TAKE 1 TABLET (5 MG TOTAL) BY MOUTH DAILY., Disp: 90 tablet, Rfl: 1   cholecalciferol (VITAMIN D3) 25 MCG (1000 UT) tablet, Take 1,000 Units by mouth daily., Disp: , Rfl:    fluticasone (FLONASE) 50 MCG/ACT nasal spray, Place 2 sprays into both nostrils daily as needed for congestion., Disp: , Rfl:    Fremanezumab-vfrm (AJOVY) 225 MG/1.5ML SOAJ, Inject 225 mg into the skin every 30 (thirty) days., Disp: 4.5 mL, Rfl: 3   levonorgestrel (MIRENA, 52 MG,) 20 MCG/DAY IUD, Mirena 21 mcg/24 hours (8 yrs) 52 mg intrauterine device  Take 1 device by intrauterine route., Disp: , Rfl:    Omega-3 Fatty Acids (FISH OIL) 1000 MG CAPS, Take 1,000 mg  by mouth daily. , Disp: , Rfl:    polyethylene glycol (MIRALAX / GLYCOLAX) 17 g packet, Take 17 g by mouth daily as needed for moderate constipation., Disp: 14 each, Rfl: 0   Rimegepant Sulfate (NURTEC) 75 MG TBDP, Take 75 mg by mouth daily as needed (take for abortive therapy of migraine, no more than 1 tablet in 24 hours or 10 per month)., Disp: 10 tablet, Rfl: 11   Semaglutide-Weight Management (WEGOVY) 1.7 MG/0.75ML SOAJ, Inject 1.7 mg into the skin once a week., Disp: 3 mL, Rfl: 2   silver sulfADIAZINE (SILVADENE) 1 % cream, Apply 1 application topically daily., Disp: 50 g, Rfl: 0   topiramate (TOPAMAX) 50 MG tablet, Take 1 tablet (50 mg total) by mouth 2 (two) times daily., Disp: 180 tablet, Rfl: 4   triamcinolone ointment (KENALOG) 0.5 %, Apply 1 application. topically 2 (two) times daily., Disp: 30 g, Rfl: 0   triamterene-hydrochlorothiazide (MAXZIDE-25) 37.5-25 MG tablet, TAKE 1 TABLET BY MOUTH DAILY. USE AS NEEDED FOR LEG SWELLING, Disp: 90 tablet, Rfl: 1   vitamin B-12 (CYANOCOBALAMIN) 1000 MCG tablet, Take 1,000 mcg by mouth daily., Disp: , Rfl:    vitamin C (ASCORBIC ACID) 500 MG tablet, Take 1,000 mg by mouth daily. , Disp: , Rfl:   No  Known Allergies       Objective:  Physical Exam  General: AAO x3, NAD  Dermatological: On the plantar aspect of the left heel on the medial aspect is a hyperkeratotic lesion.  Upon debridement there is pinpoint bleeding evidence of verruca.  No evidence of foreign body.  No edema, erythema, drainage or pus.  No signs of infection.  Vascular: Dorsalis Pedis artery and Posterior Tibial artery pedal pulses are 2/4 bilateral with immedate capillary fill time.  There is no pain with calf compression, swelling, warmth, erythema.   Neruologic: Grossly intact via light touch bilateral.   Musculoskeletal: Tenderness the skin lesion but no other areas of discomfort.  Muscular strength 5/5 in all groups tested bilateral.  Gait: Unassisted, Nonantalgic.        Assessment:   Left foot verruca  Plan:  -Treatment options discussed including all alternatives, risks, and complications -Etiology of symptoms were discussed -Sharply to the hyperkeratotic lesion with any complications or bleeding revealed underlying verruca.  Skin was cleaned with alcohol.  Cantharone Plus was applied followed by an occlusive bandage.  Postprocedure instructions discussed.  Monitor for any signs or symptoms of infection.  Vivi Barrack DPM

## 2021-06-22 ENCOUNTER — Telehealth: Payer: BC Managed Care – PPO | Admitting: Family Medicine

## 2021-06-22 DIAGNOSIS — J019 Acute sinusitis, unspecified: Secondary | ICD-10-CM | POA: Diagnosis not present

## 2021-06-22 DIAGNOSIS — B9689 Other specified bacterial agents as the cause of diseases classified elsewhere: Secondary | ICD-10-CM | POA: Diagnosis not present

## 2021-06-22 MED ORDER — FLUCONAZOLE 150 MG PO TABS: 150.0000 mg | ORAL_TABLET | Freq: Once | ORAL | 0 refills | Status: AC

## 2021-06-22 MED ORDER — AMOXICILLIN-POT CLAVULANATE 875-125 MG PO TABS: 1.0000 | ORAL_TABLET | Freq: Two times a day (BID) | ORAL | 0 refills | Status: AC

## 2021-06-22 NOTE — Progress Notes (Signed)

## 2021-06-29 MED ORDER — WEGOVY 2.4 MG/0.75ML ~~LOC~~ SOAJ
2.4000 mg | SUBCUTANEOUS | 1 refills | Status: DC
Start: 2021-06-29 — End: 2021-12-12

## 2021-06-29 NOTE — Addendum Note (Signed)
Addended by: Abbe Amsterdam C on: 06/29/2021 07:16 AM   Modules accepted: Orders

## 2021-07-01 ENCOUNTER — Other Ambulatory Visit: Payer: Self-pay | Admitting: Family Medicine

## 2021-07-20 ENCOUNTER — Ambulatory Visit: Payer: BC Managed Care – PPO | Admitting: Podiatry

## 2021-07-20 DIAGNOSIS — B070 Plantar wart: Secondary | ICD-10-CM

## 2021-07-20 NOTE — Patient Instructions (Signed)
Take dressing off in 8 hours and wash the foot with soap and water. If it is hurting or becomes uncomfortable before the 8 hours, go ahead and remove the bandage and wash the area.  If it blisters, apply antibiotic ointment and a band-aid.  Monitor for any signs/symptoms of infection. Call the office immediately if any occur or go directly to the emergency room. Call with any questions/concerns.   

## 2021-07-23 NOTE — Progress Notes (Signed)
Subjective: 46 year old female presents the office today for evaluation of wart, skin lesion left heel.  She states is doing much better.  She did not have any complications or issues at the last treatment with the [facility].  Still some discomfort but overall doing better. No other concerns.  Objective: AAO x3, NAD DP/PT pulses palpable bilaterally, CRT less than 3 seconds On the left heel medial aspect is a small hyperkeratotic tissue.  Small amount of capillary bleeding, bleeding is present but overall appears to be much improved and almost resolved.  There is no surrounding erythema, ascending cellulitis.  No drainage or pus.  No new lesions. No pain with calf compression, swelling, warmth, erythema  Assessment: Left heel verruca  Plan: -All treatment options discussed with the patient including all alternatives, risks, complications.  -I sharply debrided hyperkeratotic tissue without any complications.  Skin was with alcohol allowed to dry.  Cantharone Plus was applied followed by a bandage.  Postprocedure instructions discussed.  Monitor for any signs or symptoms of infection. -Patient encouraged to call the office with any questions, concerns, change in symptoms.   Return if symptoms worsen or fail to improve.-Discussed if no improvement let me know and I can order a cream to help with the wart or have her come back in for another treatment.  Vivi Barrack DPM

## 2021-07-27 ENCOUNTER — Other Ambulatory Visit: Payer: Self-pay | Admitting: Family Medicine

## 2021-08-08 NOTE — Progress Notes (Signed)
[facility] [address], [address] [place], [place] [address] [phone]  Date:  08/14/2021   Name:  Kylie Galloway   DOB:  1975/10/20   MRN:  [id number]  PCP:  Pearline Cables, MD    Chief Complaint: Weight Check (Taking Wegovy./Concerns/ questions: none/)   History of Present Illness:  Kylie Galloway is a 46 y.o. very pleasant female patient who presents with the following:  Following up today to check her progress on Wegovy-  History of migraine headache, obesity, hypertension, prediabetes Most recent visit with myself was in April-at that time her BMI was 36, she wished to try [facility] for weight loss She is now on the 2.4 mg  She notes that SE have gotten much better- she has more sx the first day or so after she takes her shot but overall manageable She is down about 25 lbs She is weighing in once a week  Her only other concern is a rash on her bilateral anterior shins.  It has been present for the past 8 months, will wax and wane.  She is using triamcinolone ointment which does help but then will return.  No other rash on her body  Would like to check her A1c today   Wt Readings from Last 3 Encounters:  08/14/21 192 lb 6.4 oz (87.3 kg)  04/13/21 217 lb 6.4 oz (98.6 kg)  02/15/21 204 lb (92.5 kg)     Patient Active Problem List   Diagnosis Date Noted   Prediabetes 11/11/2019   Pneumonia due to COVID-19 virus 01/02/2019   Respiratory failure with hypoxia (HCC) 01/02/2019   Ingrown toenail 10/26/2017   Migraine 12/25/2015   Hypertension 01/24/2012    Past Medical History:  Diagnosis Date   Allergy    Anxiety    H/O Bell's palsy    rt sided   Hypertension    Migraine aura, persistent     Past Surgical History:  Procedure Laterality Date   BREAST BIOPSY Right 02/25/2020   CESAREAN SECTION  01/09/2008    Social History   Tobacco Use   Smoking status: Never   Smokeless tobacco: Never  Substance Use  Topics   Alcohol use: No   Drug use: No    Family History  Problem Relation Age of Onset   CAD Paternal Grandfather 42   CAD Paternal Grandmother 33   Diabetes Maternal Grandmother    Diabetes Maternal Grandfather    Hypertension Brother     No Known Allergies  Medication list has been reviewed and updated.  Current Outpatient Medications on File Prior to Visit  Medication Sig Dispense Refill   acetaminophen (TYLENOL) 500 MG tablet Take 1,000 mg by mouth every 6 (six) hours as needed for mild pain or fever.     albuterol (VENTOLIN HFA) 108 (90 Base) MCG/ACT inhaler INHALE 2 PUFFS INTO THE LUNGS EVERY 6 HRS AS NEEDED FOR WHEEZING OR SHORTNESS OF BREATH. MAKE APPT. 18 each 0   amitriptyline (ELAVIL) 50 MG tablet TAKE 1 TABLET BY MOUTH EVERYDAY AT BEDTIME 90 tablet 4   Apple Cider Vinegar 500 MG TABS Take 500 mg by mouth daily.     bisoprolol (ZEBETA) 5 MG tablet TAKE 1 TABLET (5 MG TOTAL) BY MOUTH DAILY. 90 tablet 1   cholecalciferol (VITAMIN D3) 25 MCG (1000 UT) tablet Take 1,000 Units by mouth daily.     fluticasone (FLONASE) 50 MCG/ACT nasal spray Place 2  sprays into both nostrils daily as needed for congestion.     Fremanezumab-vfrm (AJOVY) 225 MG/1.5ML SOAJ Inject 225 mg into the skin every 30 (thirty) days. 4.5 mL 3   levonorgestrel (MIRENA, 52 MG,) 20 MCG/DAY IUD Mirena 21 mcg/24 hours (8 yrs) 52 mg intrauterine device  Take 1 device by intrauterine route.     Omega-3 Fatty Acids (FISH OIL) 1000 MG CAPS Take 1,000 mg by mouth daily.      polyethylene glycol (MIRALAX / GLYCOLAX) 17 g packet Take 17 g by mouth daily as needed for moderate constipation. 14 each 0   Rimegepant Sulfate (NURTEC) 75 MG TBDP Take 75 mg by mouth daily as needed (take for abortive therapy of migraine, no more than 1 tablet in 24 hours or 10 per month). 10 tablet 11   Semaglutide-Weight Management (WEGOVY) 2.4 MG/0.75ML SOAJ Inject 2.4 mg into the skin once a week. 9 mL 1   silver sulfADIAZINE (SILVADENE)  1 % cream Apply 1 application topically daily. 50 g 0   topiramate (TOPAMAX) 50 MG tablet Take 1 tablet (50 mg total) by mouth 2 (two) times daily. 180 tablet 4   triamcinolone ointment (KENALOG) 0.5 % Apply 1 application. topically 2 (two) times daily. 30 g 0   triamterene-hydrochlorothiazide (MAXZIDE-25) 37.5-25 MG tablet TAKE 1 TABLET BY MOUTH DAILY. USE AS NEEDED FOR LEG SWELLING 90 tablet 1   vitamin B-12 (CYANOCOBALAMIN) 1000 MCG tablet Take 1,000 mcg by mouth daily.     vitamin C (ASCORBIC ACID) 500 MG tablet Take 1,000 mg by mouth daily.      No current facility-administered medications on file prior to visit.    Review of Systems:  As per HPI- otherwise negative.   Physical Examination: Vitals:   08/14/21 1550  BP: 110/70  Pulse: 91  Resp: 18  Temp: 97.6 F (36.4 C)  SpO2: 98%   Vitals:   08/14/21 1550  Weight: 192 lb 6.4 oz (87.3 kg)  Height: 5\' 5"  (1.651 m)   Body mass index is 32.02 kg/m. Ideal Body Weight: Weight in (lb) to have BMI = 25: 149.9  GEN: no acute distress.  Obese but has lost, looks well HEENT: Atraumatic, Normocephalic.  Ears and Nose: No external deformity. CV: RRR, No M/G/R. No JVD. No thrill. No extra heart sounds. PULM: CTA B, no wheezes, crackles, rhonchi. No retractions. No resp. distress. No accessory muscle use. ABD: S, NT, ND EXTR: No c/c/e PSYCH: Normally interactive. Conversant.  Rash on anterior shins    Assessment and Plan: Essential hypertension - Plan: Basic metabolic panel  Prediabetes - Plan: Hemoglobin A1c  Obesity (BMI 35.0-39.9 without comorbidity)  Rash in adult - Plan: Ambulatory referral to Dermatology  Patient seen today for follow-up.  She is losing weight with Wegovy, tolerating well.  I asked her let me know when she needs a refill Blood pressure looks good on current regimen Check an A1c today Referral dermatology.  In the meantime continue triamcinolone as needed.  Let me know if getting worse Plan to  follow-up in 4 to 6 months  Signed 12-08-1989, MD

## 2021-08-08 NOTE — Patient Instructions (Addendum)
It was good to see you again today!  I will be in touch with your labs Let me know when you need more Wegovy  Assuming all is well let's recheck in 4-6 months

## 2021-08-14 ENCOUNTER — Ambulatory Visit: Payer: BC Managed Care – PPO | Admitting: Family Medicine

## 2021-08-14 VITALS — BP 110/70 | HR 91 | Temp 97.6°F | Resp 18 | Ht 65.0 in | Wt 192.4 lb

## 2021-08-14 DIAGNOSIS — I10 Essential (primary) hypertension: Secondary | ICD-10-CM

## 2021-08-14 DIAGNOSIS — R21 Rash and other nonspecific skin eruption: Secondary | ICD-10-CM

## 2021-08-14 DIAGNOSIS — R7303 Prediabetes: Secondary | ICD-10-CM

## 2021-08-14 DIAGNOSIS — E669 Obesity, unspecified: Secondary | ICD-10-CM

## 2021-08-15 ENCOUNTER — Encounter: Payer: Self-pay | Admitting: Family Medicine

## 2021-08-15 LAB — BASIC METABOLIC PANEL
BUN: 17 mg/dL (ref 6–23)
CO2: 30 mEq/L (ref 19–32)
Calcium: 9.7 mg/dL (ref 8.4–10.5)
Chloride: 100 mEq/L (ref 96–112)
Creatinine, Ser: 0.84 mg/dL (ref 0.40–1.20)
GFR: 84.96 mL/min (ref >60.00)
Glucose, Bld: 106 mg/dL — ABNORMAL HIGH (ref 70–99)
Potassium: 3.9 mEq/L (ref 3.5–5.1)
Sodium: 140 mEq/L (ref 135–145)

## 2021-08-15 LAB — HEMOGLOBIN A1C: Hgb A1c MFr Bld: 5.4 % (ref 4.6–6.5)

## 2021-08-17 ENCOUNTER — Other Ambulatory Visit: Payer: Self-pay | Admitting: Family Medicine

## 2021-10-11 ENCOUNTER — Other Ambulatory Visit: Payer: Self-pay | Admitting: Family Medicine

## 2021-10-11 DIAGNOSIS — I10 Essential (primary) hypertension: Secondary | ICD-10-CM

## 2021-11-06 ENCOUNTER — Ambulatory Visit: Payer: BC Managed Care – PPO | Admitting: Family Medicine

## 2021-11-06 ENCOUNTER — Encounter: Payer: Self-pay | Admitting: Family Medicine

## 2021-11-06 VITALS — BP 100/70 | HR 88 | Temp 97.9°F | Resp 18 | Ht 65.0 in | Wt 178.6 lb

## 2021-11-06 DIAGNOSIS — J0140 Acute pansinusitis, unspecified: Secondary | ICD-10-CM | POA: Insufficient documentation

## 2021-11-06 MED ORDER — FLUCONAZOLE 150 MG PO TABS: ORAL_TABLET | ORAL | 0 refills | Status: DC

## 2021-11-06 MED ORDER — AMOXICILLIN-POT CLAVULANATE 875-125 MG PO TABS: 1.0000 | ORAL_TABLET | Freq: Two times a day (BID) | ORAL | 0 refills | Status: DC

## 2021-11-06 NOTE — Progress Notes (Addendum)
Subjective:   By signing my name below, I, Cassell Clement, attest that this documentation has been prepared under the direction and in the presence of Donato Schultz DO 11/06/2021   Patient ID: Kylie Galloway, female    DOB: 1975-12-12, 46 y.o.   MRN: [id number]  Chief Complaint  Patient presents with   Sore Throat    Pt states having sxs for 4-5 days, Pt states negative COVID yesterday and Friday. Pt reports head pressure, sore throat, teeth pain, Cough when laying down.     Sore Throat  Associated symptoms include congestion. Pertinent negatives include no abdominal pain, coughing, headaches or shortness of breath.   Patient is in today for an office visit  She complains of a sore throat, sinus pressure and cough. She states that symptoms begun around 11/02/2021 - 11/03/2021. She states that she does cough up thick green phlegm. She also has ear pressure but no pain. Her sinus pressure is primarily around her nose and slightly above her eyebrows. Her sore throat worsens when she swallows, she also has occasional burning in her throat. She has taken a Covid test on 11/03/2021 and 11/05/2021 and reports that results were negative. She also reports that she is UTD on her Covid vaccines. She has tried Tylenol, Dayquil and Nyqiul but symptoms are persistent. She denies of any fever at this time. She has taken Augmentin in the past.    Past Medical History:  Diagnosis Date   Allergy    Anxiety    H/O Bell's palsy    rt sided   Hypertension    Migraine aura, persistent     Past Surgical History:  Procedure Laterality Date   BREAST BIOPSY Right 02/25/2020   CESAREAN SECTION  01/09/2008    Family History  Problem Relation Age of Onset   CAD Paternal Grandfather 66   CAD Paternal Grandmother 31   Diabetes Maternal Grandmother    Diabetes Maternal Grandfather    Hypertension Brother     Social History   Socioeconomic History   Marital status: Married    Spouse name:  Thayer Ohm   Number of children: 3   Years of education: 12   Highest education level: Not on file  Occupational History   Not on file  Tobacco Use   Smoking status: Never   Smokeless tobacco: Never  Substance and Sexual Activity   Alcohol use: No   Drug use: No   Sexual activity: Yes    Comment: married  Other Topics Concern   Not on file  Social History Narrative   Works as a Scientist, research (medical).  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     Social Determinants of Health   Financial Resource Strain: Not on file  Food Insecurity: Not on file  Transportation Needs: Not on file  Physical Activity: Not on file  Stress: Not on file  Social Connections: Not on file  Intimate Partner Violence: Not on file    Outpatient Medications Prior to Visit  Medication Sig Dispense Refill   acetaminophen (TYLENOL) 500 MG tablet Take 1,000 mg by mouth every 6 (six) hours as needed for mild pain or fever.     albuterol (VENTOLIN HFA) 108 (90 Base) MCG/ACT inhaler INHALE 2 PUFFS INTO THE LUNGS EVERY 6 HRS AS NEEDED FOR WHEEZING OR SHORTNESS OF BREATH. MAKE APPT. 18 each 0   amitriptyline (ELAVIL) 50 MG tablet TAKE 1 TABLET BY MOUTH EVERYDAY AT BEDTIME 90 tablet 4  Apple Cider Vinegar 500 MG TABS Take 500 mg by mouth daily.     bisoprolol (ZEBETA) 5 MG tablet TAKE 1 TABLET (5 MG TOTAL) BY MOUTH DAILY. 90 tablet 1   cholecalciferol (VITAMIN D3) 25 MCG (1000 UT) tablet Take 1,000 Units by mouth daily.     fluticasone (FLONASE) 50 MCG/ACT nasal spray Place 2 sprays into both nostrils daily as needed for congestion.     Fremanezumab-vfrm (AJOVY) 225 MG/1.5ML SOAJ Inject 225 mg into the skin every 30 (thirty) days. 4.5 mL 3   levonorgestrel (MIRENA, 52 MG,) 20 MCG/DAY IUD Mirena 21 mcg/24 hours (8 yrs) 52 mg intrauterine device  Take 1 device by intrauterine route.     Omega-3 Fatty Acids (FISH OIL) 1000 MG CAPS Take 1,000 mg by mouth daily.      polyethylene glycol (MIRALAX / GLYCOLAX) 17 g packet  Take 17 g by mouth daily as needed for moderate constipation. 14 each 0   Rimegepant Sulfate (NURTEC) 75 MG TBDP Take 75 mg by mouth daily as needed (take for abortive therapy of migraine, no more than 1 tablet in 24 hours or 10 per month). 10 tablet 11   Semaglutide-Weight Management (WEGOVY) 2.4 MG/0.75ML SOAJ Inject 2.4 mg into the skin once a week. 9 mL 1   silver sulfADIAZINE (SILVADENE) 1 % cream Apply 1 application topically daily. 50 g 0   topiramate (TOPAMAX) 50 MG tablet Take 1 tablet (50 mg total) by mouth 2 (two) times daily. 180 tablet 4   triamcinolone ointment (KENALOG) 0.5 % Apply 1 application. topically 2 (two) times daily. 30 g 0   triamterene-hydrochlorothiazide (MAXZIDE-25) 37.5-25 MG tablet TAKE 1 TABLET BY MOUTH DAILY. USE AS NEEDED FOR LEG SWELLING 90 tablet 1   vitamin B-12 (CYANOCOBALAMIN) 1000 MCG tablet Take 1,000 mcg by mouth daily.     vitamin C (ASCORBIC ACID) 500 MG tablet Take 1,000 mg by mouth daily.      No facility-administered medications prior to visit.    No Known Allergies  Review of Systems  Constitutional:  Negative for chills, fever and malaise/fatigue.  HENT:  Positive for congestion, sinus pain and sore throat.        (+) Ear Pressure (+) Sinus Pressure  Eyes:  Negative for blurred vision.  Respiratory:  Positive for sputum production (Thick, Green). Negative for cough and shortness of breath.   Cardiovascular:  Negative for chest pain, palpitations and leg swelling.  Gastrointestinal: Negative.  Negative for abdominal pain, blood in stool and nausea.  Genitourinary:  Negative for dysuria and frequency.  Musculoskeletal:  Negative for falls.  Skin:  Negative for rash.  Neurological: Negative.  Negative for dizziness, loss of consciousness and headaches.  Endo/Heme/Allergies:  Negative for environmental allergies.  Psychiatric/Behavioral:  Negative for depression. The patient is not nervous/anxious.        Objective:    Physical  Exam Vitals and nursing note reviewed.  Constitutional:      General: She is not in acute distress.    Appearance: Normal appearance. She is not ill-appearing.  HENT:     Head: Normocephalic and atraumatic.     Comments: Maxillary Sinus Pressure  Left Frontal Sinus Pressure     Right Ear: Tympanic membrane, ear canal and external ear normal.     Left Ear: Tympanic membrane, ear canal and external ear normal.     Nose:     Right Sinus: Maxillary sinus tenderness present. No frontal sinus tenderness.     Left  Sinus: Maxillary sinus tenderness and frontal sinus tenderness present.     Mouth/Throat:     Pharynx: Posterior oropharyngeal erythema present.  Eyes:     Extraocular Movements: Extraocular movements intact.     Pupils: Pupils are equal, round, and reactive to light.  Cardiovascular:     Rate and Rhythm: Normal rate and regular rhythm.     Heart sounds: Normal heart sounds. No murmur heard.    No gallop.  Pulmonary:     Effort: Pulmonary effort is normal. No respiratory distress.     Breath sounds: Normal breath sounds. No wheezing or rales.  Lymphadenopathy:     Cervical: Cervical adenopathy present.  Skin:    General: Skin is warm and dry.  Neurological:     Mental Status: She is alert and oriented to person, place, and time.  Psychiatric:        Judgment: Judgment normal.     BP 100/70 (BP Location: Right Arm, Patient Position: Sitting, Cuff Size: Normal)   Pulse 88   Temp 97.9 F (36.6 C) (Oral)   Resp 18   Ht 5\' 5"  (1.651 m)   Wt 178 lb 9.6 oz (81 kg)   SpO2 99%   BMI 29.72 kg/m  Wt Readings from Last 3 Encounters:  11/06/21 178 lb 9.6 oz (81 kg)  08/14/21 192 lb 6.4 oz (87.3 kg)  04/13/21 217 lb 6.4 oz (98.6 kg)    Diabetic Foot Exam - Simple   No data filed    Lab Results  Component Value Date   WBC 11.0 (H) 04/13/2021   HGB 13.6 04/13/2021   HCT 40.7 04/13/2021   PLT 276 04/13/2021   GLUCOSE 106 (H) 08/14/2021   CHOL 174 04/13/2021   TRIG  211 (H) 04/13/2021   HDL 36 (L) 04/13/2021   LDLCALC 105 (H) 04/13/2021   ALT 14 04/13/2021   AST 13 04/13/2021   NA 140 08/14/2021   K 3.9 08/14/2021   CL 100 08/14/2021   CREATININE 0.84 08/14/2021   BUN 17 08/14/2021   CO2 30 08/14/2021   TSH 2.34 04/13/2021   INR 1.1 03/10/2008   HGBA1C 5.4 08/14/2021    Lab Results  Component Value Date   TSH 2.34 04/13/2021   Lab Results  Component Value Date   WBC 11.0 (H) 04/13/2021   HGB 13.6 04/13/2021   HCT 40.7 04/13/2021   MCV 80.6 04/13/2021   PLT 276 04/13/2021   Lab Results  Component Value Date   NA 140 08/14/2021   K 3.9 08/14/2021   CO2 30 08/14/2021   GLUCOSE 106 (H) 08/14/2021   BUN 17 08/14/2021   CREATININE 0.84 08/14/2021   BILITOT 0.3 04/13/2021   ALKPHOS 46 05/11/2019   AST 13 04/13/2021   ALT 14 04/13/2021   PROT 6.7 04/13/2021   ALBUMIN 4.2 05/11/2019   CALCIUM 9.7 08/14/2021   ANIONGAP 15 01/07/2019   GFR 84.96 08/14/2021   Lab Results  Component Value Date   CHOL 174 04/13/2021   Lab Results  Component Value Date   HDL 36 (L) 04/13/2021   Lab Results  Component Value Date   LDLCALC 105 (H) 04/13/2021   Lab Results  Component Value Date   TRIG 211 (H) 04/13/2021   Lab Results  Component Value Date   CHOLHDL 4.8 04/13/2021   Lab Results  Component Value Date   HGBA1C 5.4 08/14/2021       Assessment & Plan:   Problem List Items Addressed  This Visit       Unprioritized   Acute non-recurrent pansinusitis - Primary    abx per orders  flonase  Can add astepro if needed        Relevant Medications   amoxicillin-clavulanate (AUGMENTIN) 875-125 MG tablet   fluconazole (DIFLUCAN) 150 MG tablet   Meds ordered this encounter  Medications   amoxicillin-clavulanate (AUGMENTIN) 875-125 MG tablet    Sig: Take 1 tablet by mouth 2 (two) times daily.    Dispense:  20 tablet    Refill:  0   fluconazole (DIFLUCAN) 150 MG tablet    Sig: 1 po x1, may repeat in 3 days prn     Dispense:  2 tablet    Refill:  0    I, Donato Schultz, DO, personally preformed the services described in this documentation.  All medical record entries made by the scribe were at my direction and in my presence.  I have reviewed the chart and discharge instructions (if applicable) and agree that the record reflects my personal performance and is accurate and complete. 11/06/2021   I,Amber Collins,acting as a scribe for Donato Schultz, DO.,have documented all relevant documentation on the behalf of Donato Schultz, DO,as directed by  Donato Schultz, DO while in the presence of Donato Schultz, DO.    Donato Schultz, DO

## 2021-11-06 NOTE — Assessment & Plan Note (Signed)
abx per orders  flonase  Can add astepro if needed

## 2021-11-06 NOTE — Patient Instructions (Signed)

## 2021-11-10 ENCOUNTER — Encounter: Payer: Self-pay | Admitting: Family Medicine

## 2021-11-11 ENCOUNTER — Other Ambulatory Visit: Payer: Self-pay | Admitting: Family Medicine

## 2021-11-11 ENCOUNTER — Encounter: Payer: Self-pay | Admitting: Family Medicine

## 2021-11-11 MED ORDER — PHENTERMINE HCL 37.5 MG PO CAPS: 37.5000 mg | ORAL_CAPSULE | ORAL | 0 refills | Status: DC

## 2021-12-10 ENCOUNTER — Other Ambulatory Visit: Payer: Self-pay | Admitting: Family Medicine

## 2021-12-12 ENCOUNTER — Encounter: Payer: Self-pay | Admitting: Family Medicine

## 2021-12-12 ENCOUNTER — Telehealth: Payer: Self-pay

## 2021-12-12 NOTE — Telephone Encounter (Signed)
PA initiated  [facility] (Key: [id number])

## 2021-12-12 NOTE — Telephone Encounter (Signed)
PA approved. Effective 12/12/2021 - 12/13/2022.

## 2021-12-21 ENCOUNTER — Encounter: Payer: Self-pay | Admitting: Family

## 2021-12-21 ENCOUNTER — Ambulatory Visit (INDEPENDENT_AMBULATORY_CARE_PROVIDER_SITE_OTHER): Payer: BC Managed Care – PPO | Admitting: Family

## 2021-12-21 VITALS — BP 113/62 | HR 91 | Temp 97.9°F | Resp 18 | Ht 65.0 in | Wt 178.2 lb

## 2021-12-21 DIAGNOSIS — J0140 Acute pansinusitis, unspecified: Secondary | ICD-10-CM

## 2021-12-21 MED ORDER — DOXYCYCLINE HYCLATE 100 MG PO TABS: 100.0000 mg | ORAL_TABLET | Freq: Two times a day (BID) | ORAL | 0 refills | Status: DC

## 2021-12-21 MED ORDER — PREDNISONE 20 MG PO TABS: 20.0000 mg | ORAL_TABLET | Freq: Every day | ORAL | 0 refills | Status: DC

## 2021-12-21 NOTE — Progress Notes (Signed)
Kylie Galloway is a 46 y.o. female with the following history as recorded in EpicCare:  Patient Active Problem List   Diagnosis Date Noted   Acute non-recurrent pansinusitis 11/06/2021   Prediabetes 11/11/2019   Pneumonia due to COVID-19 virus 01/02/2019   Respiratory failure with hypoxia (HCC) 01/02/2019   Ingrown toenail 10/26/2017   Migraine 12/25/2015   Hypertension 01/24/2012    Current Outpatient Medications  Medication Sig Dispense Refill   acetaminophen (TYLENOL) 500 MG tablet Take 1,000 mg by mouth every 6 (six) hours as needed for mild pain or fever.     albuterol (VENTOLIN HFA) 108 (90 Base) MCG/ACT inhaler INHALE 2 PUFFS INTO THE LUNGS EVERY 6 HRS AS NEEDED FOR WHEEZING OR SHORTNESS OF BREATH. MAKE APPT. 18 each 0   amitriptyline (ELAVIL) 50 MG tablet TAKE 1 TABLET BY MOUTH EVERYDAY AT BEDTIME 90 tablet 4   Apple Cider Vinegar 500 MG TABS Take 500 mg by mouth daily.     bisoprolol (ZEBETA) 5 MG tablet TAKE 1 TABLET (5 MG TOTAL) BY MOUTH DAILY. 90 tablet 1   cholecalciferol (VITAMIN D3) 25 MCG (1000 UT) tablet Take 1,000 Units by mouth daily.     doxycycline (VIBRA-TABS) 100 MG tablet Take 1 tablet (100 mg total) by mouth 2 (two) times daily. 14 tablet 0   fluticasone (FLONASE) 50 MCG/ACT nasal spray Place 2 sprays into both nostrils daily as needed for congestion.     Fremanezumab-vfrm (AJOVY) 225 MG/1.5ML SOAJ Inject 225 mg into the skin every 30 (thirty) days. 4.5 mL 3   levonorgestrel (MIRENA, 52 MG,) 20 MCG/DAY IUD Mirena 21 mcg/24 hours (8 yrs) 52 mg intrauterine device  Take 1 device by intrauterine route.     Omega-3 Fatty Acids (FISH OIL) 1000 MG CAPS Take 1,000 mg by mouth daily.      phentermine 37.5 MG capsule Take 1 capsule (37.5 mg total) by mouth every morning. 4 capsule 0   polyethylene glycol (MIRALAX / GLYCOLAX) 17 g packet Take 17 g by mouth daily as needed for moderate constipation. 14 each 0   predniSONE (DELTASONE) 20 MG tablet Take 1 tablet (20 mg total)  by mouth daily with breakfast. 5 tablet 0   Rimegepant Sulfate (NURTEC) 75 MG TBDP Take 75 mg by mouth daily as needed (take for abortive therapy of migraine, no more than 1 tablet in 24 hours or 10 per month). 10 tablet 11   Semaglutide-Weight Management (WEGOVY) 2.4 MG/0.75ML SOAJ INJECT 2.4 MG INTO THE SKIN ONCE A WEEK. 3 mL 0   silver sulfADIAZINE (SILVADENE) 1 % cream Apply 1 application topically daily. 50 g 0   topiramate (TOPAMAX) 50 MG tablet Take 1 tablet (50 mg total) by mouth 2 (two) times daily. 180 tablet 4   triamcinolone ointment (KENALOG) 0.5 % Apply 1 application. topically 2 (two) times daily. 30 g 0   triamterene-hydrochlorothiazide (MAXZIDE-25) 37.5-25 MG tablet TAKE 1 TABLET BY MOUTH DAILY. USE AS NEEDED FOR LEG SWELLING 90 tablet 1   vitamin B-12 (CYANOCOBALAMIN) 1000 MCG tablet Take 1,000 mcg by mouth daily.     vitamin C (ASCORBIC ACID) 500 MG tablet Take 1,000 mg by mouth daily.      No current facility-administered medications for this visit.    Allergies: Patient has no known allergies.  Past Medical History:  Diagnosis Date   Allergy    Anxiety    H/O Bell's palsy    rt sided   Hypertension    Migraine aura, persistent  Past Surgical History:  Procedure Laterality Date   BREAST BIOPSY Right 02/25/2020   CESAREAN SECTION  01/09/2008    Family History  Problem Relation Age of Onset   CAD Paternal Grandfather 12   CAD Paternal Grandmother 80   Diabetes Maternal Grandmother    Diabetes Maternal Grandfather    Hypertension Brother     Social History   Tobacco Use   Smoking status: Never   Smokeless tobacco: Never  Substance Use Topics   Alcohol use: No    Subjective:   Concerned for sinus pain/ pressure x 1 week; + post-nasal drainage; has taken 2 negative home COVID tests; +nasal congestion;   LMP IUD    Objective:  Vitals:   12/21/21 1524  BP: 113/62  Pulse: 91  Resp: 18  Temp: 97.9 F (36.6 C)  TempSrc: Oral  SpO2: 100%   Weight: 178 lb 3.2 oz (80.8 kg)  Height: 5\' 5"  (1.651 m)    General: Well developed, well nourished, in no acute distress  Skin : Warm and dry.  Head: Normocephalic and atraumatic  Eyes: Sclera and conjunctiva clear; pupils round and reactive to light; extraocular movements intact  Ears: External normal; canals clear; tympanic membranes normal  Oropharynx: Pink, supple. No suspicious lesions  Neck: Supple without thyromegaly, adenopathy  Lungs: Respirations unlabored; clear to auscultation bilaterally without wheeze, rales, rhonchi  CVS exam: normal rate and regular rhythm.  Neurologic: Alert and oriented; speech intact; face symmetrical; moves all extremities well; CNII-XII intact without focal deficit   Assessment:  1. Acute non-recurrent pansinusitis     Plan:  Rx for Doxycycline, Prednisone; increase fluids, rest and follow up worse, no better.   No follow-ups on file.  No orders of the defined types were placed in this encounter.   Requested Prescriptions   Signed Prescriptions Disp Refills   doxycycline (VIBRA-TABS) 100 MG tablet 14 tablet 0    Sig: Take 1 tablet (100 mg total) by mouth 2 (two) times daily.   predniSONE (DELTASONE) 20 MG tablet 5 tablet 0    Sig: Take 1 tablet (20 mg total) by mouth daily with breakfast.

## 2021-12-22 ENCOUNTER — Encounter: Payer: Self-pay | Admitting: Family

## 2021-12-22 ENCOUNTER — Ambulatory Visit: Payer: BC Managed Care – PPO | Admitting: Family Medicine

## 2021-12-22 MED ORDER — ALBUTEROL SULFATE HFA 108 (90 BASE) MCG/ACT IN AERS: 2.0000 | INHALATION_SPRAY | Freq: Four times a day (QID) | RESPIRATORY_TRACT | 5 refills | Status: AC | PRN

## 2021-12-25 ENCOUNTER — Encounter: Payer: Self-pay | Admitting: Family

## 2021-12-26 ENCOUNTER — Other Ambulatory Visit: Payer: Self-pay | Admitting: Family

## 2021-12-26 MED ORDER — HYDROCOD POLI-CHLORPHE POLI ER 10-8 MG/5ML PO SUER: 5.0000 mL | Freq: Every evening | ORAL | 0 refills | Status: DC | PRN

## 2021-12-26 NOTE — Telephone Encounter (Signed)
Spoke with Pt she states that she still doesn't have as much pressure in her chest, the drainage is still bad, also states she still has a cough but does believe the cough is due to the drainage.

## 2021-12-27 ENCOUNTER — Other Ambulatory Visit: Payer: Self-pay | Admitting: Family

## 2021-12-27 MED ORDER — AZITHROMYCIN 250 MG PO TABS: ORAL_TABLET | ORAL | 0 refills | Status: DC

## 2021-12-27 MED ORDER — PREDNISONE 20 MG PO TABS: 20.0000 mg | ORAL_TABLET | Freq: Every day | ORAL | 0 refills | Status: DC

## 2022-01-12 NOTE — Progress Notes (Unsigned)
[place] [facility] [address] [place], [place] [address] [phone]  Date:  01/15/2022   Name:  Kylie Galloway   DOB:  Jun 18, 1975   MRN:  [id number]  PCP:  Darreld Mclean, MD    Chief Complaint: No chief complaint on file.   History of Present Illness:  Kylie Galloway is a 47 y.o. very pleasant female patient who presents with the following:  Patient seen today for periodic follow-up- History of migraine headache, obesity, hypertension, prediabetes  Most recent visit with myself was in August-at that time she was using [facility] for weight loss, she was on the 2.4 mg dose and had lost about 25 pounds so far  Pap smear screening Flu shot Offered COVID Mammogram is up-to-date Blood work done in [employer], A1c only Complete lab panel done in April Patient Active Problem List   Diagnosis Date Noted   Acute non-recurrent pansinusitis 11/06/2021   Prediabetes 11/11/2019   Pneumonia due to COVID-19 virus 01/02/2019   Respiratory failure with hypoxia ([place]) 01/02/2019   Ingrown toenail 10/26/2017   Migraine 12/25/2015   Hypertension 01/24/2012    Past Medical History:  Diagnosis Date   Allergy    Anxiety    H/O Bell's palsy    rt sided   Hypertension    Migraine aura, persistent     Past Surgical History:  Procedure Laterality Date   BREAST BIOPSY Right 02/25/2020   CESAREAN SECTION  01/09/2008    Social History   Tobacco Use   Smoking status: Never   Smokeless tobacco: Never  Substance Use Topics   Alcohol use: No   Drug use: No    Family History  Problem Relation Age of Onset   CAD Paternal Grandfather 107   CAD Paternal Grandmother 2   Diabetes Maternal Grandmother    Diabetes Maternal Grandfather    Hypertension Brother     No Known Allergies  Medication list has been reviewed and updated.  Current Outpatient Medications on File Prior to Visit  Medication Sig Dispense Refill    chlorpheniramine-HYDROcodone (TUSSIONEX) 10-8 MG/5ML Take 5 mLs by mouth at bedtime as needed for cough. 70 mL 0   acetaminophen (TYLENOL) 500 MG tablet Take 1,000 mg by mouth every 6 (six) hours as needed for mild pain or fever.     albuterol (VENTOLIN HFA) 108 (90 Base) MCG/ACT inhaler Inhale 2 puffs into the lungs every 6 (six) hours as needed for wheezing or shortness of breath. 18 g 5   amitriptyline (ELAVIL) 50 MG tablet TAKE 1 TABLET BY MOUTH EVERYDAY AT BEDTIME 90 tablet 4   Apple Cider Vinegar 500 MG TABS Take 500 mg by mouth daily.     azithromycin (ZITHROMAX Z-PAK) 250 MG tablet Take 2 tablets (500 mg) PO today, then 1 tablet (250 mg) PO daily x4 days. 6 tablet 0   bisoprolol (ZEBETA) 5 MG tablet TAKE 1 TABLET (5 MG TOTAL) BY MOUTH DAILY. 90 tablet 1   cholecalciferol (VITAMIN D3) 25 MCG (1000 UT) tablet Take 1,000 Units by mouth daily.     doxycycline (VIBRA-TABS) 100 MG tablet Take 1 tablet (100 mg total) by mouth 2 (two) times daily. 14 tablet 0   fluticasone (FLONASE) 50 MCG/ACT nasal spray Place 2 sprays into both nostrils daily as needed for congestion.     Fremanezumab-vfrm (AJOVY) 225 MG/1.5ML SOAJ Inject 225 mg into the skin every 30 (thirty) days. 4.5 mL 3  levonorgestrel (MIRENA, 52 MG,) 20 MCG/DAY IUD Mirena 21 mcg/24 hours (8 yrs) 52 mg intrauterine device  Take 1 device by intrauterine route.     Omega-3 Fatty Acids (FISH OIL) 1000 MG CAPS Take 1,000 mg by mouth daily.      phentermine 37.5 MG capsule Take 1 capsule (37.5 mg total) by mouth every morning. 4 capsule 0   polyethylene glycol (MIRALAX / GLYCOLAX) 17 g packet Take 17 g by mouth daily as needed for moderate constipation. 14 each 0   predniSONE (DELTASONE) 20 MG tablet Take 1 tablet (20 mg total) by mouth daily with breakfast. 5 tablet 0   predniSONE (DELTASONE) 20 MG tablet Take 1 tablet (20 mg total) by mouth daily with breakfast. 5 tablet 0   Rimegepant Sulfate (NURTEC) 75 MG TBDP Take 75 mg by mouth daily as  needed (take for abortive therapy of migraine, no more than 1 tablet in 24 hours or 10 per month). 10 tablet 11   Semaglutide-Weight Management (WEGOVY) 2.4 MG/0.75ML SOAJ INJECT 2.4 MG INTO THE SKIN ONCE A WEEK. 3 mL 0   silver sulfADIAZINE (SILVADENE) 1 % cream Apply 1 application topically daily. 50 g 0   topiramate (TOPAMAX) 50 MG tablet Take 1 tablet (50 mg total) by mouth 2 (two) times daily. 180 tablet 4   triamcinolone ointment (KENALOG) 0.5 % Apply 1 application. topically 2 (two) times daily. 30 g 0   triamterene-hydrochlorothiazide (MAXZIDE-25) 37.5-25 MG tablet TAKE 1 TABLET BY MOUTH DAILY. USE AS NEEDED FOR LEG SWELLING 90 tablet 1   vitamin B-12 (CYANOCOBALAMIN) 1000 MCG tablet Take 1,000 mcg by mouth daily.     vitamin C (ASCORBIC ACID) 500 MG tablet Take 1,000 mg by mouth daily.      No current facility-administered medications on file prior to visit.    Review of Systems:  As per HPI- otherwise negative.   Physical Examination: There were no vitals filed for this visit. There were no vitals filed for this visit. There is no height or weight on file to calculate BMI. Ideal Body Weight:    GEN: no acute distress. HEENT: Atraumatic, Normocephalic.  Ears and Nose: No external deformity. CV: RRR, No M/G/R. No JVD. No thrill. No extra heart sounds. PULM: CTA B, no wheezes, crackles, rhonchi. No retractions. No resp. distress. No accessory muscle use. ABD: S, NT, ND, +BS. No rebound. No HSM. EXTR: No c/c/e PSYCH: Normally interactive. Conversant.    Assessment and Plan: ***  Signed Lamar Blinks, MD

## 2022-01-12 NOTE — Patient Instructions (Incomplete)
It was great to see again today Recommend COVID booster if not done already- once you are well!   And flu shot if not done already  Omnicef antibiotic Prednisone  If this does not get your well please alert me!   Let's try decreasing your Wegovy to 1.7 got 1-2 months and then we can try going back up; this may help you jump start weight loss again

## 2022-01-15 ENCOUNTER — Ambulatory Visit: Payer: BC Managed Care – PPO | Admitting: Family Medicine

## 2022-01-15 VITALS — BP 112/62 | HR 95 | Temp 97.6°F | Resp 18 | Ht 65.0 in | Wt 178.4 lb

## 2022-01-15 DIAGNOSIS — J321 Chronic frontal sinusitis: Secondary | ICD-10-CM | POA: Diagnosis not present

## 2022-01-15 DIAGNOSIS — R7303 Prediabetes: Secondary | ICD-10-CM | POA: Diagnosis not present

## 2022-01-15 DIAGNOSIS — J209 Acute bronchitis, unspecified: Secondary | ICD-10-CM

## 2022-01-15 DIAGNOSIS — E669 Obesity, unspecified: Secondary | ICD-10-CM

## 2022-01-15 DIAGNOSIS — I10 Essential (primary) hypertension: Secondary | ICD-10-CM

## 2022-01-15 MED ORDER — CEFDINIR 300 MG PO CAPS: 300.0000 mg | ORAL_CAPSULE | Freq: Two times a day (BID) | ORAL | 0 refills | Status: DC

## 2022-01-15 MED ORDER — COVID-19 AT-HOME TEST VI KIT: PACK | 3 refills | Status: DC

## 2022-01-15 MED ORDER — WEGOVY 1.7 MG/0.75ML ~~LOC~~ SOAJ: 1.7000 mg | SUBCUTANEOUS | 1 refills | Status: DC

## 2022-01-15 MED ORDER — PREDNISONE 20 MG PO TABS: ORAL_TABLET | ORAL | 0 refills | Status: DC

## 2022-01-16 ENCOUNTER — Encounter: Payer: Self-pay | Admitting: Family Medicine

## 2022-01-16 LAB — CBC
HCT: 39.9 % (ref 36.0–46.0)
Hemoglobin: 13.3 g/dL (ref 12.0–15.0)
MCHC: 33.4 g/dL (ref 30.0–36.0)
MCV: 80.7 fl (ref 78.0–100.0)
Platelets: 290 10*3/uL (ref 150.0–400.0)
RBC: 4.94 Mil/uL (ref 3.87–5.11)
RDW: 14 % (ref 11.5–15.5)
WBC: 11.5 10*3/uL — ABNORMAL HIGH (ref 4.0–10.5)

## 2022-01-16 LAB — LIPID PANEL
Cholesterol: 178 mg/dL (ref 0–200)
HDL: 35.7 mg/dL — ABNORMAL LOW (ref >39.00)
LDL Cholesterol: 126 mg/dL — ABNORMAL HIGH (ref 0–99)
NonHDL: 142.37
Total CHOL/HDL Ratio: 5
Triglycerides: 84 mg/dL (ref 0.0–149.0)
VLDL: 16.8 mg/dL (ref 0.0–40.0)

## 2022-01-16 LAB — COMPREHENSIVE METABOLIC PANEL
ALT: 10 U/L (ref 0–35)
AST: 18 U/L (ref 0–37)
Albumin: 4.1 g/dL (ref 3.5–5.2)
Alkaline Phosphatase: 61 U/L (ref 39–117)
BUN: 9 mg/dL (ref 6–23)
CO2: 27 mEq/L (ref 19–32)
Calcium: 9 mg/dL (ref 8.4–10.5)
Chloride: 105 mEq/L (ref 96–112)
Creatinine, Ser: 0.81 mg/dL (ref 0.40–1.20)
GFR: 88.49 mL/min (ref >60.00)
Glucose, Bld: 102 mg/dL — ABNORMAL HIGH (ref 70–99)
Potassium: 4.2 mEq/L (ref 3.5–5.1)
Sodium: 139 mEq/L (ref 135–145)
Total Bilirubin: 0.8 mg/dL (ref 0.2–1.2)
Total Protein: 7.1 g/dL (ref 6.0–8.3)

## 2022-01-16 LAB — TSH: TSH: 1.89 u[IU]/mL (ref 0.35–5.50)

## 2022-01-16 LAB — HEMOGLOBIN A1C: Hgb A1c MFr Bld: 5.4 % (ref 4.6–6.5)

## 2022-01-17 ENCOUNTER — Other Ambulatory Visit: Payer: Self-pay | Admitting: Family Medicine

## 2022-02-11 ENCOUNTER — Other Ambulatory Visit: Payer: Self-pay | Admitting: Family Medicine

## 2022-02-12 ENCOUNTER — Other Ambulatory Visit: Payer: Self-pay | Admitting: Family Medicine

## 2022-02-12 ENCOUNTER — Encounter: Payer: Self-pay | Admitting: Family Medicine

## 2022-02-12 DIAGNOSIS — E669 Obesity, unspecified: Secondary | ICD-10-CM

## 2022-02-12 MED ORDER — FLUTICASONE PROPIONATE 50 MCG/ACT NA SUSP: 2.0000 | Freq: Every day | NASAL | 11 refills | Status: DC

## 2022-02-12 MED ORDER — TRIAMCINOLONE ACETONIDE 55 MCG/ACT NA AERO: 2.0000 | INHALATION_SPRAY | Freq: Every day | NASAL | 12 refills | Status: DC

## 2022-02-12 MED ORDER — WEGOVY 2.4 MG/0.75ML ~~LOC~~ SOAJ: 2.4000 mg | SUBCUTANEOUS | 2 refills | Status: DC

## 2022-02-26 NOTE — Patient Instructions (Incomplete)
Below is our plan:  We will ***  Please make sure you are staying well hydrated. I recommend 50-60 ounces daily. Well balanced diet and regular exercise encouraged. Consistent sleep schedule with 6-8 hours recommended.   Please continue follow up with care team as directed.   Follow up with *** in ***  You may receive a survey regarding today's visit. I encourage you to leave honest feed back as I do use this information to improve patient care. Thank you for seeing me today!   Migraine with aura: There is increased risk for stroke in women with migraine with aura and a contraindication for the combined contraceptive pill for use by women who have migraine with aura. The risk for women with migraine without aura is lower. However other risk factors like smoking are far more likely to increase stroke risk than migraine. There is a recommendation for no smoking and for the use of OCPs without estrogen such as progestogen only pills particularly for women with migraine with aura.Kylie Galloway People who have migraine headaches with auras may be 3 times more likely to have a stroke caused by a blood clot, compared to migraine patients who don't see auras. Women who take hormone-replacement therapy may be 30 percent more likely to suffer a clot-based stroke than women not taking medication containing estrogen. Other risk factors like smoking and high blood pressure may be  much more important.   Magnesium: may help with headaches are aura, the best evidence for magnesium is for migraine with aura is its thought to stop the cortical spreading depression we believe is the pathophysiology of migraine aura.  To prevent or relieve headaches, try the following: Cool Compress. Lie down and place a cool compress on your head.  Avoid headache triggers. If certain foods or odors seem to have triggered your migraines in the past, avoid them. A headache diary might help you identify triggers.  Include physical activity in  your daily routine. Try a daily walk or other moderate aerobic exercise.  Manage stress. Find healthy ways to cope with the stressors, such as delegating tasks on your to-do list.  Practice relaxation techniques. Try deep breathing, yoga, massage and visualization.  Eat regularly. Eating regularly scheduled meals and maintaining a healthy diet might help prevent headaches. Also, drink plenty of fluids.  Follow a regular sleep schedule. Sleep deprivation might contribute to headaches Consider biofeedback. With this mind-body technique, you learn to control certain bodily functions -- such as muscle tension, heart rate and blood pressure -- to prevent headaches or reduce headache pain.      Proceed to emergency room if you experience new or worsening symptoms or symptoms do not resolve, if you have new neurologic symptoms or if headache is severe, or for any concerning symptom.

## 2022-02-26 NOTE — Progress Notes (Deleted)
PATIENT: Kylie Galloway DOB: 08-17-1975  REASON FOR VISIT: follow up HISTORY FROM: patient  Virtual Visit via Telephone Note  I connected with Kylie Galloway on 02/26/22 at  8:30 AM EST by telephone and verified that I am speaking with the correct person using two identifiers.   I discussed the limitations, risks, security and privacy concerns of performing an evaluation and management service by telephone and the availability of in person appointments. I also discussed with the patient that there may be a patient responsible charge related to this service. The patient expressed understanding and agreed to proceed.   History of Present Illness:  02/26/22 ALL ([place]): Kylie Galloway is a 47 y.o. female here today for follow up for migraines. She continues Ajovy, topiramate 37m BID and amitriptyline 586mat bedtime. Nurtec helps with abortive therapy.   02/15/21 ALL: Kylie Galloway returns for follow up for migraines. She continues Ajovy, topiramate 5069mID and amitriptyline 68m75mS. Nurtec used for abortive therapy. She reports having 4-5 headaches in a month. The Nurtec is helpful for her most of the time.    02/15/2020 SS:  Kylie Galloway 44 y14r old female with history of migraine headaches.  She remains on amitriptyline and Topamax. Ajovy was added at her last visit due to worsening headaches following Covid.   Headaches are much improved, Ajovy has been quite helpful.  Has a few more migraines during the last week of the month, into the first week of her injection.  She does great weeks 2 and 3, hardly any headaches.  Stress and weather are triggers.  Nurtec works great for abortive therapy.  Gets 10 tablets a month, never uses her whole supply.  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  No changes to overall health.  Here today for evaluation unaccompanied.   04/06/2019 ALL: Kylie LAGRASSAa 45 y62. female here today for follow up for migraines. She continues  amitriptyline 68mg1mbedtime and topiramate 68mg 63m She was diagnosed with Covid in December and hospitalized for 7 days with pneumonia. She reports that headaches have worsened since. She is having near daily headaches with 10-12 migrainous days each month. She does have black spots in vision prior to headaches. She has used rizatriptan with some relief but this makes her sleepy.    She is planning to see ortho in April for carpal tunnel. She is wearing wrist braces without much benefit.     HISTORY: (copied from Dr PenumaGladstone Lighteron 10/06/2018)   UPDATE (10/06/18, VRP): Since last visit, doing well. Symptoms are stable. Severity is mild. No alleviating or aggravating factors. Tolerating meds. Only 1-2 HA per month. Weather and allergies can aggravate HA.    UPDATE (09/30/17, VRP): Since last visit, doing well. Symptoms are mild. Severity is mild. No alleviating or aggravating factors. Tolerating meds. Avg 1 HA per month in general. Increasing numbness in hands and feet.   UPDATE (03/25/17, VRP): Since last visit, was doing well until 3-4 months ago; now increasing HA 10-12 per month. Tolerating meds. No alleviating or aggravating factors. Sleep is suboptimal (5-6 hours per night or less).     UPDATE 06/23/15: Since last visit, doing well. HA are reduced. Only needed triptan 3-4 times in last 6 months. Tolerating TPX.    UPDATE 12/16/14: Since last visit 5-8 migraines, likely weather related. No other issues. Overall doing well.   UPDATE 08/10/14: Since last visit, doing better. Only 3 days of migraine  since last visit. Tolerating TPX. Rizatriptan also working well. Overall doing well. MRI brain was normal. Still no specific triggers. Incidentally patient developed small rash on right perioral region 1 week ago, which started out as "cold sore". Patient denies any burning, numbness or pain with this rash. Rash seems to be stabilized and no longer expanding.   PRIOR HPI (06/25/14): 47 year old  right-handed female here for evaluation of headaches, dizziness, visual changes. Since 2010 patient has had intermittent episodes of headache, frontal, occipital, sometimes unilateral pressure and throbbing sensation with nausea, photophobia, proceeded by seeing spots and vision changes. She was having 1 or less episodes per month. Gradually these increased uptake 3-4 per month. In 2016 she has had 5 episodes. Recent episodes have changed to include severe dizzy, spinning sensation, lasting much longer than previously. Prior episodes used the last 1-2 hours at a time. Now episodes can last 5 hours up to 2-3 days at a time. No prior diagnosis of migraine. Patient's daughter has some headaches but not officially diagnosed. Patient denies any specific triggering or aggravating factors. No food, caffeine, hormonal, sleep or stress triggers. Patient has tried Motrin, BC powder, Tylenol, Aleve, Excedrin migraine in the past for headache treatment, with mild relief. Patient has remote history of Bell's palsy on the right side 2003 with incomplete recovery. Patient has had intermittent bilateral hand numbness in the past.   Observations/Objective:  Generalized: Well developed, in no acute distress  Mentation: Alert oriented to time, place, history taking. Follows all commands speech and language fluent   Assessment and Plan:  47 y.o. year old female  has a past medical history of Allergy, Anxiety, H/O Bell's palsy, Hypertension, and Migraine aura, persistent. here with  No diagnosis found.  Ereka is doing well. We will continue Ajovy injections every 30 days, topiramate 69m BID, amitriptyline 558mQHS and Nurtec 7584maily as needed. Healthy lifestyle habits encouraged. She will follow up in 1 year, sooner if needed.   No orders of the defined types were placed in this encounter.   No orders of the defined types were placed in this encounter.    Follow Up Instructions:  I discussed the assessment  and treatment plan with the patient. The patient was provided an opportunity to ask questions and all were answered. The patient agreed with the plan and demonstrated an understanding of the instructions.   The patient was advised to call back or seek an in-person evaluation if the symptoms worsen or if the condition fails to improve as anticipated.  I provided *** minutes of non-face-to-face time during this encounter. Patient located at their place of residence during [place]. Provider is in the office.    AmyDebbora PrestoP

## 2022-02-27 ENCOUNTER — Telehealth: Payer: BC Managed Care – PPO | Admitting: Family Medicine

## 2022-02-27 DIAGNOSIS — G43109 Migraine with aura, not intractable, without status migrainosus: Secondary | ICD-10-CM

## 2022-02-27 DIAGNOSIS — G43909 Migraine, unspecified, not intractable, without status migrainosus: Secondary | ICD-10-CM

## 2022-03-07 ENCOUNTER — Encounter: Payer: Self-pay | Admitting: Family Medicine

## 2022-03-07 ENCOUNTER — Telehealth: Payer: BC Managed Care – PPO | Admitting: Family Medicine

## 2022-03-07 ENCOUNTER — Telehealth (INDEPENDENT_AMBULATORY_CARE_PROVIDER_SITE_OTHER): Payer: BC Managed Care – PPO | Admitting: Family Medicine

## 2022-03-07 DIAGNOSIS — G43909 Migraine, unspecified, not intractable, without status migrainosus: Secondary | ICD-10-CM

## 2022-03-07 MED ORDER — EMGALITY 120 MG/ML ~~LOC~~ SOAJ: 120.0000 mg | SUBCUTANEOUS | 3 refills | Status: DC

## 2022-03-07 NOTE — Patient Instructions (Signed)
Below is our plan:  We will switch Ajovy to Emgality injections every 30 days. Continue topiramate '50mg'$  twice daily and amitriptyline '50mg'$  at bedtime. Continue Nurtec as needed. Let me know if you have any trouble getting medicaitons.   Please make sure you are staying well hydrated. I recommend 50-60 ounces daily. Well balanced diet and regular exercise encouraged. Consistent sleep schedule with 6-8 hours recommended.   Please continue follow up with care team as directed.   Follow up with me in 6-12 months   You may receive a survey regarding today's visit. I encourage you to leave honest feed back as I do use this information to improve patient care. Thank you for seeing me today!

## 2022-03-07 NOTE — Progress Notes (Signed)
PATIENT: Kylie Galloway DOB: 10-28-1975  REASON FOR VISIT: follow up HISTORY FROM: patient  Virtual Visit via Telephone Note  I connected with Caragh O Sidell on 03/07/22 at  2:00 PM EST by telephone and verified that I am speaking with the correct person using two identifiers.   I discussed the limitations, risks, security and privacy concerns of performing an evaluation and management service by telephone and the availability of in person appointments. I also discussed with the patient that there may be a patient responsible charge related to this service. The patient expressed understanding and agreed to proceed.   History of Present Illness:  03/07/22 ALL (Mychart): Kylie Galloway is a 47 y.o. female here today for follow up for migraines. She continues Ajovy, topiramate '50mg'$  BID and amitriptyline '50mg'$  at bedtime. Nurtec helps with abortive therapy. She feels that migraines are fairly well managed. She has noticed an increase in headache days over the past couple of months. Previously having rare migraines but now having several every month. Nurtec does help. Some headaches linger for days. No obvious triggers.   Meds tried and failed: Ajovy (not as effective), topiramate (on now), amitriptyline (on now), Nurtec, rizatriptan, Excedrin   02/15/21 ALL: [name] returns for follow up for migraines. She continues Ajovy, topiramate '50mg'$  BID and amitriptyline '50mg'$  QHS. Nurtec used for abortive therapy. She reports having 4-5 headaches in a month. The Nurtec is helpful for her most of the time.    02/15/2020 SS:  Kylie Galloway is a 47 year old female with history of migraine headaches.  She remains on amitriptyline and Topamax. Ajovy was added at her last visit due to worsening headaches following Covid.   Headaches are much improved, Ajovy has been quite helpful.  Has a few more migraines during the last week of the month, into the first week of her injection.  She does great weeks 2 and 3, hardly  any headaches.  Stress and weather are triggers.  Nurtec works great for abortive therapy.  Gets 10 tablets a month, never uses her whole supply.  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  No changes to overall health.  Here today for evaluation unaccompanied.   04/06/2019 ALL: KENDELL Galloway is a 47 y.o. female here today for follow up for migraines. She continues amitriptyline '50mg'$  at bedtime and topiramate '50mg'$  BID. She was diagnosed with Covid in December and hospitalized for 7 days with pneumonia. She reports that headaches have worsened since. She is having near daily headaches with 10-12 migrainous days each month. She does have black spots in vision prior to headaches. She has used rizatriptan with some relief but this makes her sleepy.    She is planning to see ortho in April for carpal tunnel. She is wearing wrist braces without much benefit.     HISTORY: (copied from Dr Gladstone Lighter note on 10/06/2018)   UPDATE (10/06/18, VRP): Since last visit, doing well. Symptoms are stable. Severity is mild. No alleviating or aggravating factors. Tolerating meds. Only 1-2 HA per month. Weather and allergies can aggravate HA.    UPDATE (09/30/17, VRP): Since last visit, doing well. Symptoms are mild. Severity is mild. No alleviating or aggravating factors. Tolerating meds. Avg 1 HA per month in general. Increasing numbness in hands and feet.   UPDATE (03/25/17, VRP): Since last visit, was doing well until 3-4 months ago; now increasing HA 10-12 per month. Tolerating meds. No alleviating or aggravating factors. Sleep is suboptimal (5-6 hours  per night or less).     UPDATE 06/23/15: Since last visit, doing well. HA are reduced. Only needed triptan 3-4 times in last 6 months. Tolerating TPX.    UPDATE 12/16/14: Since last visit 5-8 migraines, likely weather related. No other issues. Overall doing well.   UPDATE 08/10/14: Since last visit, doing better. Only 3 days of migraine since last visit.  Tolerating TPX. Rizatriptan also working well. Overall doing well. MRI brain was normal. Still no specific triggers. Incidentally patient developed small rash on right perioral region 1 week ago, which started out as "cold sore". Patient denies any burning, numbness or pain with this rash. Rash seems to be stabilized and no longer expanding.   PRIOR HPI (06/25/14): 47 year old right-handed female here for evaluation of headaches, dizziness, visual changes. Since 2010 patient has had intermittent episodes of headache, frontal, occipital, sometimes unilateral pressure and throbbing sensation with nausea, photophobia, proceeded by seeing spots and vision changes. She was having 1 or less episodes per month. Gradually these increased uptake 3-4 per month. In 2016 she has had 5 episodes. Recent episodes have changed to include severe dizzy, spinning sensation, lasting much longer than previously. Prior episodes used the last 1-2 hours at a time. Now episodes can last 5 hours up to 2-3 days at a time. No prior diagnosis of migraine. Patient's daughter has some headaches but not officially diagnosed. Patient denies any specific triggering or aggravating factors. No food, caffeine, hormonal, sleep or stress triggers. Patient has tried Motrin, BC powder, Tylenol, Aleve, Excedrin migraine in the past for headache treatment, with mild relief. Patient has remote history of Bell's palsy on the right side 2003 with incomplete recovery. Patient has had intermittent bilateral hand numbness in the past.   Observations/Objective:  Generalized: Well developed, in no acute distress  Mentation: Alert oriented to time, place, history taking. Follows all commands speech and language fluent   Assessment and Plan:  47 y.o. year old female  has a past medical history of Allergy, Anxiety, H/O Bell's palsy, Hypertension, and Migraine aura, persistent. here with    ICD-10-CM   1. Migraine without status migrainosus, not  intractable, unspecified migraine type  G43.909       Kylie Galloway reports an increase in headache intensity and frequency. We will switch Ajovy to Emgality injections every 30 days. She will continue topiramate '50mg'$  BID, amitriptyline '50mg'$  QHS and Nurtec '75mg'$  daily as needed. Healthy lifestyle habits encouraged. She will follow up in 6-12 months, sooner if needed.   No orders of the defined types were placed in this encounter.   Meds ordered this encounter  Medications   Galcanezumab-gnlm (EMGALITY) 120 MG/ML SOAJ    Sig: Inject 120 mg into the skin every 30 (thirty) days.    Dispense:  3 mL    Refill:  3    Order Specific Question:   Supervising Provider    Answer:   Melvenia Beam [id number]     Follow Up Instructions:  I discussed the assessment and treatment plan with the patient. The patient was provided an opportunity to ask questions and all were answered. The patient agreed with the plan and demonstrated an understanding of the instructions.   The patient was advised to call back or seek an in-person evaluation if the symptoms worsen or if the condition fails to improve as anticipated.  I provided 25 minutes of non-face-to-face time during this encounter. Patient located at their place of residence during [place] visit. Provider is in the office.  Debbora Presto, NP

## 2022-03-07 NOTE — Telephone Encounter (Signed)
Kylie Galloway/phone room was able to locate pt VM left at 10:29am that she had difficulty logging on for mychart VV.

## 2022-03-14 ENCOUNTER — Encounter: Payer: Self-pay | Admitting: Family Medicine

## 2022-03-15 MED ORDER — WEGOVY 2.4 MG/0.75ML ~~LOC~~ SOAJ: 2.4000 mg | SUBCUTANEOUS | 2 refills | Status: DC

## 2022-03-20 ENCOUNTER — Telehealth: Payer: Self-pay

## 2022-03-20 NOTE — Telephone Encounter (Signed)
PA has been started

## 2022-03-20 NOTE — Telephone Encounter (Signed)
PA initiated per Dr Serita Grit request.   Kylie Galloway (Key: [id number])

## 2022-03-21 ENCOUNTER — Encounter: Payer: Self-pay | Admitting: Family Medicine

## 2022-03-21 NOTE — Telephone Encounter (Signed)
[facility]  received a request from your provider for coverage of Ozempic (semaglutide). The request was denied because:   "Current plan approved criteria allows coverage of the requested drug when the patient has a diagnosis of type 2 diabetes mellitus. Based on the information that was provided, use of this drug does not meet the requirement."

## 2022-04-11 ENCOUNTER — Other Ambulatory Visit: Payer: Self-pay | Admitting: Family Medicine

## 2022-04-11 DIAGNOSIS — I10 Essential (primary) hypertension: Secondary | ICD-10-CM

## 2022-04-16 ENCOUNTER — Other Ambulatory Visit: Payer: Self-pay | Admitting: Family Medicine

## 2022-04-16 DIAGNOSIS — Z1231 Encounter for screening mammogram for malignant neoplasm of breast: Secondary | ICD-10-CM

## 2022-04-21 ENCOUNTER — Encounter: Payer: Self-pay | Admitting: Family Medicine

## 2022-04-23 ENCOUNTER — Encounter: Payer: Self-pay | Admitting: Family Medicine

## 2022-04-23 ENCOUNTER — Ambulatory Visit: Payer: BC Managed Care – PPO | Admitting: Family Medicine

## 2022-04-23 VITALS — BP 100/72 | HR 71 | Temp 97.6°F | Resp 18 | Ht 65.0 in | Wt 169.4 lb

## 2022-04-23 DIAGNOSIS — D72829 Elevated white blood cell count, unspecified: Secondary | ICD-10-CM

## 2022-04-23 DIAGNOSIS — L853 Xerosis cutis: Secondary | ICD-10-CM

## 2022-04-23 DIAGNOSIS — R7303 Prediabetes: Secondary | ICD-10-CM | POA: Diagnosis not present

## 2022-04-23 LAB — BASIC METABOLIC PANEL
BUN: 17 mg/dL (ref 6–23)
CO2: 28 mEq/L (ref 19–32)
Calcium: 9.7 mg/dL (ref 8.4–10.5)
Chloride: 100 mEq/L (ref 96–112)
Creatinine, Ser: 0.84 mg/dL (ref 0.40–1.20)
GFR: 84.56 mL/min (ref >60.00)
Glucose, Bld: 92 mg/dL (ref 70–99)
Potassium: 3.7 mEq/L (ref 3.5–5.1)
Sodium: 139 mEq/L (ref 135–145)

## 2022-04-23 LAB — CBC
HCT: 43 % (ref 36.0–46.0)
Hemoglobin: 15 g/dL (ref 12.0–15.0)
MCHC: 34.8 g/dL (ref 30.0–36.0)
MCV: 78.8 fl (ref 78.0–100.0)
Platelets: 290 10*3/uL (ref 150.0–400.0)
RBC: 5.46 Mil/uL — ABNORMAL HIGH (ref 3.87–5.11)
RDW: 14 % (ref 11.5–15.5)
WBC: 7.4 10*3/uL (ref 4.0–10.5)

## 2022-04-23 LAB — HEMOGLOBIN A1C: Hgb A1c MFr Bld: 5.1 % (ref 4.6–6.5)

## 2022-04-23 LAB — TSH: TSH: 0.98 u[IU]/mL (ref 0.35–5.50)

## 2022-04-23 NOTE — Patient Instructions (Signed)
It was good to see you again today!   

## 2022-04-23 NOTE — Progress Notes (Addendum)
[facility] at [facility] [address], [address] [place], [place] [address] [phone]  Date:  04/23/2022   Name:  PEGGYSUE FEDERER   DOB:  1975/11/25   MRN:  [id number]  PCP:  Pearline Cables, MD    Chief Complaint: increased thirst (Pt believes she is having some s/e of coming off of [facility]. Her last wegovy injection: 03/30/22.)   History of Present Illness:  Tashonda KHADIJHA GARRAWAY is a 47 y.o. very pleasant female patient who presents with the following:  Pt seen today for concern of increased thirst which seem to start when she had to go off Wegovy due to it no longer being covered.  History of hypertension, prediabetes, obesity, migraine headache I saw her most recently in January at which time she had lost from 217- 178 lbs  She contacted me over the weekend with the following message and we made this appointment:  I have a question, ever since I have stopped taking the [facility] shots I have been excessively thirsty and seems like I can't get enough water so much that it is causing my mouth and lips to be super dry.  Would this be a side effect of coming off of the shots or could something else be going on? It's been 2 weeks since I took my last shot.   Pap smear- per GYN, Grewal -records request sent  She came off wegovy 3-4 weeks ago She noted increase in thirst, she has noted her hands seeming dry and chapped and her hair seems dry and thin She is not more hungry than when she was taking the [facility] Some increase in urination but not extreme No abd pain, no vomiting No other sx that she can think of  She does have family history of DM, as well as a personal history of prediabetes and gestational diabetes Wt Readings from Last 3 Encounters:  04/23/22 169 lb 6.4 oz (76.8 kg)  01/15/22 178 lb 6.4 oz (80.9 kg)  12/21/21 178 lb 3.2 oz (80.8 kg)   She is fasting today for labs  We do note an A1c of up to 6.4 last year prior to going on Wegovy    Lab Results  Component Value Date   HGBA1C 5.4 01/15/2022    Lab Results  Component Value Date   TSH 1.89 01/15/2022     Patient Active Problem List   Diagnosis Date Noted   Acute non-recurrent pansinusitis 11/06/2021   Prediabetes 11/11/2019   Pneumonia due to COVID-19 virus 01/02/2019   Respiratory failure with hypoxia 01/02/2019   Ingrown toenail 10/26/2017   Migraine 12/25/2015   Hypertension 01/24/2012    Past Medical History:  Diagnosis Date   Allergy    Anxiety    H/O Bell's palsy    rt sided   Hypertension    Migraine aura, persistent     Past Surgical History:  Procedure Laterality Date   BREAST BIOPSY Right 02/25/2020   CESAREAN SECTION  01/09/2008    Social History   Tobacco Use   Smoking status: Never   Smokeless tobacco: Never  Substance Use Topics   Alcohol use: No   Drug use: No    Family History  Problem Relation Age of Onset   CAD Paternal Grandfather 51   CAD Paternal Grandmother 48   Diabetes Maternal Grandmother    Diabetes Maternal Grandfather    Hypertension Brother     No Known Allergies  Medication  list has been reviewed and updated.  Current Outpatient Medications on File Prior to Visit  Medication Sig Dispense Refill   acetaminophen (TYLENOL) 500 MG tablet Take 1,000 mg by mouth every 6 (six) hours as needed for mild pain or fever.     albuterol (VENTOLIN HFA) 108 (90 Base) MCG/ACT inhaler Inhale 2 puffs into the lungs every 6 (six) hours as needed for wheezing or shortness of breath. 18 g 5   amitriptyline (ELAVIL) 50 MG tablet TAKE 1 TABLET BY MOUTH EVERYDAY AT BEDTIME 90 tablet 4   Apple Cider Vinegar 500 MG TABS Take 500 mg by mouth daily.     bisoprolol (ZEBETA) 5 MG tablet TAKE 1 TABLET (5 MG TOTAL) BY MOUTH DAILY. 90 tablet 0   cholecalciferol (VITAMIN D3) 25 MCG (1000 UT) tablet Take 1,000 Units by mouth daily.     fluticasone (FLONASE) 50 MCG/ACT nasal spray Place 2 sprays into both nostrils daily. 16 g 11    Galcanezumab-gnlm (EMGALITY) 120 MG/ML SOAJ Inject 120 mg into the skin every 30 (thirty) days. 3 mL 3   levonorgestrel (MIRENA, 52 MG,) 20 MCG/DAY IUD Mirena 21 mcg/24 hours (8 yrs) 52 mg intrauterine device  Take 1 device by intrauterine route.     Omega-3 Fatty Acids (FISH OIL) 1000 MG CAPS Take 1,000 mg by mouth daily.      phentermine 37.5 MG capsule Take 1 capsule (37.5 mg total) by mouth every morning. 4 capsule 0   polyethylene glycol (MIRALAX / GLYCOLAX) 17 g packet Take 17 g by mouth daily as needed for moderate constipation. 14 each 0   Rimegepant Sulfate (NURTEC) 75 MG TBDP Take 75 mg by mouth daily as needed (take for abortive therapy of migraine, no more than 1 tablet in 24 hours or 10 per month). 10 tablet 11   silver sulfADIAZINE (SILVADENE) 1 % cream Apply 1 application topically daily. 50 g 0   topiramate (TOPAMAX) 50 MG tablet Take 1 tablet (50 mg total) by mouth 2 (two) times daily. 180 tablet 4   triamcinolone ointment (KENALOG) 0.5 % Apply 1 application. topically 2 (two) times daily. 30 g 0   triamterene-hydrochlorothiazide (MAXZIDE-25) 37.5-25 MG tablet TAKE 1 TABLET BY MOUTH DAILY. USE AS NEEDED FOR LEG SWELLING 90 tablet 1   vitamin B-12 (CYANOCOBALAMIN) 1000 MCG tablet Take 1,000 mcg by mouth daily.     vitamin C (ASCORBIC ACID) 500 MG tablet Take 1,000 mg by mouth daily.      No current facility-administered medications on file prior to visit.    Review of Systems:  As per HPI- otherwise negative.   Physical Examination: Vitals:   04/23/22 0956  BP: 100/72  Pulse: 71  Resp: 18  Temp: 97.6 F (36.4 C)  SpO2: 99%   Vitals:   04/23/22 0956  Weight: 169 lb 6.4 oz (76.8 kg)  Height:  (1.651 m)   Body mass index is 28.19 kg/m. Ideal Body Weight: Weight in (lb) to have BMI = 25: 149.9  GEN: no acute distress. Overweight, looks well  HEENT: Atraumatic, Normocephalic.  Ears and Nose: No external deformity. CV: RRR, No M/G/R. No JVD. No thrill. No  extra heart sounds. PULM: CTA B, no wheezes, crackles, rhonchi. No retractions. No resp. distress. No accessory muscle use. EXTR: No c/c/e PSYCH: Normally interactive. Conversant.    Assessment and Plan: Prediabetes - Plan: Hemoglobin A1c, Basic metabolic panel  Dry skin - Plan: TSH  Leukocytosis, unspecified type - Plan: CBC  Patient  seen today for concern of increased thirst.  She has history of prediabetes, borderline diabetes.  She had been on [facility] for a year for weight loss during which time her A1c was normal However, she has not been forced to stop Wegovy due to insurance change-is possible this is unmasked diabetes mellitus.  Lab work is pending as above, I will be in touch with her ASAP Signed Abbe Amsterdam, MD  Received labs as below, message to patient  Results for orders placed or performed in visit on 04/23/22  Hemoglobin A1c  Result Value Ref Range   Hgb A1c MFr Bld 5.1 4.6 - 6.5 %  TSH  Result Value Ref Range   TSH 0.98 0.35 - 5.50 uIU/mL  Basic metabolic panel  Result Value Ref Range   Sodium 139 135 - 145 mEq/L   Potassium 3.7 3.5 - 5.1 mEq/L   Chloride 100 96 - 112 mEq/L   CO2 28 19 - 32 mEq/L   Glucose, Bld 92 70 - 99 mg/dL   BUN 17 6 - 23 mg/dL   Creatinine, Ser 1.61 0.40 - 1.20 mg/dL   GFR 09.60 >45.40 mL/min   Calcium 9.7 8.4 - 10.5 mg/dL  CBC  Result Value Ref Range   WBC 7.4 4.0 - 10.5 K/uL   RBC 5.46 (H) 3.87 - 5.11 Mil/uL   Platelets 290.0 150.0 - 400.0 K/uL   Hemoglobin 15.0 12.0 - 15.0 g/dL   HCT 98.1 19.1 - 47.8 %   MCV 78.8 78.0 - 100.0 fl   MCHC 34.8 30.0 - 36.0 g/dL   RDW 29.5 62.1 - 30.8 %

## 2022-05-07 ENCOUNTER — Encounter: Payer: Self-pay | Admitting: Family Medicine

## 2022-05-09 ENCOUNTER — Other Ambulatory Visit: Payer: Self-pay | Admitting: Neurology

## 2022-05-09 DIAGNOSIS — G43909 Migraine, unspecified, not intractable, without status migrainosus: Secondary | ICD-10-CM

## 2022-05-15 ENCOUNTER — Other Ambulatory Visit: Payer: Self-pay | Admitting: Family Medicine

## 2022-05-16 ENCOUNTER — Encounter: Payer: Self-pay | Admitting: Family Medicine

## 2022-05-23 ENCOUNTER — Encounter: Payer: Self-pay | Admitting: Family Medicine

## 2022-05-23 ENCOUNTER — Ambulatory Visit
Admission: RE | Admit: 2022-05-23 | Discharge: 2022-05-23 | Disposition: A | Discharge status: Home / Self Care | Payer: BC Managed Care – PPO | Source: Ambulatory Visit | Attending: Family Medicine | Admitting: Family Medicine

## 2022-05-23 DIAGNOSIS — Z1231 Encounter for screening mammogram for malignant neoplasm of breast: Secondary | ICD-10-CM

## 2022-05-24 ENCOUNTER — Encounter: Payer: Self-pay | Admitting: Family Medicine

## 2022-05-24 ENCOUNTER — Telehealth (INDEPENDENT_AMBULATORY_CARE_PROVIDER_SITE_OTHER): Payer: BC Managed Care – PPO | Admitting: Family Medicine

## 2022-05-24 VITALS — Ht 65.0 in | Wt 161.0 lb

## 2022-05-24 DIAGNOSIS — F4322 Adjustment disorder with anxiety: Secondary | ICD-10-CM | POA: Diagnosis not present

## 2022-05-24 MED ORDER — ALPRAZOLAM 0.25 MG PO TABS
0.2500 mg | ORAL_TABLET | Freq: Two times a day (BID) | ORAL | 0 refills | Status: DC | PRN
Start: 2022-05-24 — End: 2023-05-01

## 2022-05-24 NOTE — Progress Notes (Signed)
[facility] at [facility] [address] [place], [place] [address] [phone]  Date:  05/24/2022   Name:  Kylie Galloway   DOB:  [birth year]   MRN:  [id number]  PCP:  Pearline Cables, MD    Chief Complaint: Stress   History of Present Illness:  Kylie Galloway is a 47 y.o. very pleasant female patient who presents with the following:  Virtual visit today - pt location is home, I am at home Pt identity confirmed with 2 factors, she gives consent for virtual visit today The pat and myself are present on the visit today She is at home resting today as she had a cyst removed from the top of her head  She has been struggling with some marital issues since March She is seeing a therapist herself and is also doing couples counseling She has noted some panic attacks She did use xanax in the past  She thinks she is mostly anxious as opposed to depression Esp at work she may get overwhelmed She is eating less- appetite, sleep is pretty good Energy level can vary  No SI   Lab Results  Component Value Date   HGBA1C 5.1 04/23/2022     Patient Active Problem List   Diagnosis Date Noted   Acute non-recurrent pansinusitis 11/06/2021   Prediabetes 11/11/2019   Pneumonia due to COVID-19 virus 01/02/2019   Respiratory failure with hypoxia (HCC) 01/02/2019   Ingrown toenail 10/26/2017   Migraine 12/25/2015   Hypertension 01/24/2012    Past Medical History:  Diagnosis Date   Allergy    Anxiety    H/O Bell's palsy    rt sided   Hypertension    Migraine aura, persistent     Past Surgical History:  Procedure Laterality Date   BREAST BIOPSY Right 02/25/2020   CESAREAN SECTION  01/09/2008    Social History   Tobacco Use   Smoking status: Never   Smokeless tobacco: Never  Substance Use Topics   Alcohol use: No   Drug use: No    Family History  Problem Relation Age of Onset   CAD Paternal Grandfather 54   CAD Paternal  Grandmother 22   Diabetes Maternal Grandmother    Diabetes Maternal Grandfather    Hypertension Brother     No Known Allergies  Medication list has been reviewed and updated.  Current Outpatient Medications on File Prior to Visit  Medication Sig Dispense Refill   acetaminophen (TYLENOL) 500 MG tablet Take 1,000 mg by mouth every 6 (six) hours as needed for mild pain or fever.     albuterol (VENTOLIN HFA) 108 (90 Base) MCG/ACT inhaler Inhale 2 puffs into the lungs every 6 (six) hours as needed for wheezing or shortness of breath. 18 g 5   amitriptyline (ELAVIL) 50 MG tablet TAKE 1 TABLET BY MOUTH EVERYDAY AT BEDTIME 90 tablet 4   Apple Cider Vinegar 500 MG TABS Take 500 mg by mouth daily.     bisoprolol (ZEBETA) 5 MG tablet TAKE 1 TABLET (5 MG TOTAL) BY MOUTH DAILY. 90 tablet 0   cholecalciferol (VITAMIN D3) 25 MCG (1000 UT) tablet Take 1,000 Units by mouth daily.     fluticasone (FLONASE) 50 MCG/ACT nasal spray Place 2 sprays into both nostrils daily. 16 g 11   Galcanezumab-gnlm (EMGALITY) 120 MG/ML SOAJ Inject 120 mg into the skin every 30 (thirty) days. 3 mL 3   levonorgestrel (MIRENA, 52 MG,)  20 MCG/DAY IUD Mirena 21 mcg/24 hours (8 yrs) 52 mg intrauterine device  Take 1 device by intrauterine route.     Omega-3 Fatty Acids (FISH OIL) 1000 MG CAPS Take 1,000 mg by mouth daily.      phentermine 37.5 MG capsule Take 1 capsule (37.5 mg total) by mouth every morning. 4 capsule 0   polyethylene glycol (MIRALAX / GLYCOLAX) 17 g packet Take 17 g by mouth daily as needed for moderate constipation. 14 each 0   Rimegepant Sulfate (NURTEC) 75 MG TBDP Take 75 mg by mouth daily as needed (take for abortive therapy of migraine, no more than 1 tablet in 24 hours or 10 per month). 10 tablet 11   silver sulfADIAZINE (SILVADENE) 1 % cream Apply 1 application topically daily. 50 g 0   topiramate (TOPAMAX) 50 MG tablet Take 1 tablet (50 mg total) by mouth 2 (two) times daily. 180 tablet 1   triamcinolone  ointment (KENALOG) 0.5 % Apply 1 application. topically 2 (two) times daily. 30 g 0   triamterene-hydrochlorothiazide (MAXZIDE-25) 37.5-25 MG tablet TAKE 1 TABLET BY MOUTH DAILY. USE AS NEEDED FOR LEG SWELLING 90 tablet 1   vitamin B-12 (CYANOCOBALAMIN) 1000 MCG tablet Take 1,000 mcg by mouth daily.     vitamin C (ASCORBIC ACID) 500 MG tablet Take 1,000 mg by mouth daily.      No current facility-administered medications on file prior to visit.    Review of Systems:  As per HPI- otherwise negative.   Physical Examination: There were no vitals filed for this visit. Vitals:   05/24/22 1543  Weight: 161 lb (73 kg)  Height: 5\' 5"  (1.651 m)   Body mass index is 26.79 kg/m. Ideal Body Weight: Weight in (lb) to have BMI = 25: 149.9  Observed via mychart video- she looks well, no SOB or distress is noted   Assessment and Plan: Adjustment disorder with anxious mood - Plan: ALPRAZolam (XANAX) 0.25 MG tablet  Virtual visit today to discuss anxiety.  Patient is having some issues in her marriage which have increased her stress and anxiety level.  She denies any suicidal ideation, she is seeing a Veterinary surgeon.  She prefers not to use a daily medication (SSRI, SNRI), especially as she is also taking amitriptyline and Topamax for her headaches, she knows there can be some drug interactions We decided to use as needed alprazolam for panic attack or severe anxiety, I have cautioned patient regarding habit formation and sedation.  Advised that if she needs this medicine more than a few times a week we will likely need to consider adding a daily medication or some other tactic.  She states understanding, she will keep me closely apprised as to her progress  Signed Abbe Amsterdam, MD

## 2022-06-07 ENCOUNTER — Encounter: Payer: Self-pay | Admitting: Family Medicine

## 2022-06-14 ENCOUNTER — Ambulatory Visit: Payer: BC Managed Care – PPO | Admitting: Podiatry

## 2022-06-14 DIAGNOSIS — L600 Ingrowing nail: Secondary | ICD-10-CM

## 2022-06-14 DIAGNOSIS — L603 Nail dystrophy: Secondary | ICD-10-CM

## 2022-06-14 NOTE — Patient Instructions (Signed)

## 2022-06-14 NOTE — Progress Notes (Signed)
  Subjective:  Patient ID: Kylie Galloway, female    DOB: 02-Nov-1975,  MRN: [id number]  Chief Complaint  Patient presents with   Nail Problem    Rm 15  Left great hallux nail trauma. Pt states she dropped something on her toe months ago and lost her nail. Now the new nail is growing but no attached to the nail bed.     47 y.o. female presents with damage to the left hallux nail.  She has dropped something on it about a year ago.  Part of the nail came off at that time and the remaining portion of the base of the nail is growing abnormally.  She says it does not cause pain but she does not like how it looks cosmetically and is concerned that it will be an issue for her moving forward.  Past Medical History:  Diagnosis Date   Allergy    Anxiety    H/O Bell's palsy    rt sided   Hypertension    Migraine aura, persistent     No Known Allergies  ROS: Negative except as per HPI above  Objective:  General: AAO x3, NAD  Dermatological: Hallux nail with significant dystrophy there is only half the nail attached and is not well attached and onycholysis is present.  There is discoloration and dystrophy present no pain on palpation however.  Vascular:  Dorsalis Pedis artery and Posterior Tibial artery pedal pulses are 2/4 bilateral.  Capillary fill time < 3 sec to all digits.   Neruologic: Grossly intact via light touch bilateral. Protective threshold intact to all sites bilateral.   Musculoskeletal: No gross boney pedal deformities bilateral. No pain, crepitus, or limitation noted with foot and ankle range of motion bilateral. Muscular strength 5/5 in all groups tested bilateral.  Gait: Unassisted, Nonantalgic.   No images are attached to the encounter.   Assessment:   1. Ingrown nail of great toe of left foot   2. Onychodystrophy      Plan:  Patient was evaluated and treated and all questions answered.  Ingrown Nail, left secondary to traumatic dystrophy -Patient elects to  proceed with minor surgery to remove ingrown toenail today. Consent reviewed and signed by patient.  Patient wishes to have only avulsion procedure and not matrixectomy as she wants the nail to regrow however I discussed that it may grow back abnormally -Total nail excised. See procedure note. -Educated on post-procedure care including soaking. Written instructions provided and reviewed. -Patient to follow up in 2 weeks for nail check.  Procedure: Excision of Ingrown Toenail Location: Left 1st toe  total  nail Anesthesia: Lidocaine 1% plain; 1.5 mL and Marcaine 0.5% plain; 1.5 mL, digital block. Skin Prep: Betadine. Dressing: Silvadene; telfa; dry, sterile, compression dressing. Technique: Following skin prep, the toe was exsanguinated and a tourniquet was secured at the base of the toe. The affected nail border was freed, split with a nail splitter, and excised. Nail bed irrigated out with alcohol. The tourniquet was then removed and sterile dressing applied. Disposition: Patient tolerated procedure well. Patient to return in 2 weeks for follow-up.    Return in about 2 weeks (around 06/28/2022) for Nail check.          Corinna Gab, DPM [facility] / [facility]

## 2022-06-29 ENCOUNTER — Ambulatory Visit: Payer: BC Managed Care – PPO | Admitting: Podiatry

## 2022-06-29 DIAGNOSIS — L600 Ingrowing nail: Secondary | ICD-10-CM

## 2022-06-29 DIAGNOSIS — L603 Nail dystrophy: Secondary | ICD-10-CM | POA: Diagnosis not present

## 2022-06-29 NOTE — Progress Notes (Signed)
Subjective: Kylie Galloway is a 47 y.o.  female returns to office today for follow up evaluation after having left Hallux total nail ingrown removal with phenol and alcohol matrixectomy approximately 2 weeks ago. Patient has been soaking using epsom salts and applying topical antibiotic covered with bandaid daily. Patient denies fevers, chills, nausea, vomiting. Denies any calf pain, chest pain, SOB.   Objective:  Vitals: Reviewed  General: Well developed, nourished, in no acute distress, alert and oriented x3   Dermatology: Skin is warm, dry and supple bilateral. Left hallux nail bed appears to be clean, dry, with mild granular tissue and surrounding scab. There is no surrounding erythema, edema, drainage/purulence. The remaining nails appear unremarkable at this time. There are no other lesions or other signs of infection present.  Neurovascular status: Intact. No lower extremity swelling; No pain with calf compression bilateral.  Musculoskeletal: Decreased tenderness to palpation of the left hallux nail bed. Muscular strength within normal limits bilateral.   Assesement and Plan: S/p phenol and alcohol matrixectomy to the  left hallux nail total, doing well.   -Continue soaking in epsom salts twice a day followed by antibiotic ointment and a band-aid. Can leave uncovered at night. Continue this until completely healed.  -If the area has not healed in 2 weeks, call the office for follow-up appointment, or sooner if any problems arise.  -Monitor for any signs/symptoms of infection. Call the office immediately if any occur or go directly to the emergency room. Call with any questions/concerns.        Corinna Gab, DPM [facility] / [facility]                   06/29/2022

## 2022-07-02 ENCOUNTER — Encounter (INDEPENDENT_AMBULATORY_CARE_PROVIDER_SITE_OTHER): Payer: BC Managed Care – PPO | Admitting: Family Medicine

## 2022-07-02 DIAGNOSIS — B001 Herpesviral vesicular dermatitis: Secondary | ICD-10-CM | POA: Diagnosis not present

## 2022-07-02 MED ORDER — VALACYCLOVIR HCL 1 G PO TABS
ORAL_TABLET | ORAL | 0 refills | Status: DC
Start: 2022-07-02 — End: 2022-10-01

## 2022-07-02 NOTE — Telephone Encounter (Signed)
Please see the MyChart message reply(ies) for my assessment and plan.  The patient gave consent for this Medical Advice Message and is aware that it may result in a bill to their insurance company as well as the possibility that this may result in a co-payment or deductible. They are an established patient, but are not seeking medical advice exclusively about a problem treated during an in person or video visit in the last 7 days. I did not recommend an in person or video visit within 7 days of my reply.  I spent a total of 10 minutes cumulative time within 7 days through MyChart messaging Teodoro Jeffreys, MD  

## 2022-07-06 ENCOUNTER — Other Ambulatory Visit: Payer: Self-pay | Admitting: Family Medicine

## 2022-07-06 DIAGNOSIS — I10 Essential (primary) hypertension: Secondary | ICD-10-CM

## 2022-07-11 ENCOUNTER — Encounter: Payer: Self-pay | Admitting: Family Medicine

## 2022-07-11 ENCOUNTER — Other Ambulatory Visit: Payer: Self-pay

## 2022-07-11 DIAGNOSIS — G43909 Migraine, unspecified, not intractable, without status migrainosus: Secondary | ICD-10-CM

## 2022-07-11 DIAGNOSIS — G43109 Migraine with aura, not intractable, without status migrainosus: Secondary | ICD-10-CM

## 2022-07-11 MED ORDER — AJOVY 225 MG/1.5ML ~~LOC~~ SOAJ
225.0000 mg | SUBCUTANEOUS | 3 refills | Status: DC
Start: 2022-07-11 — End: 2023-02-14

## 2022-08-09 ENCOUNTER — Encounter: Payer: Self-pay | Admitting: Family Medicine

## 2022-08-09 ENCOUNTER — Other Ambulatory Visit: Payer: Self-pay | Admitting: Family Medicine

## 2022-09-01 ENCOUNTER — Other Ambulatory Visit: Payer: Self-pay | Admitting: Family Medicine

## 2022-09-12 ENCOUNTER — Encounter: Payer: Self-pay | Admitting: Family Medicine

## 2022-09-18 ENCOUNTER — Encounter (INDEPENDENT_AMBULATORY_CARE_PROVIDER_SITE_OTHER): Payer: BC Managed Care – PPO | Admitting: Family Medicine

## 2022-09-18 DIAGNOSIS — U071 COVID-19: Secondary | ICD-10-CM

## 2022-09-18 DIAGNOSIS — J019 Acute sinusitis, unspecified: Secondary | ICD-10-CM

## 2022-09-19 DIAGNOSIS — J019 Acute sinusitis, unspecified: Secondary | ICD-10-CM | POA: Diagnosis not present

## 2022-09-19 MED ORDER — AMOXICILLIN 500 MG PO CAPS
1000.0000 mg | ORAL_CAPSULE | Freq: Two times a day (BID) | ORAL | 0 refills | Status: DC
Start: 2022-09-19 — End: 2022-10-01

## 2022-09-19 NOTE — Telephone Encounter (Signed)

## 2022-09-21 MED ORDER — NIRMATRELVIR/RITONAVIR (PAXLOVID)TABLET
3.0000 | ORAL_TABLET | Freq: Two times a day (BID) | ORAL | 0 refills | Status: AC
Start: 2022-09-21 — End: 2022-09-26

## 2022-09-21 NOTE — Addendum Note (Signed)
Addended by: Abbe Amsterdam C on: 09/21/2022 03:45 PM   Modules accepted: Orders

## 2022-09-26 ENCOUNTER — Encounter: Payer: BC Managed Care – PPO | Admitting: Family Medicine

## 2022-09-30 NOTE — Patient Instructions (Incomplete)
Good to see you again today, I will be in touch with your lab work

## 2022-09-30 NOTE — Progress Notes (Unsigned)
[facility] [address], [address] [place], [place] [address] [phone]  Date:  10/01/2022   Name:  Kylie Galloway   DOB:  1975-09-10   MRN:  [id number]  PCP:  Pearline Cables, MD    Chief Complaint: No chief complaint on file.   History of Present Illness:  Kylie Galloway is a 47 y.o. very pleasant female patient who presents with the following:  Patient seen today for physical Most recent visit with myself was a virtual visit in May History of prediabetes, migraine headaches, hypertension  Flu vaccine COVID booster Mammogram up-to-date Pap smear Remind patient that colon cancer screening is due after her next birthday  Patient Active Problem List   Diagnosis Date Noted   Acute non-recurrent pansinusitis 11/06/2021   Prediabetes 11/11/2019   Pneumonia due to COVID-19 virus 01/02/2019   Respiratory failure with hypoxia (HCC) 01/02/2019   Ingrown toenail 10/26/2017   Migraine 12/25/2015   Hypertension 01/24/2012    Past Medical History:  Diagnosis Date   Allergy    Anxiety    H/O Bell's palsy    rt sided   Hypertension    Migraine aura, persistent     Past Surgical History:  Procedure Laterality Date   BREAST BIOPSY Right 02/25/2020   CESAREAN SECTION  01/09/2008    Social History   Tobacco Use   Smoking status: Never   Smokeless tobacco: Never  Substance Use Topics   Alcohol use: No   Drug use: No    Family History  Problem Relation Age of Onset   CAD Paternal Grandfather 22   CAD Paternal Grandmother 1   Diabetes Maternal Grandmother    Diabetes Maternal Grandfather    Hypertension Brother     No Known Allergies  Medication list has been reviewed and updated.  Current Outpatient Medications on File Prior to Visit  Medication Sig Dispense Refill   acetaminophen (TYLENOL) 500 MG tablet Take 1,000 mg by mouth every 6 (six) hours as needed for mild pain or fever.     albuterol  (VENTOLIN HFA) 108 (90 Base) MCG/ACT inhaler Inhale 2 puffs into the lungs every 6 (six) hours as needed for wheezing or shortness of breath. 18 g 5   ALPRAZolam (XANAX) 0.25 MG tablet Take 1-2 tablets (0.25-0.5 mg total) by mouth 2 (two) times daily as needed for anxiety. 30 tablet 0   amitriptyline (ELAVIL) 50 MG tablet TAKE 1 TABLET BY MOUTH EVERYDAY AT BEDTIME 90 tablet 4   amoxicillin (AMOXIL) 500 MG capsule Take 2 capsules (1,000 mg total) by mouth 2 (two) times daily. 40 capsule 0   Apple Cider Vinegar 500 MG TABS Take 500 mg by mouth daily.     bisoprolol (ZEBETA) 5 MG tablet TAKE 1 TABLET (5 MG TOTAL) BY MOUTH DAILY. 90 tablet 1   cholecalciferol (VITAMIN D3) 25 MCG (1000 UT) tablet Take 1,000 Units by mouth daily.     fluticasone (FLONASE) 50 MCG/ACT nasal spray Place 2 sprays into both nostrils daily. 16 g 11   Fremanezumab-vfrm (AJOVY) 225 MG/1.5ML SOAJ Inject 225 mg into the skin every 30 (thirty) days. 4.5 mL 3   levonorgestrel (MIRENA, 52 MG,) 20 MCG/DAY IUD Mirena 21 mcg/24 hours (8 yrs) 52 mg intrauterine device  Take 1 device by intrauterine route.     Omega-3 Fatty Acids (FISH OIL) 1000 MG CAPS Take 1,000 mg by mouth daily.      phentermine  37.5 MG capsule Take 1 capsule (37.5 mg total) by mouth every morning. 4 capsule 0   polyethylene glycol (MIRALAX / GLYCOLAX) 17 g packet Take 17 g by mouth daily as needed for moderate constipation. 14 each 0   Rimegepant Sulfate (NURTEC) 75 MG TBDP Take 75 mg by mouth daily as needed (take for abortive therapy of migraine, no more than 1 tablet in 24 hours or 10 per month). 10 tablet 11   silver sulfADIAZINE (SILVADENE) 1 % cream Apply 1 application topically daily. 50 g 0   topiramate (TOPAMAX) 50 MG tablet Take 1 tablet (50 mg total) by mouth 2 (two) times daily. 180 tablet 1   triamcinolone ointment (KENALOG) 0.5 % Apply 1 application. topically 2 (two) times daily. 30 g 0   triamterene-hydrochlorothiazide (MAXZIDE-25) 37.5-25 MG tablet  TAKE 1 TABLET BY MOUTH DAILY. USE AS NEEDED FOR LEG SWELLING 90 tablet 1   valACYclovir (VALTREX) 1000 MG tablet Take 2000 mg by mouth once, then repeat in 12 hours for cold sore 20 tablet 0   vitamin B-12 (CYANOCOBALAMIN) 1000 MCG tablet Take 1,000 mcg by mouth daily.     vitamin C (ASCORBIC ACID) 500 MG tablet Take 1,000 mg by mouth daily.      No current facility-administered medications on file prior to visit.    Review of Systems:  As per HPI- otherwise negative.   Physical Examination: There were no vitals filed for this visit. There were no vitals filed for this visit. There is no height or weight on file to calculate BMI. Ideal Body Weight:    GEN: no acute distress. HEENT: Atraumatic, Normocephalic.  Ears and Nose: No external deformity. CV: RRR, No M/G/R. No JVD. No thrill. No extra heart sounds. PULM: CTA B, no wheezes, crackles, rhonchi. No retractions. No resp. distress. No accessory muscle use. ABD: S, NT, ND, +BS. No rebound. No HSM. EXTR: No c/c/e PSYCH: Normally interactive. Conversant.    Assessment and Plan: *** Physical exam today.  Encouraged healthy diet and exercise routine Will plan further follow- up pending labs.  Signed Abbe Amsterdam, MD

## 2022-10-01 ENCOUNTER — Ambulatory Visit (INDEPENDENT_AMBULATORY_CARE_PROVIDER_SITE_OTHER): Payer: BC Managed Care – PPO | Admitting: Family Medicine

## 2022-10-01 VITALS — BP 132/80 | HR 89 | Temp 97.8°F | Resp 18 | Ht 65.0 in | Wt 183.6 lb

## 2022-10-01 DIAGNOSIS — Z0000 Encounter for general adult medical examination without abnormal findings: Secondary | ICD-10-CM

## 2022-10-01 DIAGNOSIS — Z131 Encounter for screening for diabetes mellitus: Secondary | ICD-10-CM

## 2022-10-01 DIAGNOSIS — Z1329 Encounter for screening for other suspected endocrine disorder: Secondary | ICD-10-CM | POA: Diagnosis not present

## 2022-10-01 DIAGNOSIS — R5383 Other fatigue: Secondary | ICD-10-CM | POA: Diagnosis not present

## 2022-10-01 DIAGNOSIS — Z130 Encounter for screening for diseases of the blood and blood-forming organs and certain disorders involving the immune mechanism: Secondary | ICD-10-CM

## 2022-10-01 DIAGNOSIS — Z13220 Encounter for screening for lipoid disorders: Secondary | ICD-10-CM

## 2022-10-02 ENCOUNTER — Encounter: Payer: Self-pay | Admitting: Family Medicine

## 2022-10-02 LAB — CBC
HCT: 41.4 % (ref 36.0–46.0)
Hemoglobin: 13.6 g/dL (ref 12.0–15.0)
MCHC: 32.7 g/dL (ref 30.0–36.0)
MCV: 82.5 fl (ref 78.0–100.0)
Platelets: 294 10*3/uL (ref 150.0–400.0)
RBC: 5.02 Mil/uL (ref 3.87–5.11)
RDW: 13.9 % (ref 11.5–15.5)
WBC: 9.2 10*3/uL (ref 4.0–10.5)

## 2022-10-02 LAB — COMPREHENSIVE METABOLIC PANEL
ALT: 25 U/L (ref 0–35)
AST: 16 U/L (ref 0–37)
Albumin: 4.2 g/dL (ref 3.5–5.2)
Alkaline Phosphatase: 46 U/L (ref 39–117)
BUN: 18 mg/dL (ref 6–23)
CO2: 28 mEq/L (ref 19–32)
Calcium: 9.2 mg/dL (ref 8.4–10.5)
Chloride: 105 mEq/L (ref 96–112)
Creatinine, Ser: 0.82 mg/dL (ref 0.40–1.20)
GFR: 86.77 mL/min (ref >60.00)
Glucose, Bld: 122 mg/dL — ABNORMAL HIGH (ref 70–99)
Potassium: 3.8 mEq/L (ref 3.5–5.1)
Sodium: 142 mEq/L (ref 135–145)
Total Bilirubin: 0.4 mg/dL (ref 0.2–1.2)
Total Protein: 6.7 g/dL (ref 6.0–8.3)

## 2022-10-02 LAB — LIPID PANEL
Cholesterol: 196 mg/dL (ref 0–200)
HDL: 49.3 mg/dL (ref >39.00)
LDL Cholesterol: 123 mg/dL — ABNORMAL HIGH (ref 0–99)
NonHDL: 147.11
Total CHOL/HDL Ratio: 4
Triglycerides: 120 mg/dL (ref 0.0–149.0)
VLDL: 24 mg/dL (ref 0.0–40.0)

## 2022-10-02 LAB — TSH: TSH: 2.13 u[IU]/mL (ref 0.35–5.50)

## 2022-10-02 LAB — HEMOGLOBIN A1C: Hgb A1c MFr Bld: 5.7 % (ref 4.6–6.5)

## 2022-10-02 LAB — VITAMIN D 25 HYDROXY (VIT D DEFICIENCY, FRACTURES): VITD: 51.64 ng/mL (ref 30.00–100.00)

## 2022-10-12 ENCOUNTER — Encounter (INDEPENDENT_AMBULATORY_CARE_PROVIDER_SITE_OTHER): Payer: BC Managed Care – PPO | Admitting: Family Medicine

## 2022-10-12 DIAGNOSIS — F321 Major depressive disorder, single episode, moderate: Secondary | ICD-10-CM | POA: Diagnosis not present

## 2022-10-15 MED ORDER — FLUOXETINE HCL 20 MG PO CAPS
20.0000 mg | ORAL_CAPSULE | Freq: Every day | ORAL | 3 refills | Status: DC
Start: 2022-10-15 — End: 2022-10-17

## 2022-10-15 NOTE — Addendum Note (Signed)
Addended by: Abbe Amsterdam C on: 10/15/2022 09:27 AM   Modules accepted: Orders

## 2022-10-15 NOTE — Telephone Encounter (Signed)
Please see the MyChart message reply(ies) for my assessment and plan.  The patient gave consent for this Medical Advice Message and is aware that it may result in a bill to their insurance company as well as the possibility that this may result in a co-payment or deductible. They are an established patient, but are not seeking medical advice exclusively about a problem treated during an in person or video visit in the last 7 days. I did not recommend an in person or video visit within 7 days of my reply.  I spent a total of 18 minutes cumulative time within 7 days through [employer] Abbe Amsterdam, MD

## 2022-10-17 MED ORDER — VENLAFAXINE HCL ER 37.5 MG PO CP24
37.5000 mg | ORAL_CAPSULE | Freq: Every day | ORAL | 3 refills | Status: DC
Start: 2022-10-17 — End: 2022-11-09

## 2022-10-17 NOTE — Addendum Note (Signed)
Addended by: Abbe Amsterdam C on: 10/17/2022 04:52 PM   Modules accepted: Orders

## 2022-11-09 ENCOUNTER — Other Ambulatory Visit: Payer: Self-pay | Admitting: Family Medicine

## 2022-11-09 DIAGNOSIS — F321 Major depressive disorder, single episode, moderate: Secondary | ICD-10-CM

## 2022-11-10 ENCOUNTER — Other Ambulatory Visit: Payer: Self-pay | Admitting: Family Medicine

## 2022-11-14 ENCOUNTER — Encounter: Payer: Self-pay | Admitting: Family Medicine

## 2022-11-14 DIAGNOSIS — F321 Major depressive disorder, single episode, moderate: Secondary | ICD-10-CM

## 2022-11-15 MED ORDER — VENLAFAXINE HCL ER 75 MG PO CP24: 75.0000 mg | ORAL_CAPSULE | Freq: Every day | ORAL | 0 refills | Status: DC

## 2022-12-25 ENCOUNTER — Encounter: Payer: Self-pay | Admitting: Family Medicine

## 2022-12-25 MED ORDER — POLYETHYLENE GLYCOL 3350 17 G PO PACK: 17.0000 g | PACK | Freq: Every day | ORAL | 3 refills | Status: AC

## 2022-12-25 MED ORDER — POLYETHYLENE GLYCOL 3350 17 G PO PACK: 17.0000 g | PACK | Freq: Every day | ORAL | 0 refills | Status: DC

## 2022-12-25 NOTE — Addendum Note (Signed)
Addended by: CREFT, Melton Alar L on: 12/25/2022 12:05 PM   Modules accepted: Orders

## 2022-12-28 ENCOUNTER — Encounter: Payer: Self-pay | Admitting: Family Medicine

## 2023-01-26 ENCOUNTER — Other Ambulatory Visit: Payer: Self-pay | Admitting: Family Medicine

## 2023-01-26 DIAGNOSIS — I10 Essential (primary) hypertension: Secondary | ICD-10-CM

## 2023-02-02 ENCOUNTER — Other Ambulatory Visit: Payer: Self-pay | Admitting: Family Medicine

## 2023-02-09 ENCOUNTER — Other Ambulatory Visit: Payer: Self-pay | Admitting: Family Medicine

## 2023-02-09 DIAGNOSIS — F321 Major depressive disorder, single episode, moderate: Secondary | ICD-10-CM

## 2023-02-11 ENCOUNTER — Encounter: Payer: Self-pay | Admitting: Family Medicine

## 2023-02-11 NOTE — Progress Notes (Signed)
 PATIENT: Kylie Galloway DOB: 03/14/1975  REASON FOR VISIT: follow up HISTORY FROM: patient  Virtual Visit via Telephone Note  I connected with Kylie Galloway on 02/14/23 at  8:00 AM EST by telephone and verified that I am speaking with the correct person using two identifiers.   I discussed the limitations, risks, security and privacy concerns of performing an evaluation and management service by telephone and the availability of in person appointments. I also discussed with the patient that there may be a patient responsible charge related to this service. The patient expressed understanding and agreed to proceed.   History of Present Illness:  02/14/23 ALL (Mychart): Kylie Galloway returns for follow up for migraines. She was last seen 02/2022 and reported more headaches on Ajovy , topiramate  50mg  BID, amitriptyline  50mg  at bedtime and Nurtec PRN. We switched Ajovy  to Emgality . She took one injection but did not feel it was effective and we switched her back to Ajovy . Since, she reports doing well until about 2-3 months ago. She has had new stressors and PCP adjusted mood management meds. Amitriptyline  discontinued and venlafaxine  started. Headaches gradually started worsening. Over the past 2-3 weeks, she is having daily headaches. Most are migrainous. Nurtec works well for abortive therapy but makes her sleepy.   03/07/2022 ALL (Mychart): Kylie Galloway is a 48 y.o. female here today for follow up for migraines. She continues Ajovy , topiramate  50mg  BID and amitriptyline  50mg  at bedtime. Nurtec helps with abortive therapy. She feels that migraines are fairly well managed. She has noticed an increase in headache days over the past couple of months. Previously having rare migraines but now having several every month. Nurtec does help. Some headaches linger for days. No obvious triggers.   Meds tried and failed: Ajovy  (not as effective), topiramate  (on now), amitriptyline  (on now), Nurtec, rizatriptan ,  Excedrin   02/15/21 ALL: Kylie Galloway returns for follow up for migraines. She continues Ajovy , topiramate  50mg  BID and amitriptyline  50mg  QHS. Nurtec used for abortive therapy. She reports having 4-5 headaches in a month. The Nurtec is helpful for her most of the time.    02/15/2020 SS:  Kylie Galloway is a 48 year old female with history of migraine headaches.  She remains on amitriptyline  and Topamax . Ajovy  was added at her last visit due to worsening headaches following Covid.   Headaches are much improved, Ajovy  has been quite helpful.  Has a few more migraines during the last week of the month, into the first week of her injection.  She does great weeks 2 and 3, hardly any headaches.  Stress and weather are triggers.  Nurtec works great for abortive therapy.  Gets 10 tablets a month, never uses her whole supply.  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  No changes to overall health.  Here today for evaluation unaccompanied.   04/06/2019 ALL: Kylie Galloway is a 48 y.o. female here today for follow up for migraines. She continues amitriptyline  50mg  at bedtime and topiramate  50mg  BID. She was diagnosed with Covid in December and hospitalized for 7 days with pneumonia. She reports that headaches have worsened since. She is having near daily headaches with 10-12 migrainous days each month. She does have black spots in vision prior to headaches. She has used rizatriptan  with some relief but this makes her sleepy.    She is planning to see ortho in April for carpal tunnel. She is wearing wrist braces without much benefit.     HISTORY: (copied from  Dr Chancy note on 10/06/2018)   UPDATE (10/06/18, VRP): Since last visit, doing well. Symptoms are stable. Severity is mild. No alleviating or aggravating factors. Tolerating meds. Only 1-2 HA per month. Weather and allergies can aggravate HA.    UPDATE (09/30/17, VRP): Since last visit, doing well. Symptoms are mild. Severity is mild. No  alleviating or aggravating factors. Tolerating meds. Avg 1 HA per month in general. Increasing numbness in hands and feet.   UPDATE (03/25/17, VRP): Since last visit, was doing well until 3-4 months ago; now increasing HA 10-12 per month. Tolerating meds. No alleviating or aggravating factors. Sleep is suboptimal (5-6 hours per night or less).     UPDATE 06/23/15: Since last visit, doing well. HA are reduced. Only needed triptan 3-4 times in last 6 months. Tolerating TPX.    UPDATE 12/16/14: Since last visit 5-8 migraines, likely weather related. No other issues. Overall doing well.   UPDATE 08/10/14: Since last visit, doing better. Only 3 days of migraine since last visit. Tolerating TPX. Rizatriptan  also working well. Overall doing well. MRI brain was normal. Still no specific triggers. Incidentally patient developed small rash on right perioral region 1 week ago, which started out as cold sore. Patient denies any burning, numbness or pain with this rash. Rash seems to be stabilized and no longer expanding.   PRIOR HPI (06/25/14): 48 year old right-handed female here for evaluation of headaches, dizziness, visual changes. Since 2010 patient has had intermittent episodes of headache, frontal, occipital, sometimes unilateral pressure and throbbing sensation with nausea, photophobia, proceeded by seeing spots and vision changes. She was having 1 or less episodes per month. Gradually these increased uptake 3-4 per month. In 2016 she has had 5 episodes. Recent episodes have changed to include severe dizzy, spinning sensation, lasting much longer than previously. Prior episodes used the last 1-2 hours at a time. Now episodes can last 5 hours up to 2-3 days at a time. No prior diagnosis of migraine. Patient's daughter has some headaches but not officially diagnosed. Patient denies any specific triggering or aggravating factors. No food, caffeine , hormonal, sleep or stress triggers. Patient has tried Motrin , BC  powder, Tylenol , Aleve , Excedrin migraine in the past for headache treatment, with mild relief. Patient has remote history of Bell's palsy on the right side 2003 with incomplete recovery. Patient has had intermittent bilateral hand numbness in the past.   Observations/Objective:  Generalized: Well developed, in no acute distress  Mentation: Alert oriented to time, place, history taking. Follows all commands speech and language fluent   Assessment and Plan:  48 y.o. year old female  has a past medical history of Allergy, Anxiety, H/O Bell's palsy, Hypertension, and Migraine aura, persistent. here with    ICD-10-CM   1. Migraine with aura and without status migrainosus, not intractable  G43.109 Fremanezumab -vfrm (AJOVY ) 225 MG/1.5ML SOAJ    2. Migraine without status migrainosus, not intractable, unspecified migraine type  G43.909 Fremanezumab -vfrm (AJOVY ) 225 MG/1.5ML SOAJ      Katrese reports an increase in headache intensity and frequency. We will continue Ajovy  every 30 days. We will have her increase topiramate  to 100mg  twice daily for the next 6-12 weeks. May wean back down if doing well. PCP currently filling topiramate  and pt requests to leave rx as is for now until we determine need to continue higher dose. She will call me if refills are needed. She will continue Nurtec 75mg  daily as needed. Healthy lifestyle habits encouraged. She will follow up in 6-12 months,  sooner if needed.    No orders of the defined types were placed in this encounter.   Meds ordered this encounter  Medications   Rimegepant Sulfate (NURTEC) 75 MG TBDP    Sig: Take 1 tablet (75 mg total) by mouth daily as needed (take for abortive therapy of migraine, no more than 1 tablet in 24 hours or 10 per month).    Dispense:  10 tablet    Refill:  11    Supervising Provider:   AHERN, ANTONIA B [id number]   Fremanezumab -vfrm (AJOVY ) 225 MG/1.5ML SOAJ    Sig: Inject 225 mg into the skin every 30 (thirty) days.     Dispense:  4.5 mL    Refill:  3    #4.76ml per 90 days    Supervising Provider:   INES ONETHA NOVAK [[id number]]     Follow Up Instructions:  I discussed the assessment and treatment plan with the patient. The patient was provided an opportunity to ask questions and all were answered. The patient agreed with the plan and demonstrated an understanding of the instructions.   The patient was advised to call back or seek an in-person evaluation if the symptoms worsen or if the condition fails to improve as anticipated.  I provided 25 minutes of non-face-to-face time during this encounter. Patient located at their place of residence during Mychart visit. Provider is in the office.    Keidrick Murty, NP

## 2023-02-11 NOTE — Patient Instructions (Signed)
 Below is our plan:  We will continue Ajovy  every month. Increase topiramate  to 100mg  twice daily for the next 6-12 weeks. If doing better, we can slowly wean dose back to 50mg  twice daily. Let me know if you need a refill on this medication. Continue Nurtec as needed.   Please make sure you are staying well hydrated. I recommend 50-60 ounces daily. Well balanced diet and regular exercise encouraged. Consistent sleep schedule with 6-8 hours recommended.   Please continue follow up with care team as directed.   Follow up with me in 6 months   You may receive a survey regarding today's visit. I encourage you to leave honest feed back as I do use this information to improve patient care. Thank you for seeing me today!   GENERAL HEADACHE INFORMATION:   Natural supplements: Magnesium Oxide or Magnesium Glycinate 500 mg at bed (up to 800 mg daily) Coenzyme Q10 300 mg in AM Vitamin B2- 200 mg twice a day   Add 1 supplement at a time since even natural supplements can have undesirable side effects. You can sometimes buy supplements cheaper (especially Coenzyme Q10) at www.webmailguide.co.za or at [facility].  Migraine with aura: There is increased risk for stroke in women with migraine with aura and a contraindication for the combined contraceptive pill for use by women who have migraine with aura. The risk for women with migraine without aura is lower. However other risk factors like smoking are far more likely to increase stroke risk than migraine. There is a recommendation for no smoking and for the use of OCPs without estrogen such as progestogen only pills particularly for women with migraine with aura.SABRA People who have migraine headaches with auras may be 3 times more likely to have a stroke caused by a blood clot, compared to migraine patients who don't see auras. Women who take hormone-replacement therapy may be 30 percent more likely to suffer a clot-based stroke than women not taking medication containing  estrogen. Other risk factors like smoking and high blood pressure may be  much more important.    Vitamins and herbs that show potential:   Magnesium: Magnesium (250 mg twice a day or 500 mg at bed) has a relaxant effect on smooth muscles such as blood vessels. Individuals suffering from frequent or daily headache usually have low magnesium levels which can be increase with daily supplementation of 400-750 mg. Three trials found 40-90% average headache reduction  when used as a preventative. Magnesium may help with headaches are aura, the best evidence for magnesium is for migraine with aura is its thought to stop the cortical spreading depression we believe is the pathophysiology of migraine aura.Magnesium also demonstrated the benefit in menstrually related migraine.  Magnesium is part of the messenger system in the serotonin cascade and it is a good muscle relaxant.  It is also useful for constipation which can be a side effect of other medications used to treat migraine. Good sources include nuts, whole grains, and tomatoes. Side Effects: loose stool/diarrhea  Riboflavin (vitamin B 2) 200 mg twice a day. This vitamin assists nerve cells in the production of ATP a principal energy storing molecule.  It is necessary for many chemical reactions in the body.  There have been at least 3 clinical trials of riboflavin using 400 mg per day all of which suggested that migraine frequency can be decreased.  All 3 trials showed significant improvement in over half of migraine sufferers.  The supplement is found in bread, cereal,  milk, meat, and poultry.  Most Americans get more riboflavin than the recommended daily allowance, however riboflavin deficiency is not necessary for the supplements to help prevent headache. Side effects: energizing, green urine   Coenzyme Q10: This is present in almost all cells in the body and is critical component for the conversion of energy.  Recent studies have shown that a  nutritional supplement of CoQ10 can reduce the frequency of migraine attacks by improving the energy production of cells as with riboflavin.  Doses of 150 mg twice a day have been shown to be effective.   Melatonin: Increasing evidence shows correlation between melatonin secretion and headache conditions.  Melatonin supplementation has decreased headache intensity and duration.  It is widely used as a sleep aid.  Sleep is natures way of dealing with migraine.  A dose of 3 mg is recommended to start for headaches including cluster headache. Higher doses up to 15 mg has been reviewed for use in Cluster headache and have been used. The rationale behind using melatonin for cluster is that many theories regarding the cause of Cluster headache center around the disruption of the normal circadian rhythm in the brain.  This helps restore the normal circadian rhythm.   HEADACHE DIET: Foods and beverages which may trigger migraine Note that only 20% of headache patients are food sensitive. You will know if you are food sensitive if you get a headache consistently 20 minutes to 2 hours after eating a certain food. Only cut out a food if it causes headaches, otherwise you might remove foods you enjoy! What matters most for diet is to eat a well balanced healthy diet full of vegetables and low fat protein, and to not miss meals.   Chocolate, other sweets ALL cheeses except cottage and cream cheese Dairy products, yogurt, sour cream, ice cream Liver Meat extracts (Bovril, Marmite, meat tenderizers) Meats or fish which have undergone aging, fermenting, pickling or smoking. These include: Hotdogs,salami,Lox,sausage, mortadellas,smoked salmon, pepperoni, Pickled herring Pods of broad bean (English beans, Chinese pea pods, Italian (fava) beans, lima and navy beans Ripe avocado, ripe banana Yeast extracts or active yeast preparations such as Brewer's or Fleishman's (commercial bakes goods are permitted) Tomato based  foods, pizza (lasagna, etc.)   MSG (monosodium glutamate) is disguised as many things; look for these common aliases: Monopotassium glutamate Autolysed yeast Hydrolysed protein Sodium caseinate "flavorings" "all natural preservatives Nutrasweet   Avoid all other foods that convincingly provoke headaches.   Resources: The Dizzy Bluford Aid Your Headache Diet, migrainestrong.com  https://zamora-andrews.com/   Caffeine  and Migraine For patients that have migraine, caffeine  intake more than 3 days per week can lead to dependency and increased migraine frequency. I would recommend cutting back on your caffeine  intake as best you can. The recommended amount of caffeine  is 200-300 mg daily, although migraine patients may experience dependency at even lower doses. While you may notice an increase in headache temporarily, cutting back will be helpful for headaches in the long run. For more information on caffeine  and migraine, visit: https://americanmigrainefoundation.org/resource-library/caffeine -and-migraine/   Headache Prevention Strategies:   1. Maintain a headache diary; learn to identify and avoid triggers.  - This can be a simple note where you log when you had a headache, associated symptoms, and medications used - There are several smartphone apps developed to help track migraines: Migraine Buddy, Migraine Monitor, Curelator N1-Headache App   Common triggers include: Emotional triggers: Emotional/Upset family or friends Emotional/Upset occupation Business reversal/success Anticipation anxiety Crisis-serious Post-crisis periodNew job/position  Physical triggers: Vacation Day Weekend Strenuous Exercise High Altitude Location New Move Menstrual Day Physical Illness Oversleep/Not enough sleep Weather changes Light: Photophobia or light sesnitivity treatment involves a balance between desensitization and reduction in overly strong  input. Use dark polarized glasses outside, but not inside. Avoid bright or fluorescent light, but do not dim environment to the point that going into a normally lit room hurts. Consider FL-41 tint lenses, which reduce the most irritating wavelengths without blocking too much light.  These can be obtained at axonoptics.com or theraspecs.com Foods: see list above.   2. Limit use of acute treatments (over-the-counter medications, triptans, etc.) to no more than 2 days per week or 10 days per month to prevent medication overuse headache (rebound headache).     3. Follow a regular schedule (including weekends and holidays): Don't skip meals. Eat a balanced diet. 8 hours of sleep nightly. Minimize stress. Exercise 30 minutes per day. Being overweight is associated with a 5 times increased risk of chronic migraine. Keep well hydrated and drink 6-8 glasses of water per day.   4. Initiate non-pharmacologic measures at the earliest onset of your headache. Rest and quiet environment. Relax and reduce stress. Breathe2Relax is a free app that can instruct you on    some simple relaxtion and breathing techniques. Http://Dawnbuse.com is a    free website that provides teaching videos on relaxation.  Also, there are  many apps that   can be downloaded for "mindful" relaxation.  An app called YOGA NIDRA will help walk you through mindfulness. Another app called Calm can be downloaded to give you a structured mindfulness guide with daily reminders and skill development. Headspace for guided meditation Mindfulness Based Stress Reduction Online Course: www.palousemindfulness.com Cold compresses.   5. Don't wait!! Take the maximum allowable dosage of prescribed medication at the first sign of migraine.   6. Compliance:  Take prescribed medication regularly as directed and at the first sign of a migraine.   7. Communicate:  Call your physician when problems arise, especially if your headaches change, increase in  frequency/severity, or become associated with neurological symptoms (weakness, numbness, slurred speech, etc.). Proceed to emergency room if you experience new or worsening symptoms or symptoms do not resolve, if you have new neurologic symptoms or if headache is severe, or for any concerning symptom.   8. Headache/pain management therapies: Consider various complementary methods, including medication, behavioral therapy, psychological counselling, biofeedback, massage therapy, acupuncture, dry needling, and other modalities.  Such measures may reduce the need for medications. Counseling for pain management, where patients learn to function and ignore/minimize their pain, seems to work very well.   9. Recommend changing family's attention and focus away from patient's headaches. Instead, emphasize daily activities. If first question of day is 'How are your headaches/Do you have a headache today?', then patient will constantly think about headaches, thus making them worse. Goal is to re-direct attention away from headaches, toward daily activities and other distractions.   10. Helpful Websites: www.AmericanHeadacheSociety.org patenthood.ch www.headaches.org tightmarket.nl www.achenet.org

## 2023-02-12 ENCOUNTER — Telehealth: Payer: Self-pay | Admitting: Family Medicine

## 2023-02-12 NOTE — Telephone Encounter (Signed)
Kylie Galloway) said has patient on the other line. Was told by patient was quoted a $126 for a upcoming appointment. Asking clarification what the amount is for. Transferred to Billing

## 2023-02-14 ENCOUNTER — Encounter: Payer: Self-pay | Admitting: Family Medicine

## 2023-02-14 ENCOUNTER — Telehealth (INDEPENDENT_AMBULATORY_CARE_PROVIDER_SITE_OTHER): Payer: 59 | Admitting: Family Medicine

## 2023-02-14 DIAGNOSIS — G43109 Migraine with aura, not intractable, without status migrainosus: Secondary | ICD-10-CM | POA: Diagnosis not present

## 2023-02-14 DIAGNOSIS — G43909 Migraine, unspecified, not intractable, without status migrainosus: Secondary | ICD-10-CM

## 2023-02-14 MED ORDER — AJOVY 225 MG/1.5ML ~~LOC~~ SOAJ: 225.0000 mg | SUBCUTANEOUS | 3 refills | Status: DC

## 2023-02-14 MED ORDER — NURTEC 75 MG PO TBDP: 75.0000 mg | ORAL_TABLET | Freq: Every day | ORAL | 11 refills | Status: DC | PRN

## 2023-04-18 ENCOUNTER — Encounter: Payer: Self-pay | Admitting: Family Medicine

## 2023-04-18 ENCOUNTER — Other Ambulatory Visit: Payer: Self-pay | Admitting: Family Medicine

## 2023-04-18 DIAGNOSIS — Z1231 Encounter for screening mammogram for malignant neoplasm of breast: Secondary | ICD-10-CM

## 2023-04-18 DIAGNOSIS — I10 Essential (primary) hypertension: Secondary | ICD-10-CM

## 2023-04-18 DIAGNOSIS — F321 Major depressive disorder, single episode, moderate: Secondary | ICD-10-CM

## 2023-04-18 MED ORDER — TOPIRAMATE 50 MG PO TABS: 50.0000 mg | ORAL_TABLET | Freq: Two times a day (BID) | ORAL | 0 refills | Status: DC

## 2023-04-18 MED ORDER — BISOPROLOL FUMARATE 5 MG PO TABS: 5.0000 mg | ORAL_TABLET | Freq: Every day | ORAL | 0 refills | Status: DC

## 2023-04-18 MED ORDER — VENLAFAXINE HCL ER 75 MG PO CP24: 75.0000 mg | ORAL_CAPSULE | Freq: Every day | ORAL | 0 refills | Status: DC

## 2023-04-27 ENCOUNTER — Other Ambulatory Visit: Payer: Self-pay | Admitting: Family Medicine

## 2023-04-27 DIAGNOSIS — I10 Essential (primary) hypertension: Secondary | ICD-10-CM

## 2023-04-27 NOTE — Patient Instructions (Incomplete)
 It was good to see you again today I will be in touch with your labs Try the Allegra / fexofenadine  for your allergies

## 2023-04-27 NOTE — Progress Notes (Unsigned)
 [facility] [address], [address] [place], [place] [address] [phone]  Date:  05/01/2023   Name:  Kylie Galloway   DOB:  [birth year]   MRN:  [id number]  PCP:  Kaylee Partridge, MD    Chief Complaint: No chief complaint on file.   History of Present Illness:  Kylie Galloway is a 48 y.o. very pleasant female patient who presents with the following:  Patient seen today for follow-up.  Most recent visit with myself was in September for her annual physical History of prediabetes, migraine headaches, hypertension  She has GYN care with Dr. Serge Dancer Can start colon cancer screening since she turned 47 She was previously losing weight with GLP-1 but this was stopped due to insurance/payment problems-she might be a candidate for a direct from manufacturer pharmacy program  Patient Active Problem List   Diagnosis Date Noted   Acute non-recurrent pansinusitis 11/06/2021   Prediabetes 11/11/2019   Pneumonia due to COVID-19 virus 01/02/2019   Respiratory failure with hypoxia (HCC) 01/02/2019   Ingrown toenail 10/26/2017   Migraine 12/25/2015   Hypertension 01/24/2012    Past Medical History:  Diagnosis Date   Allergy    Anxiety    H/O Bell's palsy    rt sided   Hypertension    Migraine aura, persistent     Past Surgical History:  Procedure Laterality Date   BREAST BIOPSY Right 02/25/2020   CESAREAN SECTION  01/09/2008    Social History   Tobacco Use   Smoking status: Never   Smokeless tobacco: Never  Substance Use Topics   Alcohol use: No   Drug use: No    Family History  Problem Relation Age of Onset   CAD Paternal Grandfather 75   CAD Paternal Grandmother 58   Diabetes Maternal Grandmother    Diabetes Maternal Grandfather    Hypertension Brother     No Known Allergies  Medication list has been reviewed and updated.  Current Outpatient Medications on File Prior to Visit  Medication Sig Dispense Refill    acetaminophen  (TYLENOL ) 500 MG tablet Take 1,000 mg by mouth every 6 (six) hours as needed for mild pain or fever.     albuterol  (VENTOLIN  HFA) 108 (90 Base) MCG/ACT inhaler Inhale 2 puffs into the lungs every 6 (six) hours as needed for wheezing or shortness of breath. 18 g 5   ALPRAZolam  (XANAX ) 0.25 MG tablet Take 1-2 tablets (0.25-0.5 mg total) by mouth 2 (two) times daily as needed for anxiety. 30 tablet 0   Apple Cider Vinegar 500 MG TABS Take 500 mg by mouth daily.     bisoprolol  (ZEBETA ) 5 MG tablet Take 1 tablet (5 mg total) by mouth daily. 30 tablet 0   cholecalciferol (VITAMIN D3) 25 MCG (1000 UT) tablet Take 1,000 Units by mouth daily.     fluticasone  (FLONASE ) 50 MCG/ACT nasal spray Place 2 sprays into both nostrils daily. 16 g 11   Fremanezumab -vfrm (AJOVY ) 225 MG/1.5ML SOAJ Inject 225 mg into the skin every 30 (thirty) days. 4.5 mL 3   levonorgestrel (MIRENA, 52 MG,) 20 MCG/DAY IUD Mirena 21 mcg/24 hours (8 yrs) 52 mg intrauterine device  Take 1 device by intrauterine route.     meloxicam (MOBIC) 15 MG tablet Take 15 mg by mouth daily.     Omega-3 Fatty Acids (FISH OIL) 1000 MG CAPS Take 1,000 mg by mouth daily.      polyethylene glycol (  MIRALAX  / GLYCOLAX ) 17 g packet Take 17 g by mouth daily. 90 packet 3   Rimegepant Sulfate (NURTEC) 75 MG TBDP Take 1 tablet (75 mg total) by mouth daily as needed (take for abortive therapy of migraine, no more than 1 tablet in 24 hours or 10 per month). 10 tablet 11   silver  sulfADIAZINE  (SILVADENE ) 1 % cream Apply 1 application topically daily. 50 g 0   topiramate  (TOPAMAX ) 50 MG tablet Take 1 tablet (50 mg total) by mouth 2 (two) times daily. 60 tablet 0   triamcinolone  ointment (KENALOG ) 0.5 % Apply 1 application. topically 2 (two) times daily. 30 g 0   triamterene -hydrochlorothiazide  (MAXZIDE-25) 37.5-25 MG tablet TAKE 1 TABLET BY MOUTH DAILY. USE AS NEEDED FOR LEG SWELLING 90 tablet 1   venlafaxine  XR (EFFEXOR -XR) 75 MG 24 hr capsule Take 1  capsule (75 mg total) by mouth daily with breakfast. 30 capsule 0   vitamin B-12 (CYANOCOBALAMIN) 1000 MCG tablet Take 1,000 mcg by mouth daily.     vitamin C (ASCORBIC ACID ) 500 MG tablet Take 1,000 mg by mouth daily.      No current facility-administered medications on file prior to visit.    Review of Systems:  As per HPI- otherwise negative.   Physical Examination: There were no vitals filed for this visit. There were no vitals filed for this visit. There is no height or weight on file to calculate BMI. Ideal Body Weight:    GEN: no acute distress. HEENT: Atraumatic, Normocephalic.  Ears and Nose: No external deformity. CV: RRR, No M/G/R. No JVD. No thrill. No extra heart sounds. PULM: CTA B, no wheezes, crackles, rhonchi. No retractions. No resp. distress. No accessory muscle use. ABD: S, NT, ND, +BS. No rebound. No HSM. EXTR: No c/c/e PSYCH: Normally interactive. Conversant.    Assessment and Plan: ***  Signed Gates Kasal, MD

## 2023-04-29 ENCOUNTER — Encounter: Payer: Self-pay | Admitting: Family Medicine

## 2023-04-29 ENCOUNTER — Other Ambulatory Visit: Payer: Self-pay | Admitting: *Deleted

## 2023-04-29 MED ORDER — TOPIRAMATE 100 MG PO TABS: 100.0000 mg | ORAL_TABLET | Freq: Two times a day (BID) | ORAL | 4 refills | Status: DC

## 2023-05-01 ENCOUNTER — Ambulatory Visit (INDEPENDENT_AMBULATORY_CARE_PROVIDER_SITE_OTHER): Payer: Self-pay | Admitting: Family Medicine

## 2023-05-01 VITALS — BP 120/62 | HR 80 | Temp 97.9°F | Resp 18 | Ht 65.0 in | Wt 172.4 lb

## 2023-05-01 DIAGNOSIS — F321 Major depressive disorder, single episode, moderate: Secondary | ICD-10-CM

## 2023-05-01 DIAGNOSIS — J302 Other seasonal allergic rhinitis: Secondary | ICD-10-CM | POA: Diagnosis not present

## 2023-05-01 DIAGNOSIS — I10 Essential (primary) hypertension: Secondary | ICD-10-CM

## 2023-05-01 DIAGNOSIS — E876 Hypokalemia: Secondary | ICD-10-CM

## 2023-05-01 MED ORDER — TRIAMTERENE-HCTZ 37.5-25 MG PO TABS: 1.0000 | ORAL_TABLET | Freq: Every day | ORAL | 3 refills | Status: AC

## 2023-05-01 MED ORDER — FEXOFENADINE HCL 180 MG PO TABS: 180.0000 mg | ORAL_TABLET | Freq: Every day | ORAL | 3 refills | Status: AC

## 2023-05-01 MED ORDER — FLUTICASONE PROPIONATE 50 MCG/ACT NA SUSP: 2.0000 | Freq: Every day | NASAL | 4 refills | Status: DC

## 2023-05-01 MED ORDER — VENLAFAXINE HCL ER 75 MG PO CP24: 75.0000 mg | ORAL_CAPSULE | Freq: Every day | ORAL | 3 refills | Status: DC

## 2023-05-01 MED ORDER — BISOPROLOL FUMARATE 5 MG PO TABS: 5.0000 mg | ORAL_TABLET | Freq: Every day | ORAL | 3 refills | Status: AC

## 2023-05-02 ENCOUNTER — Other Ambulatory Visit: Payer: Self-pay | Admitting: Family Medicine

## 2023-05-02 ENCOUNTER — Encounter: Payer: Self-pay | Admitting: Family Medicine

## 2023-05-02 DIAGNOSIS — E876 Hypokalemia: Secondary | ICD-10-CM

## 2023-05-02 LAB — BASIC METABOLIC PANEL WITH GFR
BUN: 22 mg/dL (ref 6–23)
CO2: 30 meq/L (ref 19–32)
Calcium: 9.6 mg/dL (ref 8.4–10.5)
Chloride: 98 meq/L (ref 96–112)
Creatinine, Ser: 0.85 mg/dL (ref 0.40–1.20)
GFR: 82.77 mL/min (ref >60.00)
Glucose, Bld: 86 mg/dL (ref 70–99)
Potassium: 2.9 meq/L — ABNORMAL LOW (ref 3.5–5.1)
Sodium: 139 meq/L (ref 135–145)

## 2023-05-02 MED ORDER — POTASSIUM CHLORIDE CRYS ER 20 MEQ PO TBCR: 20.0000 meq | EXTENDED_RELEASE_TABLET | Freq: Two times a day (BID) | ORAL | 0 refills | Status: DC

## 2023-05-02 NOTE — Addendum Note (Signed)
 Addended by: Gates Kasal C on: 05/02/2023 02:31 PM   Modules accepted: Orders

## 2023-05-06 ENCOUNTER — Other Ambulatory Visit (INDEPENDENT_AMBULATORY_CARE_PROVIDER_SITE_OTHER)

## 2023-05-06 ENCOUNTER — Other Ambulatory Visit: Payer: Self-pay | Admitting: Family Medicine

## 2023-05-06 ENCOUNTER — Encounter: Payer: Self-pay | Admitting: Family Medicine

## 2023-05-06 DIAGNOSIS — E876 Hypokalemia: Secondary | ICD-10-CM

## 2023-05-06 LAB — POTASSIUM: Potassium: 4.2 meq/L (ref 3.5–5.1)

## 2023-05-17 ENCOUNTER — Encounter (HOSPITAL_COMMUNITY): Payer: Self-pay

## 2023-05-24 ENCOUNTER — Ambulatory Visit
Admission: RE | Admit: 2023-05-24 | Discharge: 2023-05-24 | Disposition: A | Discharge status: Home / Self Care | Payer: Self-pay | Source: Ambulatory Visit | Attending: Family Medicine | Admitting: Family Medicine

## 2023-05-24 DIAGNOSIS — Z1231 Encounter for screening mammogram for malignant neoplasm of breast: Secondary | ICD-10-CM

## 2023-07-29 ENCOUNTER — Other Ambulatory Visit: Payer: Self-pay

## 2023-07-29 ENCOUNTER — Telehealth: Payer: Self-pay | Admitting: Family Medicine

## 2023-07-29 ENCOUNTER — Ambulatory Visit
Admission: RE | Admit: 2023-07-29 | Discharge: 2023-07-29 | Disposition: A | Discharge status: Home / Self Care | Attending: Physician Assistant | Admitting: Physician Assistant

## 2023-07-29 VITALS — BP 121/83 | HR 68 | Temp 98.3°F | Resp 19 | Ht 65.0 in | Wt 172.4 lb

## 2023-07-29 DIAGNOSIS — G43811 Other migraine, intractable, with status migrainosus: Secondary | ICD-10-CM

## 2023-07-29 MED ORDER — DIPHENHYDRAMINE HCL 50 MG/ML IJ SOLN
50.0000 mg | Freq: Once | INTRAMUSCULAR | Status: AC
  Administered 2023-07-29: 50 mg via INTRAMUSCULAR

## 2023-07-29 MED ORDER — ONDANSETRON HCL 4 MG/2ML IJ SOLN
4.0000 mg | Freq: Once | INTRAMUSCULAR | Status: AC
  Administered 2023-07-29: 4 mg via INTRAMUSCULAR

## 2023-07-29 MED ORDER — SUMATRIPTAN SUCCINATE 6 MG/0.5ML ~~LOC~~ SOLN
6.0000 mg | Freq: Once | SUBCUTANEOUS | Status: AC
  Administered 2023-07-29: 6 mg via SUBCUTANEOUS

## 2023-07-29 NOTE — Discharge Instructions (Addendum)
 You were seen today for concerns of a migraine headache.  We provided a Migraine cocktail of medications to help with your symptoms and hopefully provide relief: Zofran  4 mg injection, Benadryl  50 mg injection and Imitrex  6 mg injection You may feel fatigued and sleepy after these have been administered which is normal. Please do not drive or operate machinery until these symptoms have passed If your migraine is not relieved or improved by these medications or if it gets worse, please go to the ED for further evaluation and management.

## 2023-07-29 NOTE — ED Triage Notes (Signed)
 Pt presents with a chief complaint of migraine x 3 days. Currently rates overall head pain a 9/10. Describes the pain as sharp, shooting, and aching. Pt explains that in the last three days, she has had floaters in her vision and light sensitivity. Has sunglasses on in triage. OTC goody powder, Tylenol , and Ibuprofen  taken with no pain relief.

## 2023-07-29 NOTE — Telephone Encounter (Signed)
 Called pt and relayed Kylie Galloway out today and Dr. Margaret does not have any openings currently. Recommend she got to urgent care for evaluation/treatment today and plan on keeping f/u with Kylie Galloway on 08/20/23. She verbalized understanding.

## 2023-07-29 NOTE — ED Provider Notes (Signed)
 [facility]    CSN: [id number] Arrival date & time: 07/29/23  1148      History   Chief Complaint Chief Complaint  Patient presents with   Migraine    Entered by patient    HPI Kylie Galloway is a 48 y.o. female.   HPI  Pt states she has been having a migraine for the past 3 days She states this initially felt like her typical migraine and has progressed to include her neck and upper back She reports having some floaters and visual aura. She did reach out to Neurology for management but they were not able to see her today and recommended UC She denies facial drooping, speech difficulty, gait abnormalities. She does report generalized weakness and fatigue  Interventions: She has been taking Topiramate  daily, she took her monthly injection around the 7-8th (Ajovy ) She has taken her rescue med, Nurtec  yesterday which helped for about an hour then Migraine came back  She has also tried dual action Tylenol  and BC powder  She has not vomited but does feel nauseous   She reports that in the past she has had a triptan in the ED and this did provide relief    Past Medical History:  Diagnosis Date   Allergy    Anxiety    H/O Bell's palsy    rt sided   Hypertension    Migraine aura, persistent     Patient Active Problem List   Diagnosis Date Noted   Acute non-recurrent pansinusitis 11/06/2021   Prediabetes 11/11/2019   Pneumonia due to COVID-19 virus 01/02/2019   Respiratory failure with hypoxia (HCC) 01/02/2019   Ingrown toenail 10/26/2017   Migraine 12/25/2015   Hypertension 01/24/2012    Past Surgical History:  Procedure Laterality Date   BREAST BIOPSY Right 02/25/2020   CESAREAN SECTION  01/09/2008    OB History   No obstetric history on file.      Home Medications    Prior to Admission medications   Medication Sig Start Date End Date Taking? Authorizing Provider  acetaminophen  (TYLENOL ) 500 MG tablet Take 1,000 mg by mouth every 6 (six)  hours as needed for mild pain or fever.    [provider]  albuterol  (VENTOLIN  HFA) 108 (90 Base) MCG/ACT inhaler Inhale 2 puffs into the lungs every 6 (six) hours as needed for wheezing or shortness of breath. 12/22/21   Copland, Jessica C, MD  Apple Cider Vinegar 500 MG TABS Take 500 mg by mouth daily.    [provider]  bisoprolol  (ZEBETA ) 5 MG tablet Take 1 tablet (5 mg total) by mouth daily. 05/01/23   Copland, Jessica C, MD  cholecalciferol (VITAMIN D3) 25 MCG (1000 UT) tablet Take 1,000 Units by mouth daily.    [provider]  fexofenadine  (ALLEGRA ) 180 MG tablet Take 1 tablet (180 mg total) by mouth daily. Use as needed for allergies 05/01/23   Copland, Harlene BROCKS, MD  fluticasone  (FLONASE ) 50 MCG/ACT nasal spray Place 2 sprays into both nostrils daily. 05/01/23   Copland, Harlene BROCKS, MD  Fremanezumab -vfrm (AJOVY ) 225 MG/1.5ML SOAJ Inject 225 mg into the skin every 30 (thirty) days. 02/14/23   Lomax, Amy, NP  levonorgestrel (MIRENA, 52 MG,) 20 MCG/DAY IUD Mirena 21 mcg/24 hours (8 yrs) 52 mg intrauterine device  Take 1 device by intrauterine route.    [provider]  meloxicam (MOBIC) 15 MG tablet Take 15 mg by mouth daily. 09/25/22   [provider]  Omega-3 Fatty Acids (FISH OIL) 1000 MG CAPS Take 1,000 mg by mouth daily.     [provider]  polyethylene glycol (MIRALAX  / GLYCOLAX ) 17 g packet Take 17 g by mouth daily. 12/25/22   Copland, Harlene BROCKS, MD  potassium chloride  SA (KLOR-CON  M) 20 MEQ tablet Take 1 tablet (20 mEq total) by mouth 2 (two) times daily. 05/02/23   Copland, Harlene BROCKS, MD  Rimegepant Sulfate (NURTEC) 75 MG TBDP Take 1 tablet (75 mg total) by mouth daily as needed (take for abortive therapy of migraine, no more than 1 tablet in 24 hours or 10 per month). 02/14/23   Lomax, Amy, NP  silver  sulfADIAZINE  (SILVADENE ) 1 % cream Apply 1 application topically daily. 04/04/20   Copland, Harlene BROCKS, MD  topiramate  (TOPAMAX ) 100 MG  tablet Take 1 tablet (100 mg total) by mouth 2 (two) times daily. 04/29/23   Lomax, Amy, NP  triamcinolone  ointment (KENALOG ) 0.5 % Apply 1 application. topically 2 (two) times daily. 04/13/21   Copland, Harlene BROCKS, MD  triamterene -hydrochlorothiazide  (MAXZIDE-25) 37.5-25 MG tablet Take 1 tablet by mouth daily. Use as needed for leg swelling 05/01/23   Copland, Harlene BROCKS, MD  venlafaxine  XR (EFFEXOR -XR) 75 MG 24 hr capsule Take 1 capsule (75 mg total) by mouth daily with breakfast. 05/01/23   Copland, Harlene BROCKS, MD  vitamin B-12 (CYANOCOBALAMIN) 1000 MCG tablet Take 1,000 mcg by mouth daily.    [provider]  vitamin C (ASCORBIC ACID ) 500 MG tablet Take 1,000 mg by mouth daily.     [provider]    Family History Family History  Problem Relation Age of Onset   Diabetes Maternal Grandmother    Diabetes Maternal Grandfather    CAD Paternal Grandmother 5   CAD Paternal Grandfather 19   Hypertension Brother    BRCA 1/2 Neg Hx    Breast cancer Neg Hx     Social History Social History   Tobacco Use   Smoking status: Never   Smokeless tobacco: Never  Vaping Use   Vaping status: Never Used  Substance Use Topics   Alcohol use: No   Drug use: No     Allergies   Patient has no known allergies.   Review of Systems Review of Systems  Constitutional:  Positive for fatigue.  Eyes:  Positive for photophobia and visual disturbance.  Gastrointestinal:  Positive for nausea. Negative for vomiting.  Musculoskeletal:  Positive for neck pain. Negative for gait problem.  Neurological:  Positive for headaches. Negative for tremors, seizures, facial asymmetry, speech difficulty, weakness and numbness.     Physical Exam Triage Vital Signs ED Triage Vitals  Encounter Vitals Group     BP 07/29/23 1217 121/83     Girls Systolic BP Percentile --      Girls Diastolic BP Percentile --      Boys Systolic BP Percentile --      Boys Diastolic BP Percentile --      Pulse Rate  07/29/23 1217 68     Resp 07/29/23 1217 19     Temp 07/29/23 1217 98.3 F (36.8 C)     Temp Source 07/29/23 1217 Oral     SpO2 07/29/23 1217 97 %     Weight 07/29/23 1216 172 lb 6.4 oz (78.2 kg)     Height 07/29/23 1216 5' 5 (1.651 m)     Head Circumference --      Peak Flow --      Pain Score 07/29/23  1216 10     Pain Loc --      Pain Education --      Exclude from Growth Chart --    No data found.  Updated Vital Signs BP 121/83 (BP Location: Right Arm)   Pulse 68   Temp 98.3 F (36.8 C) (Oral)   Resp 19   Ht 5' 5 (1.651 m)   Wt 172 lb 6.4 oz (78.2 kg)   SpO2 97%   BMI 28.69 kg/m   Visual Acuity Right Eye Distance:   Left Eye Distance:   Bilateral Distance:    Right Eye Near:   Left Eye Near:    Bilateral Near:     Physical Exam Vitals reviewed.  Constitutional:      General: She is awake. She is not in acute distress.    Appearance: Normal appearance. She is well-developed and well-groomed. She is not ill-appearing or toxic-appearing.     Comments: Pt is seated in exam chair, wearing sunglasses initially until lights were reduced    HENT:     Head: Normocephalic and atraumatic.  Eyes:     General: Lids are normal. Gaze aligned appropriately.     Extraocular Movements: Extraocular movements intact.     Conjunctiva/sclera: Conjunctivae normal.     Pupils: Pupils are equal, round, and reactive to light.  Pulmonary:     Effort: Pulmonary effort is normal.  Neurological:     Mental Status: She is alert and oriented to person, place, and time.     GCS: GCS eye subscore is 4. GCS verbal subscore is 5. GCS motor subscore is 6.     Cranial Nerves: Facial asymmetry present. No cranial nerve deficit or dysarthria.     Gait: Gait is intact.     Comments: Comments: MENTAL STATUS: AAOx3, memory intact, fund of knowledge appropriate   LANG/SPEECH: Naming and repetition intact, fluent, no dysarthria, follows 3-step commands, answers questions appropriately      CRANIAL NERVES:   II: Pupils equal and reactive, no RAPD   III, IV, VI: EOM intact, no gaze preference or deviation, no nystagmus.   V: normal sensation in V1, V2, and V3 segments bilaterally   VII: no asymmetry, no nasolabial fold flattening   VIII: normal hearing to speech   IX, X: normal palatal elevation, no uvular deviation   XI: 5/5 head turn and 5/5 shoulder shrug bilaterally   XII: midline tongue protrusion  Pt has mild right side facial asymmetry- she states this is from chronic Bells palsey and is normal for her (confirmed dx in chart review)  MOTOR:  5/5 bilateral grip strength 5/5 strength dorsiflexion/plantarflexion b/l   COORD: Normal finger to nose, no tremor, no dysmetria   STATION: normal stance, no truncal ataxia   GAIT: Normal, a bit slowed but normal; patient able to tip-toe, heel-walk.   Psychiatric:        Behavior: Behavior is cooperative.      UC Treatments / Results  Labs (all labs ordered are listed, but only abnormal results are displayed) Labs Reviewed - No data to display  EKG   Radiology No results found.  Procedures Procedures (including critical care time)  Medications Ordered in UC Medications  ondansetron  (ZOFRAN ) injection 4 mg (4 mg Intramuscular Given 07/29/23 1329)  SUMAtriptan  (IMITREX ) injection 6 mg (6 mg Subcutaneous Given 07/29/23 1333)  diphenhydrAMINE  (BENADRYL ) injection 50 mg (50 mg Intramuscular Given 07/29/23 1329)    Initial Impression / Assessment and Plan / UC Course  I have reviewed the triage vital signs and the nursing notes.  Pertinent labs & imaging results that were available during my care of the patient were reviewed by me and considered in my medical decision making (see chart for details).      Final Clinical Impressions(s) / UC Diagnoses   Final diagnoses:  Other migraine with status migrainosus, intractable   Pt presents today with concerns for migraine that has been ongoing for 3 days and is  not responding to home therapies or abortives that pt has been prescribed. Physical exam and neuro exam are largely reassuring. Will provide patient with Migraine cocktail comprised of Benadryl  50 mg , Zofran  4 mg and Sumatriptan  6 mg injections. Recommend rest and hydration for the rest of today and gradual return to normal activity. Reviewed ED and return precautions. Follow up as needed for persistent or progressing symptoms      Discharge Instructions      You were seen today for concerns of a migraine headache.  We provided a Migraine cocktail of medications to help with your symptoms and hopefully provide relief: Zofran  4 mg injection, Benadryl  50 mg injection and Imitrex  6 mg injection You may feel fatigued and sleepy after these have been administered which is normal. Please do not drive or operate machinery until these symptoms have passed If your migraine is not relieved or improved by these medications or if it gets worse, please go to the ED for further evaluation and management.      ED Prescriptions   None    PDMP not reviewed this encounter.   Syan Cullimore, Rocky BRAVO, PA-C 07/29/23 1351

## 2023-07-29 NOTE — Telephone Encounter (Signed)
 Pt called stating she need to see MD today. Pt states she has been having the same migraines  for 3 days and it will not stop.Pt  states that the pain is running from her forehead down to her eyes and in to  body the patient states she can barely function the patient  would  like to be seen today or on video . Pt states the pain is getting worse by day .

## 2023-07-30 ENCOUNTER — Telehealth: Payer: Self-pay | Admitting: Pharmacist

## 2023-07-30 ENCOUNTER — Encounter: Payer: Self-pay | Admitting: Family Medicine

## 2023-07-30 ENCOUNTER — Other Ambulatory Visit (HOSPITAL_BASED_OUTPATIENT_CLINIC_OR_DEPARTMENT_OTHER): Payer: Self-pay

## 2023-07-30 ENCOUNTER — Other Ambulatory Visit (HOSPITAL_COMMUNITY): Payer: Self-pay

## 2023-07-30 ENCOUNTER — Other Ambulatory Visit: Payer: Self-pay | Admitting: Family Medicine

## 2023-07-30 MED ORDER — METHYLPREDNISOLONE 4 MG PO TBPK: ORAL_TABLET | ORAL | 0 refills | Status: DC

## 2023-07-30 MED ORDER — UBRELVY 100 MG PO TABS: 100.0000 mg | ORAL_TABLET | Freq: Every day | ORAL | 11 refills | Status: DC | PRN

## 2023-07-30 NOTE — Telephone Encounter (Signed)
 Pharmacy Patient Advocate Encounter  Received notification from [facility] that Prior Authorization for Ubrelvy  100MG  tablets has been APPROVED from 07/30/2023 to 07/29/2024   PA #/Case ID/Reference #: [id number]

## 2023-07-30 NOTE — Progress Notes (Unsigned)
 [facility] [address] [place], [place] [address] [phone]  Date:  07/31/2023   Name:  Kylie Galloway   DOB:  01-03-1976   MRN:  [id number]  PCP:  Watt Harlene BROCKS, MD    Chief Complaint: No chief complaint on file.   History of Present Illness:  Kylie Galloway is a 48 y.o. very pleasant female patient who presents with the following:  Patient seen today to discuss migraine headaches.  This is not a new problem for her.  Most recent visit with myself was in April Also has history of prediabetes and hypertension She was actually seen at urgent care on 7/21 with a 3-day migraine headache: Pt states she has been having a migraine for the past 3 days She states this initially felt like her typical migraine and has progressed to include her neck and upper back She reports having some floaters and visual aura. She did reach out to Neurology for management but they were not able to see her today and recommended UC She denies facial drooping, speech difficulty, gait abnormalities. She does report generalized weakness and fatigue  Interventions: She has been taking Topiramate  daily, she took her monthly injection around the 7-8th (Ajovy ) She has taken her rescue med, Nurtec  yesterday which helped for about an hour then Migraine came back  She has also tried dual action Tylenol  and BC powder  She has not vomited but does feel nauseous   She was given an injection of Benadryl , ondansetron , sumatriptan  hand and felt better  She did also reach out to her neurologist to see if they might be able to get her in  Her most recent routine neurology follow-up was in [place] has been using Ajovy  and Nurtec as needed Patient Active Problem List   Diagnosis Date Noted   Acute non-recurrent pansinusitis 11/06/2021   Prediabetes 11/11/2019   Pneumonia due to COVID-19 virus 01/02/2019   Respiratory failure with hypoxia (HCC)  01/02/2019   Ingrown toenail 10/26/2017   Migraine 12/25/2015   Hypertension 01/24/2012    Past Medical History:  Diagnosis Date   Allergy    Anxiety    H/O Bell's palsy    rt sided   Hypertension    Migraine aura, persistent     Past Surgical History:  Procedure Laterality Date   BREAST BIOPSY Right 02/25/2020   CESAREAN SECTION  01/09/2008    Social History   Tobacco Use   Smoking status: Never   Smokeless tobacco: Never  Vaping Use   Vaping status: Never Used  Substance Use Topics   Alcohol use: No   Drug use: No    Family History  Problem Relation Age of Onset   Diabetes Maternal Grandmother    Diabetes Maternal Grandfather    CAD Paternal Grandmother 76   CAD Paternal Grandfather 23   Hypertension Brother    BRCA 1/2 Neg Hx    Breast cancer Neg Hx     No Known Allergies  Medication list has been reviewed and updated.  Current Outpatient Medications on File Prior to Visit  Medication Sig Dispense Refill   acetaminophen  (TYLENOL ) 500 MG tablet Take 1,000 mg by mouth every 6 (six) hours as needed for mild pain or fever.     albuterol  (VENTOLIN  HFA) 108 (90 Base) MCG/ACT inhaler Inhale 2 puffs into the lungs every 6 (six) hours as needed for wheezing or shortness of breath. 18  g 5   Apple Cider Vinegar 500 MG TABS Take 500 mg by mouth daily.     bisoprolol  (ZEBETA ) 5 MG tablet Take 1 tablet (5 mg total) by mouth daily. 90 tablet 3   cholecalciferol (VITAMIN D3) 25 MCG (1000 UT) tablet Take 1,000 Units by mouth daily.     fexofenadine  (ALLEGRA ) 180 MG tablet Take 1 tablet (180 mg total) by mouth daily. Use as needed for allergies 90 tablet 3   fluticasone  (FLONASE ) 50 MCG/ACT nasal spray Place 2 sprays into both nostrils daily. 43 g 4   Fremanezumab -vfrm (AJOVY ) 225 MG/1.5ML SOAJ Inject 225 mg into the skin every 30 (thirty) days. 4.5 mL 3   levonorgestrel (MIRENA, 52 MG,) 20 MCG/DAY IUD Mirena 21 mcg/24 hours (8 yrs) 52 mg intrauterine device  Take 1  device by intrauterine route.     meloxicam (MOBIC) 15 MG tablet Take 15 mg by mouth daily.     Omega-3 Fatty Acids (FISH OIL) 1000 MG CAPS Take 1,000 mg by mouth daily.      polyethylene glycol (MIRALAX  / GLYCOLAX ) 17 g packet Take 17 g by mouth daily. 90 packet 3   potassium chloride  SA (KLOR-CON  M) 20 MEQ tablet Take 1 tablet (20 mEq total) by mouth 2 (two) times daily. 20 tablet 0   Rimegepant Sulfate (NURTEC) 75 MG TBDP Take 1 tablet (75 mg total) by mouth daily as needed (take for abortive therapy of migraine, no more than 1 tablet in 24 hours or 10 per month). 10 tablet 11   silver  sulfADIAZINE  (SILVADENE ) 1 % cream Apply 1 application topically daily. 50 g 0   topiramate  (TOPAMAX ) 100 MG tablet Take 1 tablet (100 mg total) by mouth 2 (two) times daily. 60 tablet 4   triamcinolone  ointment (KENALOG ) 0.5 % Apply 1 application. topically 2 (two) times daily. 30 g 0   triamterene -hydrochlorothiazide  (MAXZIDE-25) 37.5-25 MG tablet Take 1 tablet by mouth daily. Use as needed for leg swelling 90 tablet 3   venlafaxine  XR (EFFEXOR -XR) 75 MG 24 hr capsule Take 1 capsule (75 mg total) by mouth daily with breakfast. 90 capsule 3   vitamin B-12 (CYANOCOBALAMIN) 1000 MCG tablet Take 1,000 mcg by mouth daily.     vitamin C (ASCORBIC ACID ) 500 MG tablet Take 1,000 mg by mouth daily.      No current facility-administered medications on file prior to visit.    Review of Systems:  As per HPI- otherwise negative.   Physical Examination: There were no vitals filed for this visit. There were no vitals filed for this visit. There is no height or weight on file to calculate BMI. Ideal Body Weight:    GEN: no acute distress. HEENT: Atraumatic, Normocephalic.  Ears and Nose: No external deformity. CV: RRR, No M/G/R. No JVD. No thrill. No extra heart sounds. PULM: CTA B, no wheezes, crackles, rhonchi. No retractions. No resp. distress. No accessory muscle use. ABD: S, NT, ND, +BS. No rebound. No  HSM. EXTR: No c/c/e PSYCH: Normally interactive. Conversant.    Assessment and Plan: ***  Signed Harlene Schroeder, MD

## 2023-07-30 NOTE — Telephone Encounter (Signed)
 Pharmacy Patient Advocate Encounter   Received notification from Patient Pharmacy that prior authorization for Ubrelvy  100MG  tablets is required/requested.   Insurance verification completed.   The patient is insured through [facility] .   Per test claim: PA required; PA submitted to above mentioned insurance via CoverMyMeds Key/confirmation #/EOC [id number] Status is pending

## 2023-07-31 ENCOUNTER — Ambulatory Visit (HOSPITAL_BASED_OUTPATIENT_CLINIC_OR_DEPARTMENT_OTHER)
Admission: RE | Admit: 2023-07-31 | Discharge: 2023-07-31 | Disposition: A | Discharge status: Home / Self Care | Source: Ambulatory Visit | Attending: Family Medicine | Admitting: Family Medicine

## 2023-07-31 ENCOUNTER — Ambulatory Visit (INDEPENDENT_AMBULATORY_CARE_PROVIDER_SITE_OTHER): Admitting: Family Medicine

## 2023-07-31 ENCOUNTER — Encounter: Payer: Self-pay | Admitting: Family Medicine

## 2023-07-31 VITALS — BP 124/78 | HR 73 | Ht 65.0 in | Wt 165.0 lb

## 2023-07-31 DIAGNOSIS — R519 Headache, unspecified: Secondary | ICD-10-CM | POA: Diagnosis present

## 2023-07-31 MED ORDER — CYCLOBENZAPRINE HCL 10 MG PO TABS: 10.0000 mg | ORAL_TABLET | Freq: Two times a day (BID) | ORAL | 0 refills | Status: DC | PRN

## 2023-07-31 NOTE — Patient Instructions (Addendum)
 I am sorry you are having such a hard time with headache!  We will get a head CT for you- please head to imaging on the ground floor now   I think the prednisone  taper and the new rescue makes sense. I also gave you some flexeril  (muscle relaxer) to use as needed for neck pain- it may cause drowsiness so use caution!

## 2023-08-01 ENCOUNTER — Encounter: Payer: Self-pay | Admitting: Family Medicine

## 2023-08-01 LAB — COMPREHENSIVE METABOLIC PANEL WITH GFR
ALT: 15 U/L (ref 0–35)
AST: 17 U/L (ref 0–37)
Albumin: 4.6 g/dL (ref 3.5–5.2)
Alkaline Phosphatase: 53 U/L (ref 39–117)
BUN: 13 mg/dL (ref 6–23)
CO2: 26 meq/L (ref 19–32)
Calcium: 10.1 mg/dL (ref 8.4–10.5)
Chloride: 103 meq/L (ref 96–112)
Creatinine, Ser: 0.85 mg/dL (ref 0.40–1.20)
GFR: 82.62 mL/min (ref >60.00)
Glucose, Bld: 92 mg/dL (ref 70–99)
Potassium: 4 meq/L (ref 3.5–5.1)
Sodium: 141 meq/L (ref 135–145)
Total Bilirubin: 0.5 mg/dL (ref 0.2–1.2)
Total Protein: 7.1 g/dL (ref 6.0–8.3)

## 2023-08-01 LAB — CBC
HCT: 43.9 % (ref 36.0–46.0)
Hemoglobin: 15.1 g/dL — ABNORMAL HIGH (ref 12.0–15.0)
MCHC: 34.5 g/dL (ref 30.0–36.0)
MCV: 82.3 fl (ref 78.0–100.0)
Platelets: 288 K/uL (ref 150.0–400.0)
RBC: 5.33 Mil/uL — ABNORMAL HIGH (ref 3.87–5.11)
RDW: 13.7 % (ref 11.5–15.5)
WBC: 10.3 K/uL (ref 4.0–10.5)

## 2023-08-01 LAB — SEDIMENTATION RATE: Sed Rate: 3 mm/h (ref 0–20)

## 2023-08-19 NOTE — Patient Instructions (Addendum)
 Below is our plan:  We will continue topiramate  and Nurtec as prescribed. I will switch your injection to a daily tablet called Qulipta . Take 60mg  daily. Monitor for constipation or nausea. Let me know if you have any trouble getting the medication.   Please make sure you are staying well hydrated. I recommend 50-60 ounces daily. Well balanced diet and regular exercise encouraged. Consistent sleep schedule with 6-8 hours recommended.   Please continue follow up with care team as directed.   Follow up with me in 1 year  You may receive a survey regarding today's visit. I encourage you to leave honest feed back as I do use this information to improve patient care. Thank you for seeing me today!   GENERAL HEADACHE INFORMATION:   Natural supplements: Magnesium Oxide or Magnesium Glycinate 500 mg at bed (up to 800 mg daily) Coenzyme Q10 300 mg in AM Vitamin B2- 200 mg twice a day   Add 1 supplement at a time since even natural supplements can have undesirable side effects. You can sometimes buy supplements cheaper (especially Coenzyme Q10) at www.WebmailGuide.co.za or at [facility].  Migraine with aura: There is increased risk for stroke in women with migraine with aura and a contraindication for the combined contraceptive pill for use by women who have migraine with aura. The risk for women with migraine without aura is lower. However other risk factors like smoking are far more likely to increase stroke risk than migraine. There is a recommendation for no smoking and for the use of OCPs without estrogen such as progestogen only pills particularly for women with migraine with aura.SABRA People who have migraine headaches with auras may be 3 times more likely to have a stroke caused by a blood clot, compared to migraine patients who don't see auras. Women who take hormone-replacement therapy may be 30 percent more likely to suffer a clot-based stroke than women not taking medication containing estrogen. Other risk  factors like smoking and high blood pressure may be  much more important.    Vitamins and herbs that show potential:   Magnesium: Magnesium (250 mg twice a day or 500 mg at bed) has a relaxant effect on smooth muscles such as blood vessels. Individuals suffering from frequent or daily headache usually have low magnesium levels which can be increase with daily supplementation of 400-750 mg. Three trials found 40-90% average headache reduction  when used as a preventative. Magnesium may help with headaches are aura, the best evidence for magnesium is for migraine with aura is its thought to stop the cortical spreading depression we believe is the pathophysiology of migraine aura.Magnesium also demonstrated the benefit in menstrually related migraine.  Magnesium is part of the messenger system in the serotonin cascade and it is a good muscle relaxant.  It is also useful for constipation which can be a side effect of other medications used to treat migraine. Good sources include nuts, whole grains, and tomatoes. Side Effects: loose stool/diarrhea  Riboflavin (vitamin B 2) 200 mg twice a day. This vitamin assists nerve cells in the production of ATP a principal energy storing molecule.  It is necessary for many chemical reactions in the body.  There have been at least 3 clinical trials of riboflavin using 400 mg per day all of which suggested that migraine frequency can be decreased.  All 3 trials showed significant improvement in over half of migraine sufferers.  The supplement is found in bread, cereal, milk, meat, and poultry.  Most Americans get  more riboflavin than the recommended daily allowance, however riboflavin deficiency is not necessary for the supplements to help prevent headache. Side effects: energizing, green urine   Coenzyme Q10: This is present in almost all cells in the body and is critical component for the conversion of energy.  Recent studies have shown that a nutritional supplement of CoQ10  can reduce the frequency of migraine attacks by improving the energy production of cells as with riboflavin.  Doses of 150 mg twice a day have been shown to be effective.   Melatonin: Increasing evidence shows correlation between melatonin secretion and headache conditions.  Melatonin supplementation has decreased headache intensity and duration.  It is widely used as a sleep aid.  Sleep is natures way of dealing with migraine.  A dose of 3 mg is recommended to start for headaches including cluster headache. Higher doses up to 15 mg has been reviewed for use in Cluster headache and have been used. The rationale behind using melatonin for cluster is that many theories regarding the cause of Cluster headache center around the disruption of the normal circadian rhythm in the brain.  This helps restore the normal circadian rhythm.   HEADACHE DIET: Foods and beverages which may trigger migraine Note that only 20% of headache patients are food sensitive. You will know if you are food sensitive if you get a headache consistently 20 minutes to 2 hours after eating a certain food. Only cut out a food if it causes headaches, otherwise you might remove foods you enjoy! What matters most for diet is to eat a well balanced healthy diet full of vegetables and low fat protein, and to not miss meals.   Chocolate, other sweets ALL cheeses except cottage and cream cheese Dairy products, yogurt, sour cream, ice cream Liver Meat extracts (Bovril, Marmite, meat tenderizers) Meats or fish which have undergone aging, fermenting, pickling or smoking. These include: Hotdogs,salami,Lox,sausage, mortadellas,smoked salmon, pepperoni, Pickled herring Pods of broad bean (English beans, Chinese pea pods, [place] (fava) beans, lima and navy beans Ripe avocado, ripe banana Yeast extracts or active yeast preparations such as Brewer's or Fleishman's (commercial bakes goods are permitted) Tomato based foods, pizza (lasagna, etc.)    MSG (monosodium glutamate) is disguised as many things; look for these common aliases: Monopotassium glutamate Autolysed yeast Hydrolysed protein Sodium caseinate "flavorings" "all natural preservatives Nutrasweet   Avoid all other foods that convincingly provoke headaches.   Resources: The Dizzy Bluford Aid Your Headache Diet, migrainestrong.com  https://zamora-andrews.com/   Caffeine  and Migraine For patients that have migraine, caffeine  intake more than 3 days per week can lead to dependency and increased migraine frequency. I would recommend cutting back on your caffeine  intake as best you can. The recommended amount of caffeine  is 200-300 mg daily, although migraine patients may experience dependency at even lower doses. While you may notice an increase in headache temporarily, cutting back will be helpful for headaches in the long run. For more information on caffeine  and migraine, visit: https://americanmigrainefoundation.org/resource-library/caffeine -and-migraine/   Headache Prevention Strategies:   1. Maintain a headache diary; learn to identify and avoid triggers.  - This can be a simple note where you log when you had a headache, associated symptoms, and medications used - There are several smartphone apps developed to help track migraines: Migraine Buddy, Migraine Monitor, Curelator N1-Headache App   Common triggers include: Emotional triggers: Emotional/Upset family or friends Emotional/Upset occupation Business reversal/success Anticipation anxiety Crisis-serious Post-crisis periodNew job/position   Physical triggers: Vacation Day Weekend Strenuous Exercise  High Altitude Location New Move Menstrual Day Physical Illness Oversleep/Not enough sleep Weather changes Light: Photophobia or light sesnitivity treatment involves a balance between desensitization and reduction in overly strong input. Use dark polarized  glasses outside, but not inside. Avoid bright or fluorescent light, but do not dim environment to the point that going into a normally lit room hurts. Consider FL-41 tint lenses, which reduce the most irritating wavelengths without blocking too much light.  These can be obtained at axonoptics.com or theraspecs.com Foods: see list above.   2. Limit use of acute treatments (over-the-counter medications, triptans, etc.) to no more than 2 days per week or 10 days per month to prevent medication overuse headache (rebound headache).     3. Follow a regular schedule (including weekends and holidays): Don't skip meals. Eat a balanced diet. 8 hours of sleep nightly. Minimize stress. Exercise 30 minutes per day. Being overweight is associated with a 5 times increased risk of chronic migraine. Keep well hydrated and drink 6-8 glasses of water per day.   4. Initiate non-pharmacologic measures at the earliest onset of your headache. Rest and quiet environment. Relax and reduce stress. Breathe2Relax is a free app that can instruct you on    some simple relaxtion and breathing techniques. Http://Dawnbuse.com is a    free website that provides teaching videos on relaxation.  Also, there are  many apps that   can be downloaded for "mindful" relaxation.  An app called YOGA NIDRA will help walk you through mindfulness. Another app called Calm can be downloaded to give you a structured mindfulness guide with daily reminders and skill development. Headspace for guided meditation Mindfulness Based Stress Reduction Online Course: www.palousemindfulness.com Cold compresses.   5. Don't wait!! Take the maximum allowable dosage of prescribed medication at the first sign of migraine.   6. Compliance:  Take prescribed medication regularly as directed and at the first sign of a migraine.   7. Communicate:  Call your physician when problems arise, especially if your headaches change, increase in frequency/severity, or become  associated with neurological symptoms (weakness, numbness, slurred speech, etc.). Proceed to emergency room if you experience new or worsening symptoms or symptoms do not resolve, if you have new neurologic symptoms or if headache is severe, or for any concerning symptom.   8. Headache/pain management therapies: Consider various complementary methods, including medication, behavioral therapy, psychological counselling, biofeedback, massage therapy, acupuncture, dry needling, and other modalities.  Such measures may reduce the need for medications. Counseling for pain management, where patients learn to function and ignore/minimize their pain, seems to work very well.   9. Recommend changing family's attention and focus away from patient's headaches. Instead, emphasize daily activities. If first question of day is 'How are your headaches/Do you have a headache today?', then patient will constantly think about headaches, thus making them worse. Goal is to re-direct attention away from headaches, toward daily activities and other distractions.   10. Helpful Websites: www.AmericanHeadacheSociety.org PatentHood.ch www.headaches.org TightMarket.nl www.achenet.org

## 2023-08-19 NOTE — Progress Notes (Signed)
 PATIENT: Kylie Galloway DOB: 07-01-1975  REASON FOR VISIT: follow up HISTORY FROM: patient  Virtual Visit via Telephone Note  I connected with Chaela O Uemura on 08/20/23 at  8:30 AM EDT by telephone and verified that I am speaking with the correct person using two identifiers.   I discussed the limitations, risks, security and privacy concerns of performing an evaluation and management service by telephone and the availability of in person appointments. I also discussed with the patient that there may be a patient responsible charge related to this service. The patient expressed understanding and agreed to proceed.   History of Present Illness:  08/20/23 ALL (Mychart): Kylie Galloway returns for follow up for migraines. She was last seen 02/2023 and experiencing more headaches. We increased topiramate  to 100mg  BID and continued Ajovy  and Nurtec. Since, she reports doing better for a few months. She noted about a month ago headaches increased in frequency and intensity. She had an intractable headache about 3 weeks ago minimally responsive to Nurtec, migraine cocktail or steroid taper. She was seen by PCP who ordered CT scan. Scan unremarkable. We called in Ubrelvy  but Nurtec seems to work faster. She is having about 15 headaches, most all are migrainous. She is having more aura (visual disturbance and dizziness). Uses Mirena. Daughter is taking Qulipta  and doing well.   02/14/2023 (Mychart):  Kylie Galloway returns for follow up for migraines. She was last seen 02/2022 and reported more headaches on Ajovy , topiramate  50mg  BID, amitriptyline  50mg  at bedtime and Nurtec PRN. We switched Ajovy  to Emgality . She took one injection but did not feel it was effective and we switched her back to Ajovy . Since, she reports doing well until about 2-3 months ago. She has had new stressors and PCP adjusted mood management meds. Amitriptyline  discontinued and venlafaxine  started. Headaches gradually started worsening. Over the past  2-3 weeks, she is having daily headaches. Most are migrainous. Nurtec works well for abortive therapy but makes her sleepy.   03/07/2022 ALL (Mychart): Kylie Galloway is a 48 y.o. female here today for follow up for migraines. She continues Ajovy , topiramate  50mg  BID and amitriptyline  50mg  at bedtime. Nurtec helps with abortive therapy. She feels that migraines are fairly well managed. She has noticed an increase in headache days over the past couple of months. Previously having rare migraines but now having several every month. Nurtec does help. Some headaches linger for days. No obvious triggers.   Meds tried and failed: Ajovy  (not as effective), topiramate  (on now), amitriptyline  (on now), Nurtec, rizatriptan , Excedrin   02/15/21 ALL: Kylie Galloway returns for follow up for migraines. She continues Ajovy , topiramate  50mg  BID and amitriptyline  50mg  QHS. Nurtec used for abortive therapy. She reports having 4-5 headaches in a month. The Nurtec is helpful for her most of the time.    02/15/2020 SS:  Kylie Galloway is a 48 year old female with history of migraine headaches.  She remains on amitriptyline  and Topamax . Ajovy  was added at her last visit due to worsening headaches following Covid.   Headaches are much improved, Ajovy  has been quite helpful.  Has a few more migraines during the last week of the month, into the first week of her injection.  She does great weeks 2 and 3, hardly any headaches.  Stress and weather are triggers.  Nurtec works great for abortive therapy.  Gets 10 tablets a month, never uses her whole supply.  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  No changes to overall  health.  Here today for evaluation unaccompanied.   04/06/2019 ALL: Kylie Galloway is a 48 y.o. female here today for follow up for migraines. She continues amitriptyline  50mg  at bedtime and topiramate  50mg  BID. She was diagnosed with Covid in December and hospitalized for 7 days with pneumonia. She reports that  headaches have worsened since. She is having near daily headaches with 10-12 migrainous days each month. She does have black spots in vision prior to headaches. She has used rizatriptan  with some relief but this makes her sleepy.    She is planning to see ortho in April for carpal tunnel. She is wearing wrist braces without much benefit.     HISTORY: (copied from Dr Chancy note on 10/06/2018)   UPDATE (10/06/18, VRP): Since last visit, doing well. Symptoms are stable. Severity is mild. No alleviating or aggravating factors. Tolerating meds. Only 1-2 HA per month. Weather and allergies can aggravate HA.    UPDATE (09/30/17, VRP): Since last visit, doing well. Symptoms are mild. Severity is mild. No alleviating or aggravating factors. Tolerating meds. Avg 1 HA per month in general. Increasing numbness in hands and feet.   UPDATE (03/25/17, VRP): Since last visit, was doing well until 3-4 months ago; now increasing HA 10-12 per month. Tolerating meds. No alleviating or aggravating factors. Sleep is suboptimal (5-6 hours per night or less).     UPDATE 06/23/15: Since last visit, doing well. HA are reduced. Only needed triptan 3-4 times in last 6 months. Tolerating TPX.    UPDATE 12/16/14: Since last visit 5-8 migraines, likely weather related. No other issues. Overall doing well.   UPDATE 08/10/14: Since last visit, doing better. Only 3 days of migraine since last visit. Tolerating TPX. Rizatriptan  also working well. Overall doing well. MRI brain was normal. Still no specific triggers. Incidentally patient developed small rash on right perioral region 1 week ago, which started out as cold sore. Patient denies any burning, numbness or pain with this rash. Rash seems to be stabilized and no longer expanding.   PRIOR HPI (06/25/14): 48 year old right-handed female here for evaluation of headaches, dizziness, visual changes. Since 2010 patient has had intermittent episodes of headache, frontal, occipital,  sometimes unilateral pressure and throbbing sensation with nausea, photophobia, proceeded by seeing spots and vision changes. She was having 1 or less episodes per month. Gradually these increased uptake 3-4 per month. In 2016 she has had 5 episodes. Recent episodes have changed to include severe dizzy, spinning sensation, lasting much longer than previously. Prior episodes used the last 1-2 hours at a time. Now episodes can last 5 hours up to 2-3 days at a time. No prior diagnosis of migraine. Patient's daughter has some headaches but not officially diagnosed. Patient denies any specific triggering or aggravating factors. No food, caffeine , hormonal, sleep or stress triggers. Patient has tried Motrin , BC powder, Tylenol , Aleve , Excedrin migraine in the past for headache treatment, with mild relief. Patient has remote history of Bell's palsy on the right side 2003 with incomplete recovery. Patient has had intermittent bilateral hand numbness in the past.   Observations/Objective:  Generalized: Well developed, in no acute distress  Mentation: Alert oriented to time, place, history taking. Follows all commands speech and language fluent   Assessment and Plan:  48 y.o. year old female  has a past medical history of Allergy, Anxiety, H/O Bell's palsy, Hypertension, and Migraine aura, persistent. here with    ICD-10-CM   1. Migraine with aura and without status migrainosus, not  intractable  G43.109     2. Migraine without status migrainosus, not intractable, unspecified migraine type  G43.909       Floreine reports an increase in headache intensity and frequency. We will switch Ajovy  to Qulipta  60mg  daily. We will have her continue topiramate  to 100mg  twice daily. May wean back down if doing well. She will continue Nurtec 75mg  daily as needed. Healthy lifestyle habits encouraged. She will follow up in 6-12 months, sooner if needed.    No orders of the defined types were placed in this  encounter.   Meds ordered this encounter  Medications   Rimegepant Sulfate (NURTEC) 75 MG TBDP    Sig: Take 1 tablet (75 mg total) by mouth daily as needed (take for abortive therapy of migraine, no more than 1 tablet in 24 hours or 10 per month).    Dispense:  10 tablet    Refill:  11    Supervising Provider:   AHERN, ANTONIA B [[id number]]   topiramate  (TOPAMAX ) 100 MG tablet    Sig: Take 1 tablet (100 mg total) by mouth 2 (two) times daily.    Dispense:  180 tablet    Refill:  3    Supervising Provider:   AHERN, ANTONIA B [[id number]]   Atogepant  (QULIPTA ) 60 MG TABS    Sig: Take 1 tablet (60 mg total) by mouth daily.    Dispense:  90 tablet    Refill:  3    Supervising Provider:   INES ONETHA NOVAK [[id number]]     Follow Up Instructions:  I discussed the assessment and treatment plan with the patient. The patient was provided an opportunity to ask questions and all were answered. The patient agreed with the plan and demonstrated an understanding of the instructions.   The patient was advised to call back or seek an in-person evaluation if the symptoms worsen or if the condition fails to improve as anticipated.  I provided 30 minutes of non-face-to-face time during this encounter. Patient located at their place of residence during Mychart visit. Provider is in the office.    Tymeshia Awan, NP

## 2023-08-20 ENCOUNTER — Telehealth: Payer: 59 | Admitting: Family Medicine

## 2023-08-20 ENCOUNTER — Encounter: Payer: Self-pay | Admitting: Family Medicine

## 2023-08-20 DIAGNOSIS — G43909 Migraine, unspecified, not intractable, without status migrainosus: Secondary | ICD-10-CM

## 2023-08-20 DIAGNOSIS — G43109 Migraine with aura, not intractable, without status migrainosus: Secondary | ICD-10-CM | POA: Diagnosis not present

## 2023-08-20 MED ORDER — NURTEC 75 MG PO TBDP: 75.0000 mg | ORAL_TABLET | Freq: Every day | ORAL | 11 refills | Status: AC | PRN

## 2023-08-20 MED ORDER — TOPIRAMATE 100 MG PO TABS: 100.0000 mg | ORAL_TABLET | Freq: Two times a day (BID) | ORAL | 3 refills | Status: AC

## 2023-08-20 MED ORDER — QULIPTA 60 MG PO TABS: 60.0000 mg | ORAL_TABLET | Freq: Every day | ORAL | 3 refills | Status: AC

## 2023-10-27 ENCOUNTER — Encounter: Payer: Self-pay | Admitting: Family Medicine

## 2023-10-30 ENCOUNTER — Encounter: Payer: Self-pay | Admitting: Family Medicine

## 2023-11-06 ENCOUNTER — Encounter: Payer: Self-pay | Admitting: Family Medicine

## 2024-02-03 ENCOUNTER — Ambulatory Visit: Admitting: Family Medicine

## 2024-02-10 NOTE — Progress Notes (Unsigned)
 [facility] [address] [place], [place] [address] [phone]  Date:  02/13/2024   Name:  Kylie Galloway   DOB:  February 08, 1975   MRN:  [id number]  PCP:  Watt Harlene BROCKS, MD    Chief Complaint: No chief complaint on file.   History of Present Illness:  Kylie Galloway is a 49 y.o. very pleasant female patient who presents with the following:  Pt seen today with concern of not feeling well-concern of weight gain and not sleeping well Last seen by me in July when she was having a severe headache-history of prediabetes and hypertension  Discussed the use of AI scribe software for clinical note transcription with the patient, who gave verbal consent to proceed.  History of Present Illness Kylie Galloway is a 49 year old female who presents with fatigue, anxiety, and weight gain.  She experiences persistent fatigue and has noticed her fingers and toes turning purple in cold conditions. She associates these symptoms with potential signs of diabetes. Over the past three to four months, she has gained approximately 20 to 30 pounds without significant changes in her diet or exercise routine. She attributes some of the weight gain to being unable to exercise outside due to the weather.  She has increased anxiety, describing it as 'going crazy,' and is seeing a counselor weekly. Her counselor suggested that adult ADHD might be contributing to her anxiety, noting that she is 'fidgety all the time.' She has been on venlafaxine  75 mg for anxiety; this seemed to work pretty well for her until recently.  She would interested in trying a higher dose and seeing if that might work   She is on a progesterone containing IUD-we have tried to stay away from estrogen due to her blood pressure.. She has not had her thyroid  checked in a year and a half. She also reports some vaginal dryness.  She does not menstruate regularly due to her IUD, we wonder  if she might be experiencing perimenopause  She has previously used weight loss medications, including Ozempic  and Mounjaro, which she obtained offline. She is concerned about the cost and insurance coverage for these medications. She has no personal or family history of thyroid  cancer or multiple endocrine neoplasia syndrome.  Her family history is limited as her mother passed away [place] and had an early hysterectomy, leaving her with little information about menopause onset in her family.  Wt Readings from Last 3 Encounters:  02/13/24 193 lb (87.5 kg)  07/31/23 165 lb (74.8 kg)  07/29/23 172 lb 6.4 oz (78.2 kg)     Patient Active Problem List   Diagnosis Date Noted   Acute non-recurrent pansinusitis 11/06/2021   Prediabetes 11/11/2019   Pneumonia due to COVID-19 virus 01/02/2019   Respiratory failure with hypoxia (HCC) 01/02/2019   Ingrown toenail 10/26/2017   Migraine 12/25/2015   Hypertension 01/24/2012    Past Medical History:  Diagnosis Date   Allergy    Anxiety    H/O Bell's palsy    rt sided   Hypertension    Migraine aura, persistent     Past Surgical History:  Procedure Laterality Date   BREAST BIOPSY Right 02/25/2020   CESAREAN SECTION  01/09/2008    Social History[1]  Family History  Problem Relation Age of Onset   Diabetes Maternal Grandmother    Diabetes Maternal Grandfather    CAD Paternal Grandmother 34  CAD Paternal Grandfather 91   Hypertension Brother    BRCA 1/2 Neg Hx    Breast cancer Neg Hx     Allergies[2]  Medication list has been reviewed and updated.  Medications Ordered Prior to Encounter[3]  Review of Systems:  As per HPI- otherwise negative.   Physical Examination: Vitals:   02/13/24 1600  BP: 139/78  Pulse: 80  Resp: 16  Temp: 98.8 F (37.1 C)  SpO2: 100%   Vitals:   02/13/24 1600  Weight: 193 lb (87.5 kg)  Height: 5' 5 (1.651 m)   Body mass index is 32.12 kg/m. Ideal Body Weight: Weight in (lb) to have  BMI = 25: 149.9  GEN: no acute distress.  Mildly obese, looks well but is more anxious than usual HEENT: Atraumatic, Normocephalic. Bilateral TM wnl, oropharynx normal.  PEERL,EOMI.   Ears and Nose: No external deformity. CV: RRR, No M/G/R. No JVD. No thrill. No extra heart sounds. PULM: CTA B, no wheezes, crackles, rhonchi. No retractions. No resp. distress. No accessory muscle use. EXTR: No c/c/e PSYCH: Normally interactive. Conversant.    Assessment and Plan: Essential hypertension - Plan: CBC, Comprehensive metabolic panel with GFR, semaglutide -weight management (WEGOVY ) 0.25 MG/0.5ML SOAJ SQ injection  Current moderate episode of major depressive disorder without prior episode (HCC) - Plan: venlafaxine  XR (EFFEXOR -XR) 150 MG 24 hr capsule  Weight gain - Plan: TSH, FSH, semaglutide -weight management (WEGOVY ) 0.25 MG/0.5ML SOAJ SQ injection  Screening, lipid - Plan: Lipid panel  Prediabetes - Plan: Comprehensive metabolic panel with GFR, Hemoglobin A1c  Class 1 obesity with serious comorbidity and body mass index (BMI) of 32.0 to 32.9 in adult, unspecified obesity type - Plan: semaglutide -weight management (WEGOVY ) 0.25 MG/0.5ML SOAJ SQ injection  Assessment & Plan Abnormal weight gain Weight gain of 20-30 pounds over 3-4 months. Possible thyroid  dysfunction, perimenopausal changes, and emotional factors. Previous GLP-1 agonist use with success. Insurance may not cover weight loss medications without diabetes but we can try. - Ordered thyroid  function and diabetes blood work. - Prescribed Wegovy , referred to GLP-1 team for insurance evaluation. - Consider direct-to-patient pharmacy for Wegovy  if insurance denies.  Adjustment disorder with anxious mood Increased anxiety possibly due to routine disruptions and perimenopausal changes. Current venlafaxine  dose may be insufficient. - Increased venlafaxine  to 150 mg daily. - Continue weekly counseling.  Essential  hypertension Hypertension managed with current medications.  Perimenopausal disorder Symptoms include weight gain, anxiety, vaginal dryness. IUD provides progesterone but not estrogen. - Ordered [facility] test for perimenopausal evaluation.  Suspected thyroid  disorder Possible thyroid  dysfunction contributing to weight gain and anxiety. - Ordered thyroid  function tests.  Prediabetes No diabetes evidence, but prediabetes present. Weight management and lifestyle modifications crucial. - Ordered blood work to assess for diabetes.  Signed Harlene Schroeder, MD     [1]  Social History Tobacco Use   Smoking status: Never   Smokeless tobacco: Never  Vaping Use   Vaping status: Never Used  Substance Use Topics   Alcohol use: No   Drug use: No  [2] No Known Allergies [3]  Current Outpatient Medications on File Prior to Visit  Medication Sig Dispense Refill   acetaminophen  (TYLENOL ) 500 MG tablet Take 1,000 mg by mouth every 6 (six) hours as needed for mild pain or fever.     albuterol  (VENTOLIN  HFA) 108 (90 Base) MCG/ACT inhaler Inhale 2 puffs into the lungs every 6 (six) hours as needed for wheezing or shortness of breath. 18 g 5   Apple Cider Vinegar  500 MG TABS Take 500 mg by mouth daily.     Atogepant  (QULIPTA ) 60 MG TABS Take 1 tablet (60 mg total) by mouth daily. 90 tablet 3   bisoprolol  (ZEBETA ) 5 MG tablet Take 1 tablet (5 mg total) by mouth daily. 90 tablet 3   cholecalciferol (VITAMIN D3) 25 MCG (1000 UT) tablet Take 1,000 Units by mouth daily.     fexofenadine  (ALLEGRA ) 180 MG tablet Take 1 tablet (180 mg total) by mouth daily. Use as needed for allergies 90 tablet 3   levonorgestrel (MIRENA, 52 MG,) 20 MCG/DAY IUD Mirena 21 mcg/24 hours (8 yrs) 52 mg intrauterine device  Take 1 device by intrauterine route.     meloxicam (MOBIC) 15 MG tablet Take 15 mg by mouth daily.     Omega-3 Fatty Acids (FISH OIL) 1000 MG CAPS Take 1,000 mg by mouth daily.      polyethylene glycol  (MIRALAX  / GLYCOLAX ) 17 g packet Take 17 g by mouth daily. 90 packet 3   Rimegepant Sulfate (NURTEC) 75 MG TBDP Take 1 tablet (75 mg total) by mouth daily as needed (take for abortive therapy of migraine, no more than 1 tablet in 24 hours or 10 per month). 10 tablet 11   silver  sulfADIAZINE  (SILVADENE ) 1 % cream Apply 1 application topically daily. 50 g 0   topiramate  (TOPAMAX ) 100 MG tablet Take 1 tablet (100 mg total) by mouth 2 (two) times daily. 180 tablet 3   triamterene -hydrochlorothiazide  (MAXZIDE-25) 37.5-25 MG tablet Take 1 tablet by mouth daily. Use as needed for leg swelling 90 tablet 3   vitamin B-12 (CYANOCOBALAMIN) 1000 MCG tablet Take 1,000 mcg by mouth daily.     vitamin C (ASCORBIC ACID ) 500 MG tablet Take 1,000 mg by mouth daily.      No current facility-administered medications on file prior to visit.   "

## 2024-02-13 ENCOUNTER — Ambulatory Visit: Admitting: Family Medicine

## 2024-02-13 VITALS — BP 139/78 | HR 80 | Temp 98.8°F | Resp 16 | Ht 65.0 in | Wt 193.0 lb

## 2024-02-13 DIAGNOSIS — R635 Abnormal weight gain: Secondary | ICD-10-CM

## 2024-02-13 DIAGNOSIS — I10 Essential (primary) hypertension: Secondary | ICD-10-CM

## 2024-02-13 DIAGNOSIS — R7303 Prediabetes: Secondary | ICD-10-CM

## 2024-02-13 DIAGNOSIS — E66811 Obesity, class 1: Secondary | ICD-10-CM

## 2024-02-13 DIAGNOSIS — Z13220 Encounter for screening for lipoid disorders: Secondary | ICD-10-CM

## 2024-02-13 DIAGNOSIS — F321 Major depressive disorder, single episode, moderate: Secondary | ICD-10-CM

## 2024-02-13 MED ORDER — VENLAFAXINE HCL ER 150 MG PO CP24: 150.0000 mg | ORAL_CAPSULE | Freq: Every day | ORAL | 3 refills | Status: AC

## 2024-02-13 MED ORDER — WEGOVY 0.25 MG/0.5ML ~~LOC~~ SOAJ: 0.2500 mg | SUBCUTANEOUS | 2 refills | Status: AC

## 2024-02-13 NOTE — Patient Instructions (Signed)
 Good to see you - we will increase effexor  to 150 I will try and get the wegovy  approved for you!

## 2024-02-14 ENCOUNTER — Encounter: Payer: Self-pay | Admitting: Family Medicine

## 2024-02-14 LAB — COMPREHENSIVE METABOLIC PANEL WITH GFR
ALT: 21 U/L (ref 3–35)
AST: 19 U/L (ref 5–37)
Albumin: 4.6 g/dL (ref 3.5–5.2)
Alkaline Phosphatase: 49 U/L (ref 39–117)
BUN: 16 mg/dL (ref 6–23)
CO2: 28 meq/L (ref 19–32)
Calcium: 9.9 mg/dL (ref 8.4–10.5)
Chloride: 102 meq/L (ref 96–112)
Creatinine, Ser: 0.91 mg/dL (ref 0.40–1.20)
GFR: 75.84 mL/min
Glucose, Bld: 111 mg/dL — ABNORMAL HIGH (ref 70–99)
Potassium: 4 meq/L (ref 3.5–5.1)
Sodium: 140 meq/L (ref 135–145)
Total Bilirubin: 0.4 mg/dL (ref 0.2–1.2)
Total Protein: 7.6 g/dL (ref 6.0–8.3)

## 2024-02-14 LAB — CBC
HCT: 43.7 % (ref 36.0–46.0)
Hemoglobin: 15 g/dL (ref 12.0–15.0)
MCHC: 34.2 g/dL (ref 30.0–36.0)
MCV: 82.7 fl (ref 78.0–100.0)
Platelets: 302 10*3/uL (ref 150.0–400.0)
RBC: 5.29 Mil/uL — ABNORMAL HIGH (ref 3.87–5.11)
RDW: 14 % (ref 11.5–15.5)
WBC: 10.2 10*3/uL (ref 4.0–10.5)

## 2024-02-14 LAB — LIPID PANEL
Cholesterol: 212 mg/dL — ABNORMAL HIGH (ref 28–200)
HDL: 52.6 mg/dL
LDL Cholesterol: 115 mg/dL — ABNORMAL HIGH (ref 10–99)
NonHDL: 159.17
Total CHOL/HDL Ratio: 4
Triglycerides: 221 mg/dL — ABNORMAL HIGH (ref 10.0–149.0)
VLDL: 44.2 mg/dL — ABNORMAL HIGH (ref 0.0–40.0)

## 2024-02-14 LAB — HEMOGLOBIN A1C: Hgb A1c MFr Bld: 5.7 % (ref 4.6–6.5)

## 2024-02-14 LAB — TSH: TSH: 2.41 u[IU]/mL (ref 0.35–5.50)

## 2024-02-14 LAB — FOLLICLE STIMULATING HORMONE: FSH: 7.1 m[IU]/mL

## 2024-02-27 ENCOUNTER — Telehealth: Admitting: Family Medicine

## 2024-03-02 ENCOUNTER — Telehealth: Admitting: Family Medicine
